# Patient Record
Sex: Female | Born: 1975 | Race: White | Hispanic: No | Marital: Married | Smoking: Never smoker
Health system: Southern US, Community
[De-identification: ages and names within clinical notes are randomized; demographics above are authoritative.]

## PROBLEM LIST (undated history)

## (undated) DIAGNOSIS — F909 Attention-deficit hyperactivity disorder, unspecified type: Secondary | ICD-10-CM

## (undated) DIAGNOSIS — K600 Acute anal fissure: Secondary | ICD-10-CM

## (undated) DIAGNOSIS — Z8052 Family history of malignant neoplasm of bladder: Secondary | ICD-10-CM

## (undated) DIAGNOSIS — G40909 Epilepsy, unspecified, not intractable, without status epilepticus: Secondary | ICD-10-CM

## (undated) DIAGNOSIS — F429 Obsessive-compulsive disorder, unspecified: Secondary | ICD-10-CM

## (undated) DIAGNOSIS — D649 Anemia, unspecified: Secondary | ICD-10-CM

## (undated) DIAGNOSIS — K5904 Chronic idiopathic constipation: Secondary | ICD-10-CM

## (undated) DIAGNOSIS — F419 Anxiety disorder, unspecified: Secondary | ICD-10-CM

## (undated) DIAGNOSIS — F32A Depression, unspecified: Secondary | ICD-10-CM

## (undated) DIAGNOSIS — Z8041 Family history of malignant neoplasm of ovary: Secondary | ICD-10-CM

## (undated) DIAGNOSIS — K602 Anal fissure, unspecified: Principal | ICD-10-CM

## (undated) DIAGNOSIS — K219 Gastro-esophageal reflux disease without esophagitis: Secondary | ICD-10-CM

## (undated) DIAGNOSIS — R569 Unspecified convulsions: Secondary | ICD-10-CM

## (undated) DIAGNOSIS — Z803 Family history of malignant neoplasm of breast: Secondary | ICD-10-CM

## (undated) DIAGNOSIS — K589 Irritable bowel syndrome without diarrhea: Secondary | ICD-10-CM

## (undated) DIAGNOSIS — R630 Anorexia: Secondary | ICD-10-CM

## (undated) HISTORY — DX: Anxiety disorder, unspecified: F41.9

## (undated) HISTORY — DX: Unspecified convulsions: R56.9

## (undated) HISTORY — DX: Obsessive-compulsive disorder, unspecified: F42.9

## (undated) HISTORY — DX: Anal fissure, unspecified: K60.2

## (undated) HISTORY — DX: Family history of malignant neoplasm of ovary: Z80.41

## (undated) HISTORY — DX: Epilepsy, unspecified, not intractable, without status epilepticus: G40.909

## (undated) HISTORY — DX: Gastro-esophageal reflux disease without esophagitis: K21.9

## (undated) HISTORY — PX: TONSILLECTOMY: SUR1361

## (undated) HISTORY — DX: Chronic idiopathic constipation: K59.04

## (undated) HISTORY — DX: Anemia, unspecified: D64.9

## (undated) HISTORY — DX: Anorexia: R63.0

## (undated) HISTORY — DX: Family history of malignant neoplasm of breast: Z80.3

## (undated) HISTORY — DX: Attention-deficit hyperactivity disorder, unspecified type: F90.9

## (undated) HISTORY — DX: Acute anal fissure: K60.0

## (undated) HISTORY — DX: Family history of malignant neoplasm of bladder: Z80.52

## (undated) HISTORY — DX: Irritable bowel syndrome, unspecified: K58.9

## (undated) HISTORY — PX: LIPOSUCTION TRUNK: SUR833

---

## 1999-09-14 ENCOUNTER — Emergency Department (HOSPITAL_COMMUNITY): Admission: EM | Admit: 1999-09-14 | Discharge: 1999-09-14 | Payer: Self-pay | Admitting: Emergency Medicine

## 2001-12-13 ENCOUNTER — Other Ambulatory Visit: Admission: RE | Admit: 2001-12-13 | Discharge: 2001-12-13 | Payer: Self-pay | Admitting: Obstetrics and Gynecology

## 2002-07-20 ENCOUNTER — Inpatient Hospital Stay (HOSPITAL_COMMUNITY): Admission: AD | Admit: 2002-07-20 | Discharge: 2002-07-23 | Payer: Self-pay | Admitting: Obstetrics and Gynecology

## 2002-07-26 ENCOUNTER — Encounter: Admission: RE | Admit: 2002-07-26 | Discharge: 2002-08-25 | Payer: Self-pay | Admitting: Obstetrics and Gynecology

## 2004-09-02 ENCOUNTER — Inpatient Hospital Stay (HOSPITAL_COMMUNITY): Admission: AD | Admit: 2004-09-02 | Discharge: 2004-09-02 | Payer: Self-pay | Admitting: Obstetrics and Gynecology

## 2004-11-26 ENCOUNTER — Inpatient Hospital Stay (HOSPITAL_COMMUNITY): Admission: AD | Admit: 2004-11-26 | Discharge: 2004-11-26 | Payer: Self-pay | Admitting: *Deleted

## 2004-12-01 ENCOUNTER — Ambulatory Visit (HOSPITAL_COMMUNITY): Admission: RE | Admit: 2004-12-01 | Discharge: 2004-12-01 | Payer: Self-pay | Admitting: Neurology

## 2005-03-09 ENCOUNTER — Inpatient Hospital Stay (HOSPITAL_COMMUNITY): Admission: AD | Admit: 2005-03-09 | Discharge: 2005-03-11 | Payer: Self-pay | Admitting: Obstetrics and Gynecology

## 2005-03-12 ENCOUNTER — Encounter: Admission: RE | Admit: 2005-03-12 | Discharge: 2005-03-19 | Payer: Self-pay | Admitting: Obstetrics and Gynecology

## 2006-03-30 ENCOUNTER — Ambulatory Visit: Payer: Self-pay | Admitting: Gastroenterology

## 2006-03-31 ENCOUNTER — Ambulatory Visit: Payer: Self-pay | Admitting: Gastroenterology

## 2006-03-31 ENCOUNTER — Ambulatory Visit (HOSPITAL_COMMUNITY): Admission: RE | Admit: 2006-03-31 | Discharge: 2006-03-31 | Payer: Self-pay | Admitting: Gastroenterology

## 2006-04-06 ENCOUNTER — Ambulatory Visit: Payer: Self-pay | Admitting: Gastroenterology

## 2009-08-15 ENCOUNTER — Inpatient Hospital Stay (HOSPITAL_COMMUNITY): Admission: RE | Admit: 2009-08-15 | Discharge: 2009-08-17 | Payer: Self-pay | Admitting: Obstetrics and Gynecology

## 2010-02-15 ENCOUNTER — Encounter: Payer: Self-pay | Admitting: Neurology

## 2010-04-12 LAB — CBC
HCT: 30.5 % — ABNORMAL LOW (ref 36.0–46.0)
HCT: 30.5 % — ABNORMAL LOW (ref 36.0–46.0)
Hemoglobin: 10.4 g/dL — ABNORMAL LOW (ref 12.0–15.0)
Hemoglobin: 10.6 g/dL — ABNORMAL LOW (ref 12.0–15.0)
MCH: 31.3 pg (ref 26.0–34.0)
MCH: 31.5 pg (ref 26.0–34.0)
MCHC: 34.3 g/dL (ref 30.0–36.0)
MCHC: 34.9 g/dL (ref 30.0–36.0)
MCV: 90.1 fL (ref 78.0–100.0)
MCV: 91.3 fL (ref 78.0–100.0)
Platelets: 186 10*3/uL (ref 150–400)
Platelets: 195 10*3/uL (ref 150–400)
RBC: 3.34 MIL/uL — ABNORMAL LOW (ref 3.87–5.11)
RBC: 3.39 MIL/uL — ABNORMAL LOW (ref 3.87–5.11)
RDW: 12.1 % (ref 11.5–15.5)
RDW: 12.4 % (ref 11.5–15.5)
WBC: 16.4 10*3/uL — ABNORMAL HIGH (ref 4.0–10.5)
WBC: 9.8 10*3/uL (ref 4.0–10.5)

## 2010-04-12 LAB — RPR: RPR Ser Ql: NONREACTIVE

## 2010-05-18 ENCOUNTER — Encounter: Payer: Self-pay | Admitting: Internal Medicine

## 2010-05-18 ENCOUNTER — Inpatient Hospital Stay (HOSPITAL_COMMUNITY)
Admission: EM | Admit: 2010-05-18 | Discharge: 2010-05-19 | DRG: 247 | Disposition: A | Discharge status: Home / Self Care | Payer: BC Managed Care – PPO | Attending: Internal Medicine | Admitting: Internal Medicine

## 2010-05-18 ENCOUNTER — Emergency Department (HOSPITAL_COMMUNITY): Payer: BC Managed Care – PPO

## 2010-05-18 DIAGNOSIS — B343 Parvovirus infection, unspecified: Secondary | ICD-10-CM | POA: Diagnosis present

## 2010-05-18 DIAGNOSIS — R21 Rash and other nonspecific skin eruption: Secondary | ICD-10-CM | POA: Diagnosis present

## 2010-05-18 DIAGNOSIS — M7989 Other specified soft tissue disorders: Secondary | ICD-10-CM | POA: Diagnosis present

## 2010-05-18 DIAGNOSIS — D649 Anemia, unspecified: Secondary | ICD-10-CM | POA: Diagnosis present

## 2010-05-18 DIAGNOSIS — F909 Attention-deficit hyperactivity disorder, unspecified type: Secondary | ICD-10-CM | POA: Diagnosis present

## 2010-05-18 DIAGNOSIS — B349 Viral infection, unspecified: Secondary | ICD-10-CM | POA: Diagnosis present

## 2010-05-18 DIAGNOSIS — M2550 Pain in unspecified joint: Principal | ICD-10-CM | POA: Diagnosis present

## 2010-05-18 DIAGNOSIS — R197 Diarrhea, unspecified: Secondary | ICD-10-CM | POA: Diagnosis present

## 2010-05-18 DIAGNOSIS — R630 Anorexia: Secondary | ICD-10-CM

## 2010-05-18 DIAGNOSIS — K602 Anal fissure, unspecified: Secondary | ICD-10-CM

## 2010-05-18 DIAGNOSIS — F429 Obsessive-compulsive disorder, unspecified: Secondary | ICD-10-CM | POA: Insufficient documentation

## 2010-05-18 DIAGNOSIS — F419 Anxiety disorder, unspecified: Secondary | ICD-10-CM

## 2010-05-18 DIAGNOSIS — F411 Generalized anxiety disorder: Secondary | ICD-10-CM | POA: Diagnosis present

## 2010-05-18 LAB — DIFFERENTIAL
Basophils Absolute: 0 10*3/uL (ref 0.0–0.1)
Basophils Relative: 0 % (ref 0–1)
Eosinophils Absolute: 0.4 10*3/uL (ref 0.0–0.7)
Eosinophils Relative: 5 % (ref 0–5)
Lymphocytes Relative: 27 % (ref 12–46)
Lymphs Abs: 1.9 10*3/uL (ref 0.7–4.0)
Monocytes Absolute: 0.4 10*3/uL (ref 0.1–1.0)
Monocytes Relative: 6 % (ref 3–12)
Neutro Abs: 4.3 10*3/uL (ref 1.7–7.7)
Neutrophils Relative %: 62 % (ref 43–77)
WBC Morphology: INCREASED

## 2010-05-18 LAB — HEPATIC FUNCTION PANEL
ALT: 17 U/L (ref 0–35)
AST: 22 U/L (ref 0–37)
Albumin: 3.2 g/dL — ABNORMAL LOW (ref 3.5–5.2)
Alkaline Phosphatase: 76 U/L (ref 39–117)
Bilirubin, Direct: <0.1 mg/dL (ref 0.0–0.3)
Total Bilirubin: 0.2 mg/dL — ABNORMAL LOW (ref 0.3–1.2)
Total Protein: 6 g/dL (ref 6.0–8.3)

## 2010-05-18 LAB — BASIC METABOLIC PANEL
BUN: 7 mg/dL (ref 6–23)
CO2: 28 mEq/L (ref 19–32)
Calcium: 8 mg/dL — ABNORMAL LOW (ref 8.4–10.5)
Chloride: 106 mEq/L (ref 96–112)
Creatinine, Ser: 0.66 mg/dL (ref 0.4–1.2)
GFR calc Af Amer: >60 mL/min (ref >60)
GFR calc non Af Amer: >60 mL/min (ref >60)
Glucose, Bld: 95 mg/dL (ref 70–99)
Potassium: 3.6 mEq/L (ref 3.5–5.1)
Sodium: 139 mEq/L (ref 135–145)

## 2010-05-18 LAB — RETICULOCYTES
RBC.: 3.73 MIL/uL — ABNORMAL LOW (ref 3.87–5.11)
Retic Count, Absolute: 3.7 10*3/uL — ABNORMAL LOW (ref 19.0–186.0)
Retic Ct Pct: 0.1 % — ABNORMAL LOW (ref 0.4–3.1)

## 2010-05-18 LAB — URINALYSIS, ROUTINE W REFLEX MICROSCOPIC
Bilirubin Urine: NEGATIVE
Glucose, UA: NEGATIVE mg/dL
Hgb urine dipstick: NEGATIVE
Ketones, ur: NEGATIVE mg/dL
Nitrite: NEGATIVE
Protein, ur: NEGATIVE mg/dL
Specific Gravity, Urine: 1.019 (ref 1.005–1.030)
Urobilinogen, UA: 0.2 mg/dL (ref 0.0–1.0)
pH: 6 (ref 5.0–8.0)

## 2010-05-18 LAB — CBC
HCT: 33.1 % — ABNORMAL LOW (ref 36.0–46.0)
Hemoglobin: 11.4 g/dL — ABNORMAL LOW (ref 12.0–15.0)
MCH: 30.2 pg (ref 26.0–34.0)
MCHC: 34.4 g/dL (ref 30.0–36.0)
MCV: 87.8 fL (ref 78.0–100.0)
Platelets: 175 10*3/uL (ref 150–400)
RBC: 3.77 MIL/uL — ABNORMAL LOW (ref 3.87–5.11)
RDW: 12.3 % (ref 11.5–15.5)
WBC: 7 10*3/uL (ref 4.0–10.5)

## 2010-05-18 LAB — PREGNANCY, URINE: Preg Test, Ur: NEGATIVE

## 2010-05-18 LAB — RAPID URINE DRUG SCREEN, HOSP PERFORMED
Amphetamines: POSITIVE — AB
Barbiturates: NOT DETECTED
Benzodiazepines: NOT DETECTED
Cocaine: NOT DETECTED
Opiates: POSITIVE — AB
Tetrahydrocannabinol: NOT DETECTED

## 2010-05-18 LAB — FERRITIN: Ferritin: 58 ng/mL (ref 10–291)

## 2010-05-18 LAB — SEDIMENTATION RATE: Sed Rate: 5 mm/hr (ref 0–22)

## 2010-05-18 LAB — HCG, SERUM, QUALITATIVE: Preg, Serum: NEGATIVE

## 2010-05-18 LAB — APTT: aPTT: 31 seconds (ref 24–37)

## 2010-05-18 LAB — PROTIME-INR
INR: 1.04 (ref 0.00–1.49)
Prothrombin Time: 13.8 seconds (ref 11.6–15.2)

## 2010-05-18 LAB — TSH: TSH: 0.711 u[IU]/mL (ref 0.350–4.500)

## 2010-05-18 LAB — ETHANOL: Alcohol, Ethyl (B): <5 mg/dL (ref 0–10)

## 2010-05-18 IMAGING — CR DG CHEST 2V
2 series · 2 of 2 positions shown · non-contrast
Comparison: None.

CLINICAL DATA: Flu-like symptoms

CHEST - 2 VIEW

[w chest pa]
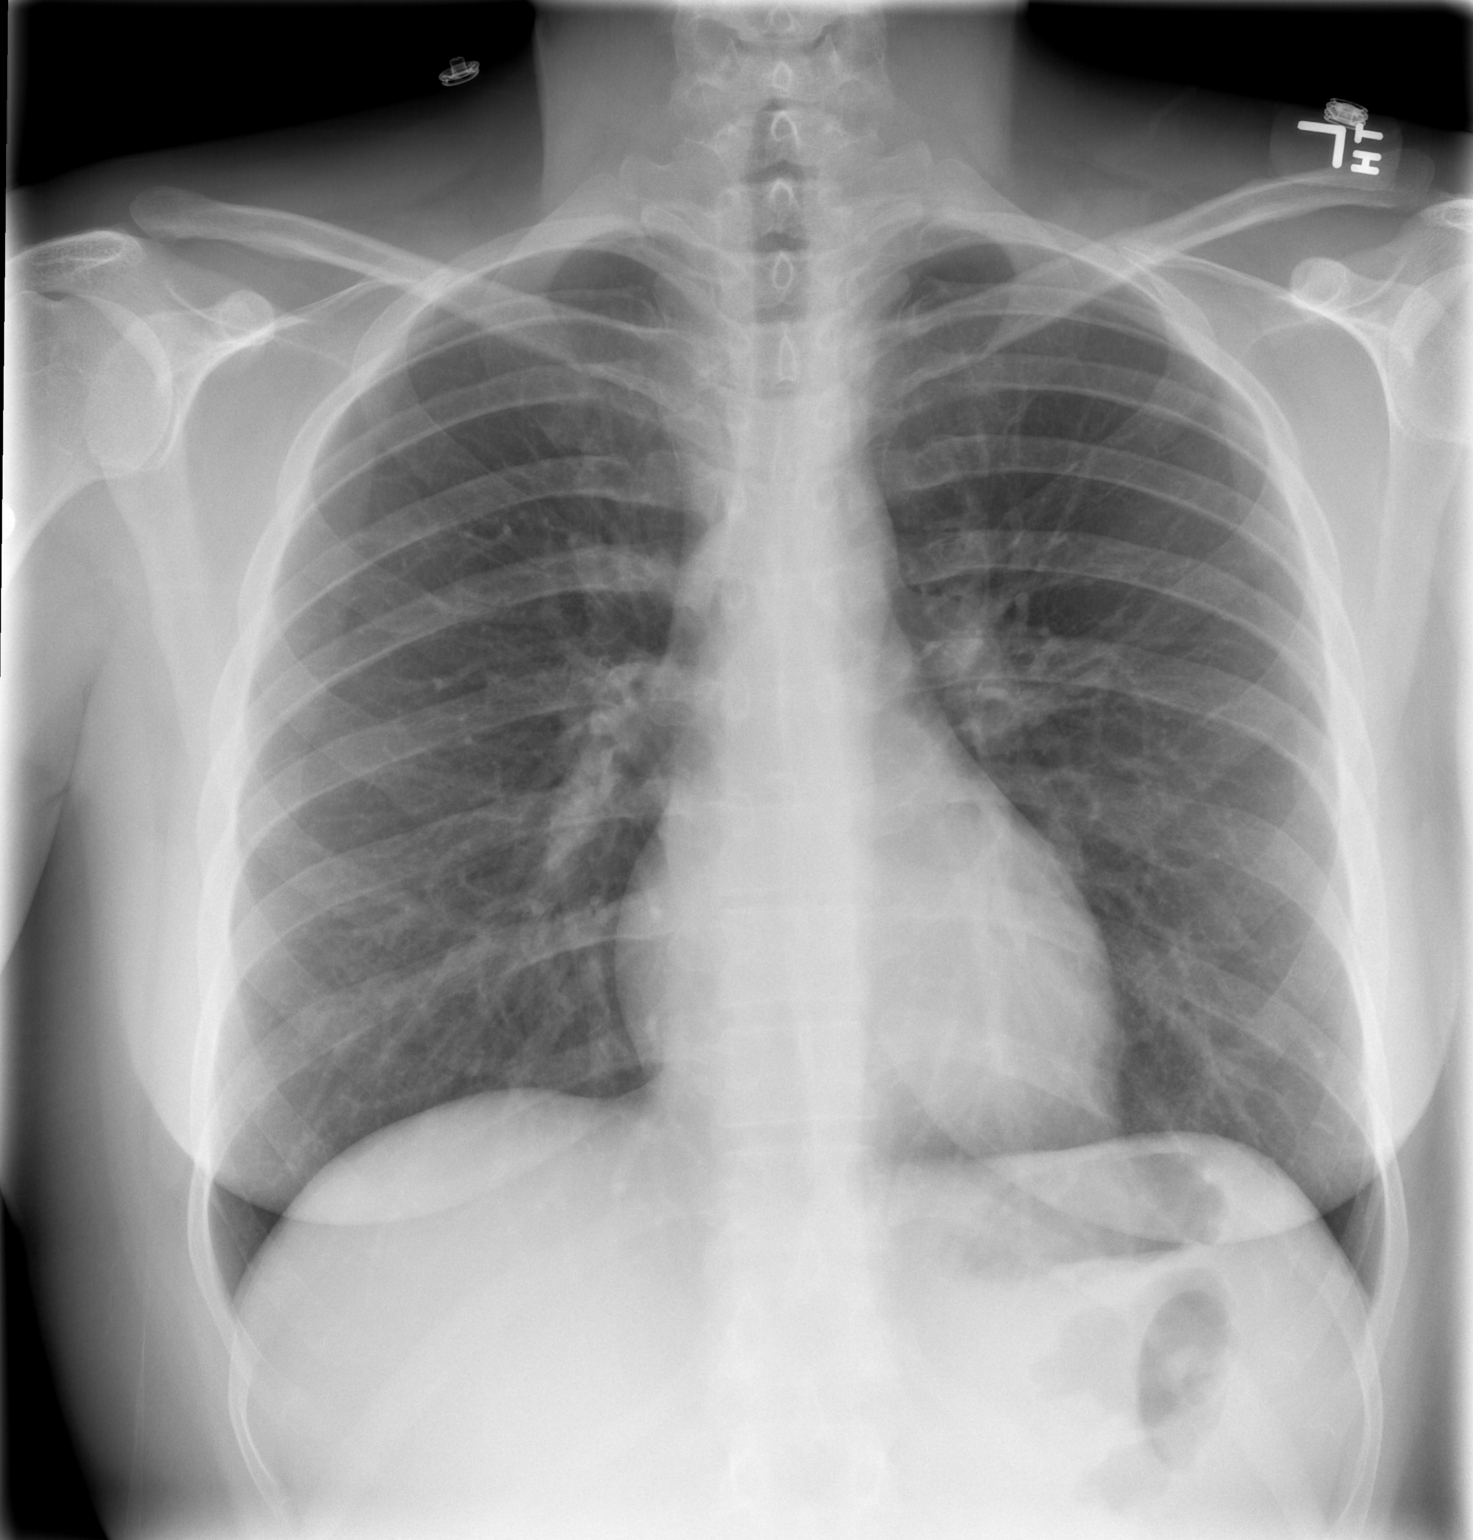

[w chest lat]
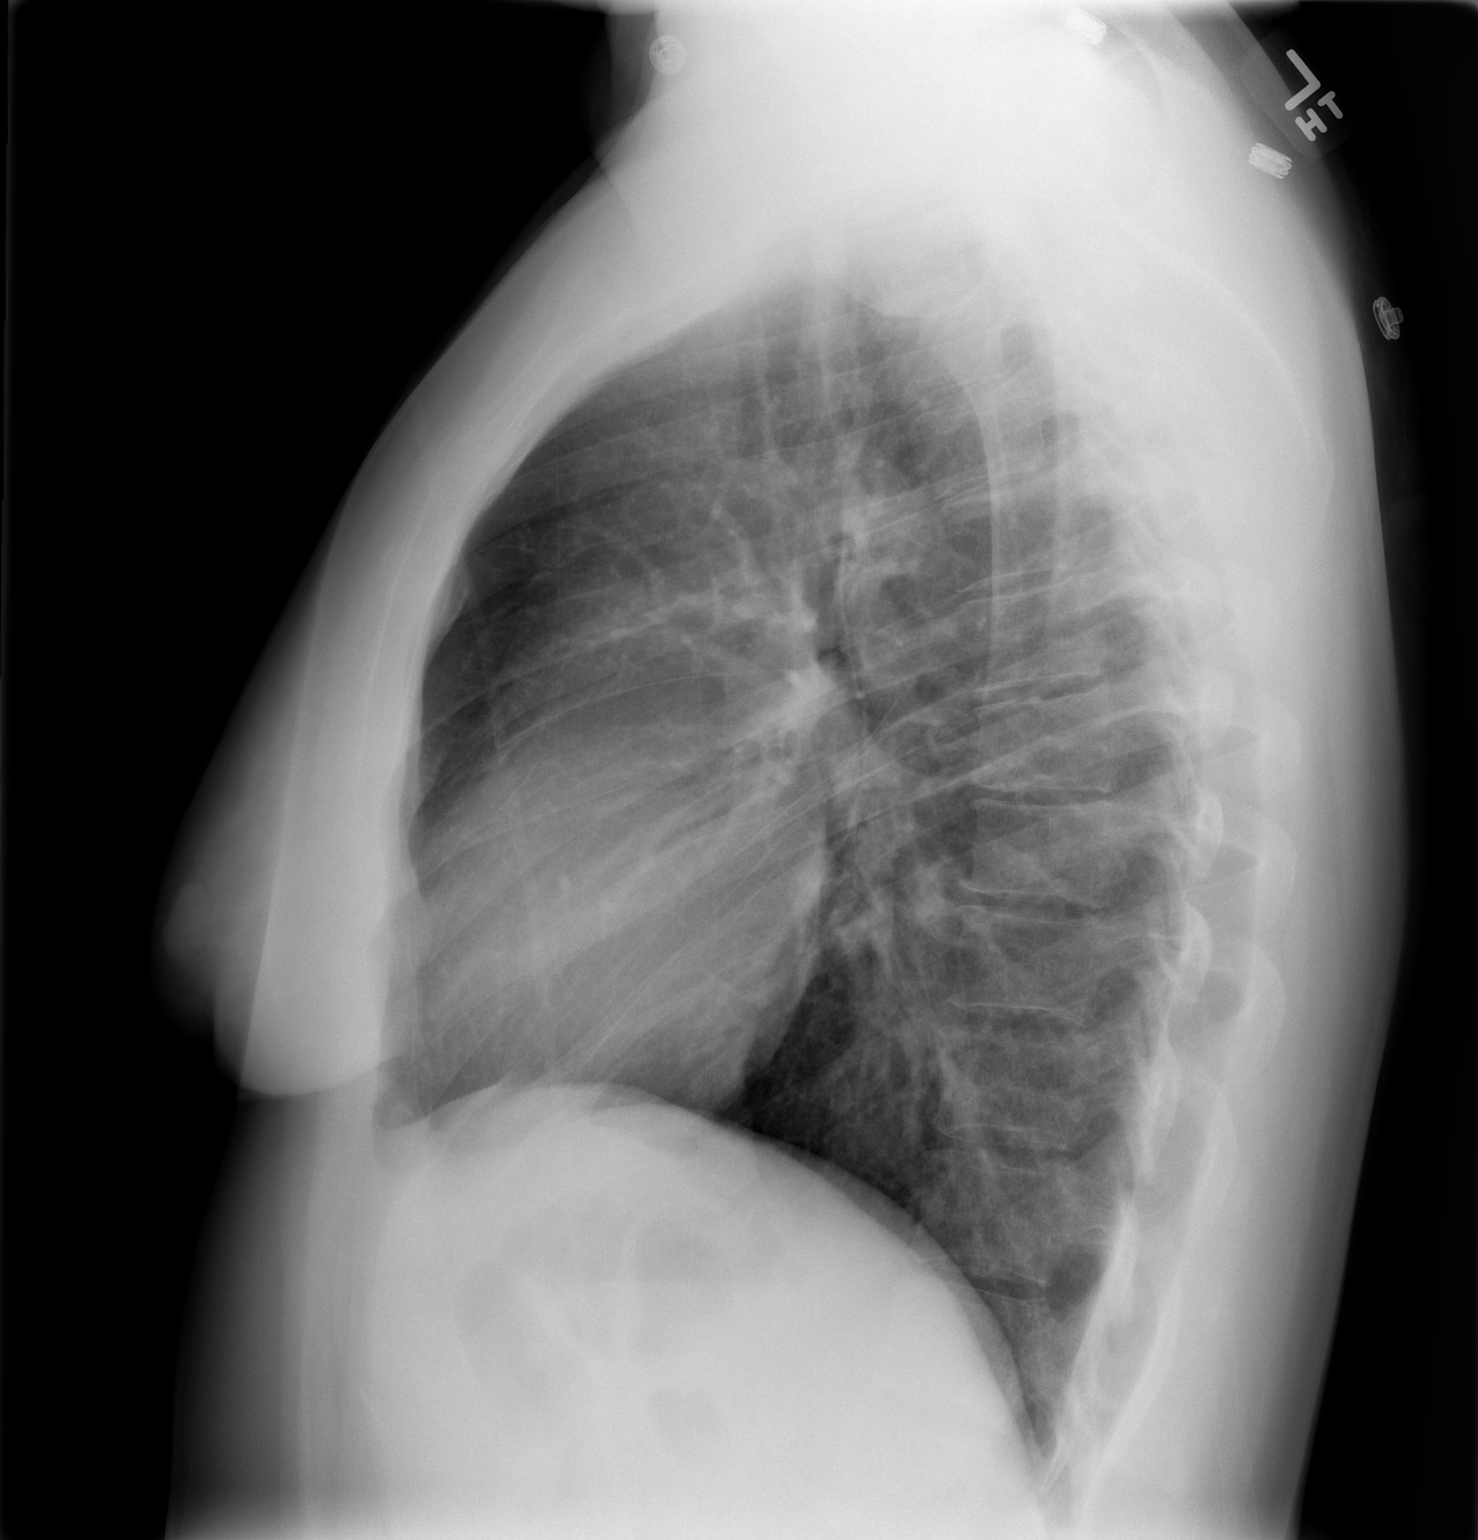

[2 of 2 positions shown; findings below may reference images not displayed]

FINDINGS: Normal heart size.  Clear lungs.
IMPRESSION: Negative.

## 2010-05-18 NOTE — H&P (Signed)
Hospital Admission Note Date of Admission: 05/18/2010  Patient name:   Julia [name]  Medical record number: [id number] Date of birth:  September 04, 1975  Age: 35 y.o. Gender: female PCP:    Darci Needle, M.D.  Medical Service:  Internal Medicine Teaching Service   Attending physician:  Dr. Ulyess Mort  First Contact:   Dr. Saralyn Pilar Pager: [phone]  Second Contact:   Dr. Denton Meek  Pager: [phone] After Hours:    First Contact  Pager: [phone]      Second Contact  Pager: [phone]   Chief Complaint:Painful, swollen hands and feet  History of Present Illness: Patient is a 34yo female with a PMHX of ADHD and anxiety, who presents to [facility] for evaluation of painful arthralgias and swelling of bilateral hands and feet that started on the night prior to admission, and which were preceded by 2 week history of flu-like illness. Illness started as a sore throat, which was followed by diffuse body aches, then onset of watery malodorous diarrhea (occuring 5-6 x daily). Single febrile episode to 100 F occuring 4 days ago. Patient also developed erythematous, "splotchy" pruritic rash over legs and trunk 3 days prior to admission, with resolution of rash on day prior to admission. 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 Patient was not overtly concerned about symptoms, until she developed the painful arthralgias and swelling of bilateral hands and feet on the night before admission, with associated cervical and thoracic spinal pain. Patient further confirms occasional headaches and nausea, but denies chills, vomiting, vision changes, focal weakness, recent travel outside of the country, recent known exposure to ticks. No recent trauma, falls.   Current Outpatient Medications: ALPRAZolam (XANAX) 0.25 MG tablet Take 0.25 mg by mouth at bedtime as needed.     amphetamine-dextroamphetamine  (ADDERALL XR) 30 MG 24 hr capsule Take 30 mg by mouth every morning.     amphetamine-dextroamphetamine (ADDERALL) 30 MG tablet Take 30 mg by mouth daily.       Allergies: Percocet - intolerance, nausea, vomiting  Past Medical History: Diagnosis Date  . Anxiety   . OCD (obsessive compulsive disorder)   . ADHD (attention deficit hyperactivity disorder)   . Anorexia     history of eating disorder    Past Surgical History: Procedure  . Liposuction trunk    Family History: Problem Relation  . Diabetes Mother  . Parkinsonism Mother  . Hypertension Father  . Rheum arthritis Maternal Grandfather    Social History: Social History  . Marital Status: Married    Number of Children: 3  . Years of Education: Completed college   Occupational History  . Full-time homemaker   Social History Main Topics  . Smoking status: Never Smoker   . Alcohol Use: 0.6 oz/week    1 Glasses of wine per week  . Drug Use: No  . Sexually Active: Yes    Birth Control/ Protection: None   Social History Narrative   Married. Has 3 children. A full time homemaker. Graduated from college with a degree in social work.Has a private health insurance through her spouse.    Review of Systems: Pertinent items are noted in HPI.  Vital Signs: T: 98 P: 76 BP: 128/81 RR: 18 O2 sat: 98% on RA   Physical Exam: General: Vital signs reviewed and noted. Well-developed, well-nourished, in no acute distress; alert, appropriate and cooperative throughout examination.  Head: Normocephalic,  atraumatic.  Eyes: PERRL, EOMI, No signs of anemia or jaundince.  Ears: TM nonerythematous, not bulging, good light reflex bilaterally.  Nose: Mucous membranes moist, not inflammed, nonerythematous.  Throat: Oropharynx nonerythematous, no exudate appreciated.   Neck: No deformities, masses, or tenderness noted. Supple, No carotid Bruits, no JVD. Rt posterior cervical   Lungs:  Normal respiratory effort. Clear to  auscultation BL without crackles or wheezes.  Heart: RRR. S1 and S2 normal without gallop, murmur, or rubs.  Abdomen:  BS normoactive. Soft, Nondistended, non-tender.  No masses or organomegaly.  Extremities: No pretibial edema.  Neurologic: A&O X3, CN II - XII are grossly intact. Negative Brudzinski sign.  Skin: Small reticulated, erythematous lesions with irregular borders on right lower lateral abdomen and under left breast approximately 1.5-2 cm in diameter.   Lab results: BMET: Sodium (NA)                              139               135-145          mEq/L Potassium (K)                            3.6               3.5-5.1          mEq/L Chloride                                 106               96-112           mEq/L CO2                                      28                19-32            mEq/L Glucose                                  95                70-99            mg/dL BUN                                      7                 6-23             mg/dL Creatinine                               0.66              0.4-1.2          mg/dL GFR, Est Non African American            >60               >  60              mL/min Calcium                                  8.0        l      8.4-10.5         mg/dL  Hepatic Function Panel: Bilirubin, Total                         0.2        l      0.3-1.2          mg/dL Bilirubin, Direct                        <0.1              0.0-0.3          mg/dL Indirect Bilirubin                       SEE NOTE.         0.3-0.9          mg/dL Alkaline Phosphatase                     76                39-117           U/L SGOT (AST)                               22                0-37             U/L SGPT (ALT)                               17                0-35             U/L Total  Protein                           6.0               6.0-8.3          g/dL Albumin-Blood                            3.2        l      3.5-5.2          G/dL  CBC: WBC                                       7.0               4.0-10.5         K/uL RBC  3.77       l      3.87-5.11        MIL/uL Hemoglobin (HGB)                         11.4       l      12.0-15.0        g/dL Hematocrit (HCT)                         33.1       l      36.0-46.0        % MCV                                      87.8              78.0-100.0       fL MCH -                                    30.2              26.0-34.0        pg MCHC                                     34.4              30.0-36.0        g/dL RDW                                      12.3              11.5-15.5        % Platelet Count (PLT)                     175               150-400          K/uL Neutrophils, %                           62                43-77            % Lymphocytes, %                           27                12-46            % Monocytes, %                             6                 3-12             % Eosinophils, %  5                 0-5              % Basophils, %                             0                 0-1              % Neutrophils, Absolute                    4.3               1.7-7.7          K/uL Lymphocytes, Absolute                    1.9               0.7-4.0          K/uL Monocytes, Absolute                      0.4               0.1-1.0          K/uL Eosinophils, Absolute                    0.4               0.0-0.7          K/uL Basophils, Absolute                      0.0               0.0-0.1          K/uL WBC Morphology                           INCREASED BANDS (>20% BANDS), TOXIC GRANULATION, ATYPICAL LYMPHOCYTES  ESR (Sedimentation Rate)                 5                 0-22             Mm/hr  HCG-Qualitative, Serum                   NEGATIVE          NEG  Alcohol                                  <5                0-10             mg/dL  Coagulation Studies: Protime ( Prothrombin Time)              13.8               11.6-15.2        seconds INR                                      1.04  0.00-1.49 PTT(a-Partial Thromboplastn Time)        31                24-37            Seconds  UA: Color, Urine                             YELLOW            YELLOW Appearance                               CLOUDY     a      CLEAR Specific Gravity                         1.019             1.005-1.030 pH                                       6.0               5.0-8.0 Urine Glucose                            NEGATIVE          NEG              mg/dL Bilirubin                                NEGATIVE          NEG Ketones                                  NEGATIVE          NEG              mg/dL Blood                                    NEGATIVE          NEG Protein                                  NEGATIVE          NEG              mg/dL Urobilinogen                             0.2               0.0-1.0          mg/dL Nitrite                                  NEGATIVE          NEG Leukocytes  NEGATIVE          NEG   Imaging results:  1) CXR - negative.  Assessment & Plan: 1) Painful arthralgias - patient's constellation of symptoms may represent parvovirus infection, particularly with recent exposure per daughter's classmates. Parvovirus infection would be consistent with presenting symptoms of nausea, myalgias, low-grade fever, arthralgias (which are present in 60% of adults with this infection, and typically affect the hands and feet, and present after onset of rash), and recent pruritic rash. Other differentials include Rubella, although pt recalls being uptodate on immunizations through her college career, an ddenies migratory arthralgias. No parotitis, or migratory polyarthritis to suggest mumps infection. No significant swelling or tenderness to palpation over joints, no high fevers to suggest septic arthritis, additionally, pt's symptoms are symmetric. Particularly with her family  history of autoimmune disease (RA), we must also consider entities such as RA and SLE.  SLE would be consistent with symmetric arthralgias of hands, mild anemia, headaches, GI upset perhaps precipitated after recent increased sun exposure. Given that symptoms have been ongoing < 6 weeks and we have not excluded viral arthropathy, RA diagnosis cannot be applied. Vasculitis less likely as ESR within normal limits.   Plan: - Check ANA, HIV Elisa, B. Burgdorferi Elisa, consider check Parvovirus IgM antibody - Throat culture - NSAIDS/ tylenol for pain relief - Monitor CBC for progression of anemia or evidence of thrombocytopenia  2) Diarrhea - unclear etiology, although may represent viral gastroenteritis in setting of #1 vs gastrointestinal manifestation of SLE. Patient otherwise without recent antibiotic usage. - Continue to hydrate patient - Monitor for leukocytosis, fevers - Consider check C.diff   3) Normocytic Anemia - baseline Hgb 10.5-12. May represent iron deficiency anemia with history of symptomatic rectal fissures causing rectal bleeding (has had prior sigmoidoscopy, although results unavailable at this time) versus anemia of chronic disease in setting of possible inflammatory disease.  - Check ferritin - Continue monitor H/H  4) ADHD - without active issues. - Hold home meds at this time, as we further evaluate source of arthralgias, and r/o vasculitis 2/2 methamphetamines.  - Then, will slowly resume home meds.   5) Anxiety - no suicidal or homicidal ideations.  - PRN Xanax  6) DVT PPX: Heparin     Johnette Abraham, D.O. (PGY1):  ____________________________________    Date/ Time:      ____________________________________     Deatra Robinson, M.D. (PGY2):   ____________________________________    Date/ Time:      ____________________________________     I have seen and examined the patient. I reviewed the resident/fellow note and agree with the findings and plan  of care as documented. My additions and revisions are included.   Signature:  ____________________________________________     Internal Medicine Teaching Service Attending    Date:    ____________________________________________

## 2010-05-19 DIAGNOSIS — B349 Viral infection, unspecified: Secondary | ICD-10-CM | POA: Diagnosis present

## 2010-05-19 LAB — ANA: Anti Nuclear Antibody(ANA): NEGATIVE

## 2010-05-19 LAB — CBC
HCT: 31.5 % — ABNORMAL LOW (ref 36.0–46.0)
Hemoglobin: 10.9 g/dL — ABNORMAL LOW (ref 12.0–15.0)
MCH: 30.2 pg (ref 26.0–34.0)
MCHC: 34.6 g/dL (ref 30.0–36.0)
MCV: 87.3 fL (ref 78.0–100.0)
Platelets: 207 10*3/uL (ref 150–400)
RBC: 3.61 MIL/uL — ABNORMAL LOW (ref 3.87–5.11)
RDW: 12 % (ref 11.5–15.5)
WBC: 14.4 10*3/uL — ABNORMAL HIGH (ref 4.0–10.5)

## 2010-05-19 LAB — STREP A DNA PROBE: Group A Strep Probe: NEGATIVE

## 2010-05-19 LAB — BASIC METABOLIC PANEL
BUN: 8 mg/dL (ref 6–23)
CO2: 25 mEq/L (ref 19–32)
Calcium: 8.4 mg/dL (ref 8.4–10.5)
Chloride: 108 mEq/L (ref 96–112)
Creatinine, Ser: 0.6 mg/dL (ref 0.4–1.2)
GFR calc Af Amer: >60 mL/min (ref >60)
GFR calc non Af Amer: >60 mL/min (ref >60)
Glucose, Bld: 135 mg/dL — ABNORMAL HIGH (ref 70–99)
Potassium: 3.6 mEq/L (ref 3.5–5.1)
Sodium: 140 mEq/L (ref 135–145)

## 2010-05-19 LAB — HIV ANTIBODY (ROUTINE TESTING W REFLEX): HIV: NONREACTIVE

## 2010-05-19 NOTE — Discharge Summary (Signed)
HOSPITAL DISCHARGE SUMMARY  DATE OF ADMISSION:        05/18/2010  DATE OF DISCHARGE:        05/19/2010   BRIEF HOSPITAL SUMMARY: Please see dictated discharge summary   STUDIES PENDING AT TIME OF DISCHARGE: ANA, HIV Antibody --> to be followed-up by PCP.   MEDICATIONS ON DISCHARGE: acetaminophen (TYLENOL) 325 MG tablet Take 650 mg by mouth every 6 (six) hours as needed.     ibuprofen (ADVIL,MOTRIN) 400 MG tablet Take 400 mg by mouth every 6 (six) hours as needed. To take with food    ondansetron (ZOFRAN) 4 MG tablet Take 4 mg by mouth every 6 (six) hours as needed.     amphetamine-dextroamphetamine (ADDERALL XR) 30 MG 24 hr capsule Take 30 mg by mouth every morning.     amphetamine-dextroamphetamine (ADDERALL) 30 MG tablet Take 30 mg by mouth daily.

## 2010-05-22 NOTE — Discharge Summary (Signed)
NAMELY, WASS                 ACCOUNT NO.:  [id number]  MEDICAL RECORD NO.:  [id number]           PATIENT TYPE:  I  LOCATION:  5011                         FACILITY:  [facility]  PHYSICIAN:  C. Ulyess Mort, M.D.DATE OF BIRTH:  10-Mar-1975  DATE OF ADMISSION:  05/18/2010 DATE OF DISCHARGE:  05/19/2010                              DISCHARGE SUMMARY   DISCHARGE DIAGNOSES: 1. Viral illness, possibly parvovirus. 2. Diarrhea, stable at discharge, likely viral. 3. Leukocytosis, likely secondary to IV steroids received in ED, the     patient remains afebrile. 4. Normocytic anemia, chronic since 2004. Baseline hemoglobin 10.5-12. Ferritin within normal limits. 5. Attention deficit hyperactivity disorder. 6. Anxiety.  DISCHARGE MEDICATIONS: 1. Acetaminophen 325 mg tabs, 650 mg by mouth every 6 hours as needed. 2. Ibuprofen 400 mg tabs, every 6 hours as needed to be taken with food. 3. Zofran 4 mg tabs, 1 tablet by mouth every 6 hours as needed for nausea or vomiting. 4. Adderall 30 mg tabs by mouth every evening as needed, prescribed by her psychiatrist. 5. Adderall XR 30 mg tabs, 2 tablets by mouth daily, prescribed by her psychiatrist.  DISPOSITION AND FOLLOWUP:   The patient is to follow up with her primary care physician, Dr. Darci Needle on Jun 02, 2010 at 1:30 p.m. at which time, CBC should be checked to evaluate the patient's anemia and evaluate for resolution of her leukocytosis. Further workup of her  normocytic anemia can also be completed, as indicated. Reassessment of presenting symptoms including arthralgias and diarrhea should be performed. Lastly, results of ANA and HIV Antibody tests, which were pending at the time of discharge, should be  refused.   PROCEDURES PERFORMED:   1) Chest x-ray - May 18, 2010 - negative.  CONSULTATIONS:  None.  BRIEF ADMITTING HISTORY AND PHYSICAL:  The patient is a 35 year old female with past medical history of ADHD and anxiety, who  presents to [facility] for evaluation of painful arthralgias and swelling of bilateral hands and feet that started on the night prior to admission and which were proceeded by a 2-week history of flu-like illness. Illness started as a sore throat, which was followed by diffuse body aches and onset of watery malodorous diarrhea occurring 5-6 times daily. Single febrile episode to 100 degrees Fahrenheit occurred 4 days prior to admission.  The patient also developed an erythematous, splotchy, pruritic rash over her legs and trunk 3 days prior to admission with resolution of the rash on the day prior to admission.  The patient attributed her symptoms to Still disease as there was a recent outbreak of this at her daughter's school for which the parents were notified. The patient was not overtly concerned about her symptoms until she developed a painful arthralgias and swelling of her bilateral hands and feet on the night prior to admission with associated cervical and thoracic pain.  The patient further confirmed occasional headaches and nausea, but denied chills, vomiting, visual changes, focal weakness, recent travel outside of the country, recent known exposure to ticks. No recent trauma or falls.  PHYSICAL EXAM ON ADMISSION:   VITAL SIGNS:  Temperature 98, pulse 76, blood pressure 128/81, respirations 18, O2 saturation 98% on room air. GENERAL:  Alert and oriented x3, well developed, well nourished, in no acute distress. HEENT:  Head:  Normocephalic, atraumatic.  Eyes:  PERRL, EOMI.  EARS: TMs nonerythematous, not bulging, light reflex bilaterally.  Mucous membranes moist, not inflamed, nonerythematous.  Throat:  Oropharynx non- erythematous.  No exudates appreciated. NECK:  Right posterior cervical lymphadenopathy noted of a single lymph node less than 1 cm. LUNGS:  Normal respiratory effort.  Clear to auscultation bilaterally. HEART:  Regular rate and rhythm.  No murmurs, rubs, or  gallops. ABDOMEN:  Soft, nontender, nondistended.  Normoactive bowel sounds. EXTREMITIES:  No pretibial edema.  Mild nonpitting edema over left lateral ankle.  NEUROLOGIC:  Alert and oriented x3.  Cranial nerves II through XII grossly intact.  Negative Brudzinski sign. SKIN:  Small reticulated, erythematous lesions with irregular borders on right lower lateral abdomen and under left breast approximately 1.5-2 cm in diameter.  LABS ON ADMISSION:   1) BMET:  Sodium 139, potassium 3.6, chloride 106, bicarb 28, BUN 7, creatinine 0.66, glucose 95.   2) Hepatic function panel: Total bilirubin 0.2, direct bilirubin less than 0.1, alkaline phosphatase 76, AST 22, ALT 17, total protein 6, albumin 3.2.   3) CBC:  WBC 7, hemoglobin 11.4, hematocrit 33.1, MCV 87.8, platelets 12.3, percent neutrophils 62, absolute neutrophils 4.3.  4)  ESR 5.   5) Beta HCG negative. 6) Alcohol less than 5.   7) Coagulation studies normal.   8) Urinalysis normal.  HOSPITAL COURSE BY PROBLEMS: 1. Viral illness, possibly parvovirus infection.  The patient's     constellation of symptoms involving painful arthralgias of the     hands and feet, pruritic rash, myalgias, nausea, low-grade fever     are consistent with viral infection, possibly parvovirus infection.  Exposure may     have been secondary to recent outbreak at her daughter's school.     The patient noted improvement of her pain during the hospital     course.  There was no evidence of high fevers, excessive     leukocytosis, swelling or significant tenderness to palpation of     her joints to suggest a septic arthritis, as well her ESR was     within normal limits.  The patient does have a family history of     autoimmune disease.  Therefore, autoimmune component is possible     with SLE being consistent with symmetric arthralgias of hands, mild     anemia, headaches, and possible GI upset; however, may not have     presented as acutely of the patient's  presenting symptoms.  ANA is     pending at the time of discharge.  The patient did not show     evidence of aplastic anemia during the hospital course, therefore     she was only managed supportively and provided NSAIDs for her     arthralgias.  She will have continued monitoring of her CBC as an     outpatient to assess for such symptoms and laboratory findings. 2. Diarrhea, unclear etiology, although may represent a viral     gastroenteritis in the setting of viral illness versus     gastrointestinal manifestation of SLE.  The patient did not have     significant leukocytosis or fevers on admission.  Labs did not     suggest significant volume depletion.  Therefore, she was just  managed supportively. 3. Normocytic anemia, chronic baseline hemoglobin of 10.5-12.  May represent     an iron-deficiency anemia with history of symptomatic rectal     fissures causing rectal bleeding in the past versus anemia of     chronic disease in the setting of possible inflammatory disease,     versus secondary to menstrual cycle.  Ferritin was found to be     within normal limits.  Reticulocyte count was low, percentage was     0.1 with absolute reticulocyte count of 3.7.  The patient will have     further workup and followup as an outpatient. 4. ADHD without active issue.  We will continue her home medications     on discharge. 5. Anxiety.  No suicidal or homicidal ideations during the hospital     course, was continued on p.r.n. Xanax.  We will follow up with her     outpatient psychiatrist. 6. Leukocytosis.  The patient developed leukocytosis to 14.4 on the     day of discharge.  However, was reflective of IV steroids that she     received during the ED visit.  The patient remained afebrile and no     other sources of infection identified.  She will have a close     followup as an outpatient.  VITAL SIGNS ON DISCHARGE:  T-max 98.4, blood pressure 122/78, pulse 66, respirations 16, O2  saturation 98% on room air.  LABS ON DISCHARGE:   1) CBC:  WBC 14.4, hemoglobin 10.9, hematocrit 31.5, platelets 207.   2) BMET:  Sodium 140, potassium 3.6, chloride 108, bicarb 25, BUN 8, creatinine 0.6, glucose 135.   3) TSH 0.7.    ______________________________ Johnette Abraham, DO   ______________________________ C. Ulyess Mort, M.D.    SK/MEDQ  D:  05/19/2010  T:  05/19/2010  Job:  [id number]  cc:   Brooke Bonito, M.D.  Electronically Signed by Johnette Abraham DO on 05/19/2010 11:52:34 AM Electronically Signed by Eliezer Lofts M.D. on 05/22/2010 02:53:58 PM

## 2010-06-13 NOTE — H&P (Signed)
Julia [name], Julia [name]                 ACCOUNT NO.:  [id number]   MEDICAL RECORD NO.:  [id number]          PATIENT TYPE:  INP   LOCATION:  9163                          FACILITY:  [facility]   PHYSICIAN:  Lenoard Aden, M.D.DATE OF BIRTH:  03-Mar-1975   DATE OF ADMISSION:  03/09/2005  DATE OF DISCHARGE:                                HISTORY & PHYSICAL   CHIEF COMPLAINT:  Presumed macrosomia.   HISTORY OF PRESENT ILLNESS:  She is a 35 year old white female, G2-P1, EDD  March 11, 2005 at 39+ weeks, for induction due to presumed LGA by  ultrasound.   ALLERGIES:  NO KNOWN DRUG ALLERGIES.   MEDICATIONS:  Prenatal vitamins.   PAST OB/GYN HISTORY:  History of spontaneous vaginal delivery of an 8 pound  5 ounce female in 2004.   PAST MEDICAL HISTORY:  1.  OCD, currently on no medications.  2.  History of liposuction.  3.  History of psychiatric hospitalization for anorexia.   FAMILY HISTORY:  Insulin-dependent diabetes and hypertension.   SOCIAL HISTORY:  Non-smoker, non-drinker. Denies domestic physical violence.   PRENATAL LABORATORY DATA:  Blood type A positive. Rh antibody negative.  Rubella immune. Hepatitis and HIV negative. GBS negative. GC and Chlamydia  negative. Pregnancy surveillance otherwise within normal limits. Ultrasound  estimated weight 8-1/2 to 9 pounds.   PHYSICAL EXAMINATION:  GENERAL:  A well developed, well nourished, white  female in no acute distress.  HEENT:  Normal.  LUNGS:  Clear.  HEART:  Regular rhythm.  ABDOMEN:  Soft, gravid, non-tender.  PELVIC:  Cervix is 2 cm, thick vertex, minus 1.  EXTREMITIES:  Show no cords.  NEUROLOGIC:  Examination is non-focal.   IMPRESSION:  1.  A 39 week intrauterine pregnancy.  2.  Presumed macrosomia.   PLAN:  Proceed with induction. Risks and benefits discussed. Epidural as  needed.      Lenoard Aden, M.D.  Electronically Signed     RJT/MEDQ  D:  03/09/2005  T:  03/09/2005  Job:  [id number]

## 2010-06-13 NOTE — Assessment & Plan Note (Signed)
[facility]                         GASTROENTEROLOGY OFFICE NOTE   NAME:Pascale, GOLDIE TREGONING                        MRN:          [id number]  DATE:03/30/2006                            DOB:          [birth year]    PROBLEM:  Hemorrhoids.   Ms. Duignan is a pleasant, 35 year old female referred for evaluation of  above. For the last 3 years she has suffered from severe rectal pain  both with and without defection. She has rectal bleeding as well. She  has tried various medicines without improvement. She moves her bowels  regularly. The pain is throughout the day but clearly worse with  defecation.   PAST MEDICAL HISTORY:  Pertinent for anxiety and panic disorder.   FAMILY HISTORY:  Noncontributory.   MEDICATIONS:  Celexa.   ALLERGIES:  She has no allergies.   SOCIAL HISTORY:  She neither smokes nor drinks. 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   REVIEW OF SYSTEMS:  Reviewed and is negative.   PHYSICAL EXAMINATION:  VITAL SIGNS:  Pulse 100, blood pressure 114/70,  weight 246.  HEENT: EOMI. PERRLA. Sclerae are anicteric.  Conjunctivae are pink.  NECK:  Supple without thyromegaly, adenopathy or carotid bruits.  CHEST:  Clear to auscultation and percussion without adventitious  sounds.  CARDIAC:  Regular rhythm; normal S1 S2.  There are no murmurs, gallops  or rubs.  ABDOMEN:  Bowel sounds are normoactive.  Abdomen is soft, non-tender and  non-distended.  There are no abdominal masses, tenderness, splenic  enlargement or hepatomegaly.  EXTREMITIES:  Full range of motion.  No cyanosis, clubbing or edema.  RECTAL:  She has external skin tags and probable fissures in the  posterior midline.   IMPRESSION:  Symptomatic anal fissures.   RECOMMENDATION:  The patient wishes to proceed directly to  sphincterotomy. Prior to this I will do a sigmoidoscopy and anoscopy  while under sedation for a better examination.    Barbette Hair. Arlyce Dice, MD,FACG  Electronically Signed   RDK/MedQ  DD: 03/30/2006  DT: 03/30/2006  Job #: [id number]   cc:   Corky Sox

## 2010-06-13 NOTE — Letter (Signed)
March 30, 2006    [name]   RE:  BYRON, TIPPING  MRN:  [id number]  /  DOB:  04-Oct-1975   Dear Ms. [name]:   It is my pleasure to have treated you recently as a new patient in my  office.  I appreciate your confidence and the opportunity to participate  in your care.   Since I do have a busy inpatient endoscopy schedule and office schedule,  my office hours vary weekly.  I am, however, available for emergency  calls every day through my office.  If I cannot promptly meet an urgent  office appointment, another one of our gastroenterologists will be able  to assist you.   My well-trained staff are prepared to help you at all times.  For  emergencies after office hours, a physician from our gastroenterology  section is always available through my 24-hour answering service.   While you are under my care, I encourage discussion of your questions  and concerns, and I will be happy to return your calls as soon as I am  available.   Once again, I welcome you as a new patient and I look forward to a happy  and healthy relationship.    Sincerely,      Barbette Hair. Arlyce Dice, MD,FACG  Electronically Signed   RDK/MedQ  DD: 03/30/2006  DT: 03/30/2006  Job #: [id number]

## 2010-06-13 NOTE — Op Note (Signed)
   NAME:  Julia [name], Julia [name]                           ACCOUNT NO.:  [id number]   MEDICAL RECORD NO.:  [id number]                   PATIENT TYPE:  INP   LOCATION:  9164                                 FACILITY:  [facility]   PHYSICIAN:  Lenoard Aden, M.D.             DATE OF BIRTH:  03-01-1975   DATE OF PROCEDURE:  07/21/2002  DATE OF DISCHARGE:                                 OPERATIVE REPORT   OPERATIVE DELIVERY NOTE:   INDICATION FOR OPERATIVE DELIVERY:  Maternal exhaustion.  The patient is  apprised of the risks and benefits of vacuum-assisted delivery to include a  small incidence of cephalohematoma, scalp laceration, and intracranial  hemorrhage.   The Kiwi cup is placed at the proper location, fetal vertex ROA, less than  45 degrees, +3 station.  Three pulls of the Kiwi cup of a full-term living  female over a central median episiotomy.  Nuchal cord x1 reduced on the  perineum.  Mild shoulder dystocia encountered and relieved with suprapubic  pressure.  Apgars 8 and 9.  Placenta delivered spontaneously intact, three-  vessel cord noted.  No vaginal or cervical lacerations noted.  Repair with a  3-0 Vicryl Rapide without complications.  Estimated blood loss 600 mL.  Mother and baby tolerated the procedure well and went to recovery in good  condition.                                               Lenoard Aden, M.D.    RJT/MEDQ  D:  07/21/2002  T:  07/22/2002  Job:  [id number]

## 2010-06-13 NOTE — Procedures (Signed)
MEDICAL RECORD NUMBER:  [id number].   EEG NUMBER:   HISTORY:  This is a 35 year old, who is six months pregnant, who had an  episode of dizziness.  The patient is having an EEG done to evaluate for  possible seizures.   PROCEDURE:  This is a routine EEG.   TECHNICAL DESCRIPTION:  Throughout this routine EEG, there is a posterior  dominant rhythm of 9 Hz activity at 20 to 35 dBuV.  The background activity  is symmetric and mostly comprised of alpha and occasional theta range  activity at 20 to 40 dBuV.  A few times during the tracing, there are some  generalized, large amplitude, delta and theta waves noticed for 1 to 2  seconds.  This could possibly represent a drowsy state of the patient or it  could possibly suggest a lower seizure threshold.  In the background, it is  highly suggestive of a possible drowsy affect.  With photic stimulation,  there is a mild, symmetric, photic driving response.  Hyperventilation  produces a mild, symmetric, slowing response.  The patient does become  drowsy, however, and does not enter Stage 2 sleep.  Throughout this tracing,  there is no definitive electrographic seizures or interictal discharge  activity.   IMPRESSION:  This routine electroencephalogram is essentially normal in the  awake and drowsy states. One should note the few, generalized, higher  amplitude, delta and theta waves noted for 1 to 2 seconds.  They most likely  represent drowsiness.  If clinical suspicion for seizures is high, one would  consider doing a sleep-deprived electroencephalogram to further delineate.           ______________________________  Bevelyn Buckles. Nash Shearer, M.D.     VWU:[id number]  D:  12/01/2004 15:12:49  T:  12/01/2004 21:22:54  Job #:  [id number]

## 2010-06-13 NOTE — Consult Note (Signed)
NAMESHAKENDRA, GRIFFETH                 ACCOUNT NO.:  [id number]   MEDICAL RECORD NO.:  [id number]          PATIENT TYPE:  MAT   LOCATION:  MATC                          FACILITY:  [facility]   PHYSICIAN:  Lenoard Aden, M.D.DATE OF BIRTH:  [birth year]   DATE OF CONSULTATION:  09/02/2004  DATE OF DISCHARGE:                                   CONSULTATION   CHIEF COMPLAINT:  Nausea and vomiting.   HISTORY OF PRESENT ILLNESS:  A 35 year old G2, P1 [redacted] weeks gestation with  nausea, vomiting, pregnancy.   MEDICATIONS:  Zofran and prenatal vitamins.  No other medications.   MEDICAL HISTORY:  Remarkable for anorexia and obsessive compulsive disorder.   FAMILY HISTORY:  Insulin-dependent diabetes and hypertension.   PRENATAL LABORATORIES:  Blood type A+.  HIV, hepatitis negative.  Rubella  immune.   PHYSICAL EXAMINATION:  GENERAL:  She is a well-developed, well-nourished  white female in no acute distress.  HEENT:  Normal.  LUNGS:  Clear.  HEART:  Regular rate and rhythm.  ABDOMEN:  Soft, nontender.  Fetal heart tones noted.  PELVIC:  Cervix is closed, thick, long, presenting part in the pelvis.   IMPRESSION:  1.  12-week OB.  2.  Hyperemesis of pregnancy.   PLAN:  To [facility] for IV fluids, anti-emetic therapy, and probable  discharge pending rehydration status.       RJT/MEDQ  D:  09/02/2004  T:  09/02/2004  Job:  [id number]   cc:   Ma Hillock

## 2010-06-13 NOTE — H&P (Signed)
   NAME:  Julia [name], Julia [name]                           ACCOUNT NO.:  [id number]   MEDICAL RECORD NO.:  [id number]                   PATIENT TYPE:  MAT   LOCATION:  MATC                                 [facility]ILITY:  [facility]   PHYSICIAN:  Lenoard Aden, M.D.             DATE OF BIRTH:  08-13-1975   DATE OF ADMISSION:  07/20/2002  DATE OF DISCHARGE:                                HISTORY & PHYSICAL   CHIEF COMPLAINT:  Macrosomia.   HISTORY OF PRESENT ILLNESS:  A 35 year old white female, gravida 1, para 0,  EDD July 23, 2002, who presents for presumed macrosomia, estimated fetal  weight 9 pounds which is 92nd percentile, for induction in addition to mild  polyhydramnios.   PAST MEDICAL HISTORY:  Remarkable for obsessive compulsive disorder,  currently on Prozac 40 mg a day and prenatal vitamins.  History of anorexia.   FAMILY HISTORY:  Insulin-dependent diabetes mellitus and hypertension.   PRENATAL LABORATORY DATA:  Blood type A positive, rubella immune, hepatitis  and HIV negative.   Pregnancy complicated by intermittent exacerbations of her episodes of  compulsive disorder and size/dates discrepancy with presumed macrosomia,  mild polyhydramnios.   PHYSICAL EXAMINATION:  GENERAL: She is a well-developed, well-nourished,  white female in no acute distress.  HEENT:  Normal.  LUNGS: Clear.  HEART:  Regular rate and rhythm.  ABDOMEN:  Soft, gravid, and nontender.  Estimated fetal weight 9 to 9-1/2  pounds.  PELVIC:  Cervix is 2 cm dilated, thick, vertex -1 to -2.   IMPRESSION:  1. 39-week intrauterine pregnancy.  2. Presumed macrosomia.   PLAN:  Proceed with induction.  Risks of induction versus possible increased  risk of cesarean section discussed.  The patient acknowledges and wishes to  proceed.                                               Lenoard Aden, M.D.    RJT/MEDQ  D:  07/19/2002  T:  07/19/2002  Job:  [id number]

## 2010-06-13 NOTE — H&P (Signed)
Julia [name], [name]                 ACCOUNT NO.:  [id number]   MEDICAL RECORD NO.:  [id number]          PATIENT TYPE:  MAT   LOCATION:  MATC                          [facility]ILITY:  [facility]   PHYSICIAN:  [place] B. Earlene Plater, M.D.  DATE OF BIRTH:  03-Aug-1975   DATE OF ADMISSION:  11/26/2004  DATE OF DISCHARGE:                                HISTORY & PHYSICAL   CHIEF COMPLAINT:  Episode of dizziness and disorientation.   HISTORY OF PRESENT ILLNESS:  This 35 year old white female, gravida 2, para  1, 25+ weeks, presents to maternity admissions after a single episode of  altered mental status.  The patient was sitting at [employer] eating  dinner when she suddenly became dizzy and felt light-headed.  She was in the  middle of telling her husband a story when she suddenly stopped talking and  had a blank facial expression by her husband's report and somewhat droopy  eyes.  Her speech was slurry and disorganized.  The patient was somewhat  disoriented temporarily, although never lost consciousness.  No witnessed  seizure activity and her husband was present throughout the episode.  There  was no bowel or bladder incontinence.  There were no focal signs of weakness  or numbness.  The patient has no history of seizures.  The patient had  associated sensation of feeling dizzy and blurry vision, but did not have  visual sensation of vertigo.  The episode lasted about 10 minutes altogether  and the patient remembers the episode essentially in its entirety.  She had  some residual fatigue which lasted about an hour and has since resolved.  At  this point the patient feels fine and has no complaints.  She has had no  further symptoms since the initial episode which was about two hours ago.   PAST MEDICAL HISTORY:  OCD.   PAST OBSTETRIC HISTORY:  Vaginal delivery x1 without preeclampsia.   PAST SURGICAL HISTORY:  Liposuction.   MEDICATIONS:  Zofran, Phenergan, Zantac, and prenatal vitamin.   ALLERGIES:  None.   SOCIAL HISTORY:  No alcohol, tobacco, or drugs.  However, the patient does  report the use of recreational drugs in college several years ago.   REVIEW OF SYSTEMS:  Otherwise negative.   PHYSICAL EXAMINATION:  GENERAL:  The patient is alert and oriented, no acute  distress.  VITAL SIGNS:  Blood pressure ranges from 120s to 130s over 70s to 80s.  Fetal heart tones in the 140s with no decelerations.  It is reassuring given  the patient's gestational age.  NEUROLOGIC:  The patient has normal motor function, upper and lower  extremities, and has intact sensation to light touch throughout.  Deep  tendon reflexes are trace in upper and lower extremities.  Toes are  downgoing.  Cranial nerves are intact.  Funduscopy shows normal-appearing  optic discs, no evidence of AV nicking or retinal hemorrhage.   LABORATORY DATA:  Complete blood count and complete metabolic panel are  within normal limits, including uric acid, which is also normal.   ASSESSMENT:  Transient episode of altered level of consciousness.  The  patient is completely alert and oriented at this point and has no focal  neurological signs and normal vital signs and laboratory values.  Differential diagnoses discussed including hypoglycemic episode,  hypotension, or other neurological events such as possible petit mal  seizure.  Discussed with the patient that her episode seems less consistent  with hypoglycemia in that she was eating at the time and hypotension, given  the prolonged nature of the symptoms.  Given the patient is completely back  to normal at this point and has no focal neurological signs, I feel she is  stable for discharge but will need close follow-up with neurology.  Our  office will arrange this in the morning and call her with an appointment to  be scheduled within the next 1-2 days.  The patient is instructed in the  meantime that should her symptoms recur, she should present to [facility]  or  [facility].      Gerri Spore B. Earlene Plater, M.D.  Electronically Signed     WBD/MEDQ  D:  11/26/2004  T:  11/26/2004  Job:  [id number]

## 2010-09-29 ENCOUNTER — Encounter (HOSPITAL_COMMUNITY): Payer: Self-pay | Admitting: *Deleted

## 2010-09-29 ENCOUNTER — Inpatient Hospital Stay (HOSPITAL_COMMUNITY)
Admission: AD | Admit: 2010-09-29 | Discharge: 2010-09-29 | Disposition: A | Discharge status: Home / Self Care | Payer: BC Managed Care – PPO | Source: Ambulatory Visit | Attending: Obstetrics & Gynecology | Admitting: Obstetrics & Gynecology

## 2010-09-29 DIAGNOSIS — F988 Other specified behavioral and emotional disorders with onset usually occurring in childhood and adolescence: Secondary | ICD-10-CM | POA: Insufficient documentation

## 2010-09-29 DIAGNOSIS — N816 Rectocele: Secondary | ICD-10-CM | POA: Insufficient documentation

## 2010-09-29 DIAGNOSIS — N393 Stress incontinence (female) (male): Secondary | ICD-10-CM | POA: Insufficient documentation

## 2010-09-29 DIAGNOSIS — F411 Generalized anxiety disorder: Secondary | ICD-10-CM | POA: Insufficient documentation

## 2010-09-29 DIAGNOSIS — F429 Obsessive-compulsive disorder, unspecified: Secondary | ICD-10-CM | POA: Insufficient documentation

## 2010-09-29 HISTORY — DX: Obsessive-compulsive disorder, unspecified: F42.9

## 2010-09-29 NOTE — Progress Notes (Signed)
Pt states she has a history of rectal fissures from her first delivery. Pt has been constipated and used rectal supp's today. Had a huge BM then felt something coming out. Had her husband look and it appears to be coming out of the meatus. Having vaginal and rectal pain.

## 2010-09-29 NOTE — Progress Notes (Signed)
Pt reports that she was constipated for one week. Had suppositories today. Pt states that she had a BM around 1400, then @ 1700 notice some "tissue"  protruding from her vagina  and has notice some brown discharge today.

## 2010-09-29 NOTE — ED Provider Notes (Signed)
History  Julia [name] 35 y.o. G3P3  LMP normal early August 2012     Chief Complaint  Patient presents with  . Vaginal Pain    -  Protrusion from vagina since pushed on a hard stool this PM  HPI:  Had pain from constipation.  Finally succeeded passing a hard stool and got worried after when saw something protruding from vagina.  Mild SUI.  Anal fissures.  No UTI Sx.  ROS neg.  Pertinent Gynecological History: Menses: flow is moderate Bleeding: None Contraception: none, ok if conceives DES exposure: denies Blood transfusions: none Sexually transmitted diseases: no past history Previous GYN Procedures: none  Last mammogram: none Last pap: normal   Past Medical History  Diagnosis Date  . Anxiety   . OCD (obsessive compulsive disorder)   . ADHD (attention deficit hyperactivity disorder)   . Anorexia     history of eating disorder  . Obsessive compulsive disorder   . Obsessive compulsive disorder     Past Surgical History  Procedure Date  . Liposuction trunk     Family History  Problem Relation Age of Onset  . Diabetes Mother   . Parkinsonism Mother   . Hypertension Father   . Stroke Father   . Rheum arthritis Maternal Grandfather     History  Substance Use Topics  . Smoking status: Never Smoker   . Smokeless tobacco: Not on file  . Alcohol Use: 0.6 oz/week    1 Glasses of wine per week    Allergies:  Allergies  Allergen Reactions  . Percocet (Oxycodone-Acetaminophen) Nausea Only    Prescriptions prior to admission  Medication Sig Dispense Refill  . acetaminophen (TYLENOL) 325 MG tablet Take 650 mg by mouth every 6 (six) hours as needed.        Marland Kitchen amphetamine-dextroamphetamine (ADDERALL XR) 30 MG 24 hr capsule Take 30 mg by mouth every morning.        Marland Kitchen amphetamine-dextroamphetamine (ADDERALL) 30 MG tablet Take 30 mg by mouth daily.        Marland Kitchen ibuprofen (ADVIL,MOTRIN) 400 MG tablet Take 400 mg by mouth every 6 (six) hours as needed. To take with food         . ondansetron (ZOFRAN) 4 MG tablet Take 4 mg by mouth every 6 (six) hours as needed.           Physical Exam   Blood pressure 129/81, pulse 97, temperature 98.7 F (37.1 C), temperature source Oral, resp. rate 20, height 6' (1.829 m), weight 93.532 kg (206 lb 3.2 oz), last menstrual period 08/29/2010, SpO2 99.00%, unknown if currently breastfeeding.  Vaginal exam:  Valsalva with Rectocele grade 1/3.                          Minimal Cystocele and no uterine prolapse.  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         Recommendation to treat constipation, avoid chronic cough and limit lifting.         Reassured.  Will f/u in office with Dr Billy Coast.  Observation vs Rectocele repair discussed.          Pt also mentioned mild SUI and anal fissures that she would like to have repaired at same time as Rectocele.  Genia Del  MD 09/29/2010  8:50 pm

## 2013-11-27 ENCOUNTER — Encounter (HOSPITAL_COMMUNITY): Payer: Self-pay | Admitting: *Deleted

## 2014-05-17 DIAGNOSIS — R569 Unspecified convulsions: Secondary | ICD-10-CM

## 2014-05-17 HISTORY — DX: Unspecified convulsions: R56.9

## 2014-05-22 ENCOUNTER — Telehealth: Payer: Self-pay | Admitting: Neurology

## 2014-05-22 NOTE — Telephone Encounter (Signed)
This patient was traveling in the [place] over the last several days, suffered a generalized tonic-clonic seizure. The seizure lasted about 3 minutes. A CT scan of the brain was reportedly unremarkable. The patient however, has a residual left hemiparesis. The patient will require an urgent workup, Dr. Clelia CroftShaw will order MRI of the brain with and without gadolinium. We'll try to get her worked in through this office in the next day or 2.  7 years ago,the patient had a staring episode that could've represented a seizure. No problem since that time.

## 2014-05-23 ENCOUNTER — Encounter: Payer: Self-pay | Admitting: Diagnostic Neuroimaging

## 2014-05-23 ENCOUNTER — Other Ambulatory Visit: Payer: Self-pay | Admitting: *Deleted

## 2014-05-23 ENCOUNTER — Ambulatory Visit (INDEPENDENT_AMBULATORY_CARE_PROVIDER_SITE_OTHER): Payer: BLUE CROSS/BLUE SHIELD | Admitting: Diagnostic Neuroimaging

## 2014-05-23 VITALS — BP 122/80 | HR 91 | Ht 73.0 in | Wt 204.1 lb

## 2014-05-23 DIAGNOSIS — G40909 Epilepsy, unspecified, not intractable, without status epilepticus: Secondary | ICD-10-CM

## 2014-05-23 DIAGNOSIS — R569 Unspecified convulsions: Secondary | ICD-10-CM

## 2014-05-23 MED ORDER — LACOSAMIDE 50 MG PO TABS: 50.0000 mg | ORAL_TABLET | Freq: Two times a day (BID) | ORAL | Status: DC

## 2014-05-23 MED ORDER — LACOSAMIDE 100 MG PO TABS: 100.0000 mg | ORAL_TABLET | Freq: Two times a day (BID) | ORAL | Status: DC

## 2014-05-23 NOTE — Patient Instructions (Signed)
Start vimpat 50mg  twice a day for 2 weeks, then increase to 100mg  twice a day.  No driving until seizure free x 6 months according to [place] law.    - According to [place] law, you can not drive unless you are seizure free for at least 6 months and under physician's care.   - Please maintain seizure precautions. Do not participate in activities where a loss of awareness could harm you or someone else. No swimming alone, no tub bathing, no hot tubs, no driving, no operating motorized vehicles (cars, ATVs, motocycles, etc), lawnmowers or power tools. No standing at heights, such as rooftops, ladders or stairs. Avoid hot objects such as stoves, heaters, open fires. Wear a helmet when riding a bicycle, scooter, skateboard, etc. and avoid areas of traffic. Set your water heater to 120 degrees or less.

## 2014-05-23 NOTE — Progress Notes (Addendum)
[facility]  PATIENT: Julia [name] DOB: Aug 15, 1975  REFERRING CLINICIAN: Dione Housekeeper HISTORY FROM: patient and husband  REASON FOR VISIT: new consult    HISTORICAL  CHIEF COMPLAINT:  Chief Complaint  Patient presents with  . New Evaluation    possible seizure     HISTORY OF PRESENT ILLNESS:   39 year old right-handed female with history of ADHD, OCD, anxiety, head trauma as a child in 1983, migraine, anxiety, here for evaluation of seizure.  Patient was on vacation in [place] 0/86/76, at the resort check encounter, when all of a sudden she had a sense of dj vu. Her husband witnessed her developed a blank look, stiffness of all extremities, convulsions, pale and then dark appearance, falling to the ground striking her head onto the marble floor. She had tongue biting, generalized convulsions for 5 minutes. She was confused, and gradually recovered over 30 minutes. She was about a by the resort physician who recommended emergency room evaluation. Patient rested for the rest of the day and then went to the emergency room the next day. Patient had CT of the head and neck which were unremarkable. She had significant dizziness, weakness, malaise over the next few days, finally returning to the [place] a few days later. She continues to have left arm numbness/weakness and left leg weakness.  Patient had an episode of possible complex partial or petit mall seizure in 2006. At that time she was in her second trimester pregnancy. She was at dinner with her husband and family, when all of a sudden she had a blank stare, slumped over, memory lapse and confusion.  Patient has had intermittent episodes of olfactory hallucinations for past 6 years, where she senses a smell of something rotten or bad smelling.  No episodes of temporal or spatial distortion. No other convulsive episodes.  Patient had episode of head trauma at age 52 years old when she fell out of a second  story sliding glass door, falling onto concrete and gravel. She was admitted to the hospital in intensive care unit, critically ill, and apparently was not expected to survive. Fortunately she recovered fully after a few weeks.    REVIEW OF SYSTEMS: Full 14 system review of systems performed and notable only for a spinning sensation trouble swallowing blurred vision joint pain joint swelling aching muscles anxiety racing thoughts difficult swallowing dizziness seizure passing out tremor or loss confusion headache numbness weakness.  ALLERGIES: Allergies  Allergen Reactions  . Percocet [Oxycodone-Acetaminophen] Nausea Only    HOME MEDICATIONS: Outpatient Prescriptions Prior to Visit  Medication Sig Dispense Refill  . acetaminophen (TYLENOL) 325 MG tablet Take 650 mg by mouth every 6 (six) hours as needed.     . ALPRAZolam (XANAX) 1 MG tablet Take 1 mg by mouth daily as needed. For anxiety     . amphetamine-dextroamphetamine (ADDERALL XR) 25 MG 24 hr capsule Take 50 mg by mouth every morning.      Marland Kitchen amphetamine-dextroamphetamine (ADDERALL) 30 MG tablet Take 30 mg by mouth daily.     Marland Kitchen FLUoxetine (PROZAC) 40 MG capsule Take 80 mg by mouth daily.      Marland Kitchen ibuprofen (ADVIL,MOTRIN) 400 MG tablet Take 400 mg by mouth every 6 (six) hours as needed. To take with food    . Glycerin, Laxative, (GLYCERIN ADULT) 3 G SUPP Place 1-2 suppositories rectally daily as needed. For constipation     . magnesium hydroxide (MILK OF MAGNESIA) 800 MG/5ML suspension Take 5 mLs by mouth daily  as needed. For constipation      No facility-administered medications prior to visit.    PAST MEDICAL HISTORY: Past Medical History  Diagnosis Date  . Anxiety   . OCD (obsessive compulsive disorder)   . ADHD (attention deficit hyperactivity disorder)   . Anorexia     history of eating disorder  . Obsessive compulsive disorder   . Obsessive compulsive disorder     PAST SURGICAL HISTORY: Past Surgical History    Procedure Laterality Date  . Liposuction trunk      FAMILY HISTORY: Family History  Problem Relation Age of Onset  . Diabetes Mother   . Parkinsonism Mother   . Hypertension Father   . Stroke Father   . Rheum arthritis Maternal Grandfather     SOCIAL HISTORY:  History   Social History  . Marital Status: Married    Spouse Name: N/A  . Number of Children: N/A  . Years of Education: N/A   Occupational History  . Not on file.   Social History Main Topics  . Smoking status: Never Smoker   . Smokeless tobacco: Not on file  . Alcohol Use: 0.6 oz/week    1 Glasses of wine per week  . Drug Use: No  . Sexual Activity: Yes    Birth Control/ Protection: None   Other Topics Concern  . Not on file   Social History Narrative   Married. Has 3 children. A full time homemaker. Graduated from college with a degree in social work.   Has a private health insurance through her spouse.     PHYSICAL EXAM  Filed Vitals:   05/23/14 0909  BP: 122/80  Pulse: 91  Height: 6' 1" (1.854 m)  Weight: 204 lb 1.6 oz (92.579 kg)    Body mass index is 26.93 kg/(m^2).   Visual Acuity Screening   Right eye Left eye Both eyes  Without correction:     With correction: 20/20 20/20     No flowsheet data found.  GENERAL EXAM: Patient is in no distress; well developed, nourished and groomed; neck is supple; RIGHT PERI-ORBITAL BRUISING  CARDIOVASCULAR: Regular rate and rhythm, no murmurs, no carotid bruits  NEUROLOGIC: MENTAL STATUS: awake, alert, oriented to person, place and time, recent and remote memory intact, normal attention and concentration, language fluent, comprehension intact, naming intact, fund of knowledge appropriate CRANIAL NERVE: no papilledema on fundoscopic exam, pupils equal and reactive to light, visual fields full to confrontation, extraocular muscles intact, no nystagmus, facial sensation and strength symmetric, hearing intact, palate elevates symmetrically, uvula  midline, shoulder shrug symmetric, tongue midline. MOTOR: normal bulk and tone, full strength in the BUE, BLE SENSORY: normal and symmetric to light touch, pinprick, temperature, vibration  COORDINATION: finger-nose-finger, fine finger movements normal REFLEXES: deep tendon reflexes present and symmetric GAIT/STATION: narrow based gait; able to walk on toes, heels and tandem; romberg is negative    DIAGNOSTIC DATA (LABS, IMAGING, TESTING) - I reviewed patient records, labs, notes, testing and imaging myself where available.  Lab Results  Component Value Date   WBC 14.4* 05/19/2010   HGB 10.9* 05/19/2010   HCT 31.5* 05/19/2010   MCV 87.3 05/19/2010   PLT 207 05/19/2010      Component Value Date/Time   NA 140 05/19/2010 0500   K 3.6 05/19/2010 0500   CL 108 05/19/2010 0500   CO2 25 05/19/2010 0500   GLUCOSE 135* 05/19/2010 0500   BUN 8 05/19/2010 0500   CREATININE 0.60 05/19/2010 0500  CALCIUM 8.4 05/19/2010 0500   PROT 6.0 05/18/2010 1220   ALBUMIN 3.2* 05/18/2010 1220   AST 22 05/18/2010 1220   ALT 17 05/18/2010 1220   ALKPHOS 76 05/18/2010 1220   BILITOT 0.2* 05/18/2010 1220   GFRNONAA >60 05/19/2010 0500   GFRAA  05/19/2010 0500    >60        The eGFR has been calculated using the MDRD equation. This calculation has not been validated in all clinical situations. eGFR's persistently <60 mL/min signify possible Chronic Kidney Disease.   No results found for: CHOL, HDL, LDLCALC, LDLDIRECT, TRIG, CHOLHDL No results found for: HGBA1C No results found for: VITAMINB12 Lab Results  Component Value Date   TSH 0.711 05/18/2010    05/18/14 CT HEAD AND CERVICAL SPINE (D.R. Images and reports reviewed) - no acute findings    ASSESSMENT AND PLAN  39 y.o. year old female here with new onset generalized convulsive seizure on 05/17/14. Possible complex partial or petit mal seizure in 2006. Intermittent episodes of olfactory hallucinations and remote history of head  trauma in 1983. 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 Had long conversation with patient and husband about diagnosis, prognosis, treatment options. I recommend starting antiseizure medication. Patient has MRI of the brain scheduled for later today. I will check EEG and start Vimpat.  PLAN: - MRI brain - EEG  - vimpat 34m BID x 2 weeks, then 1068mBID - agree with tapering wellbutrin off - seizure precautions and education reviewed - no driving until seizure free x 6 months as per [place] law  Orders Placed This Encounter  Procedures  . EEG adult   Meds ordered this encounter  Medications  . lacosamide (VIMPAT) 50 MG TABS tablet    Sig: Take 1 tablet (50 mg total) by mouth 2 (two) times daily.    Dispense:  60 tablet    Refill:  3  . Lacosamide (VIMPAT) 100 MG TABS    Sig: Take 1 tablet (100 mg total) by mouth 2 (two) times daily.    Dispense:  60 tablet    Refill:  5   Return in about 6 weeks (around 07/04/2014).  I reviewed images, labs, notes, records myself. I summarized findings and reviewed with patient, for this high risk condition (new onset seizure disorder) requiring high complexity decision making.   VIPenni BombardMD 04/[birth year]/[id number]:76M Certified in Neurology, Neurophysiology and Neuroimaging  [facility] [address]SuMcGregorrHydesville[place] [id number] [phone]

## 2014-05-24 ENCOUNTER — Telehealth: Payer: Self-pay | Admitting: Diagnostic Neuroimaging

## 2014-05-24 ENCOUNTER — Telehealth: Payer: Self-pay | Admitting: Neurology

## 2014-05-24 NOTE — Telephone Encounter (Signed)
Patient called following up on her MRI results. She had the MRI yesterday. Please call and advice C/b [phone]

## 2014-05-24 NOTE — Telephone Encounter (Signed)
Results of MRI the cervical spine shows mild cervical disc bulges with neuroforaminal encroachment at the C3 and C4 levels bilaterally. MRI of the brain shows no significant abnormalities. This was done with and without contrast. Study was done at [facility].

## 2014-05-25 ENCOUNTER — Telehealth: Payer: Self-pay | Admitting: Diagnostic Neuroimaging

## 2014-05-25 NOTE — Telephone Encounter (Signed)
Called and spoke with the pt after speaking with Dr. Marjory LiesPenumalli and seeing the note in the chart from Dr. Anne HahnWillis about the MRI. Relayed that the MRI of the brain was normal and the MRI of the cervical spine showed slight bulging but nothing that Dr. Marjory LiesPenumalli was concerned with or would do anything about. She stated a thanks and an understanding. Also mentioned to her that Dr. Marjory LiesPenumalli said she could try PT as well if she felt like she needed it.

## 2014-05-25 NOTE — Telephone Encounter (Signed)
i called patient and reviewed results again. Nothing acute. Her left side numbness is improving. Will follow up after EEG. -VRP

## 2014-05-29 ENCOUNTER — Telehealth: Payer: Self-pay | Admitting: Diagnostic Neuroimaging

## 2014-05-29 NOTE — Telephone Encounter (Signed)
I called back and spoke with patient.  Says since she began taking Vimpat she has noticed several bruises and cannot recall bumping into anything or any reason she should have bruises.  As well, says she got a bit more sun than needed while on vacation, and her skin is peeling.  Indicates since she started Vimpat, where she is peeling has been very itchy, which had not occurred prior to taking med.  Other than slight fatigue, denies any other symptoms.  Patient would like a message sent to provider, questioning if he feels this could be due to the medication.  Says she has an EEG scheduled tomorrow, so she is okay with waiting until tomorrow for an answer.  Please advise.  Thank you.

## 2014-05-29 NOTE — Telephone Encounter (Signed)
Patient called stating that she  Is having bruising all over her body and thinks it might be a reaction from lacosamide (VIMPAT) 50 MG TABS tablet. Please call and advice # [phone]

## 2014-05-29 NOTE — Telephone Encounter (Signed)
I called patient. She has noticed some bruising, itching, peeling on her skin. ? vimpat related vs sunburn vs trauma/seizure related. She doesn't think they are hives. No swelling or facial rash or swallow diff.  She will monitor. If it worsens significantly, then she will go to PCP or ER for eval.   -VRP

## 2014-05-30 ENCOUNTER — Telehealth: Payer: Self-pay | Admitting: Neurology

## 2014-05-30 ENCOUNTER — Ambulatory Visit (INDEPENDENT_AMBULATORY_CARE_PROVIDER_SITE_OTHER): Payer: BLUE CROSS/BLUE SHIELD | Admitting: Neurology

## 2014-05-30 DIAGNOSIS — G40909 Epilepsy, unspecified, not intractable, without status epilepticus: Secondary | ICD-10-CM | POA: Diagnosis not present

## 2014-05-30 NOTE — Procedures (Signed)
     History: Julia [name] is a 39 year old patient with a history of a generalized seizure on 05/17/2014. The patient has had a residual left-sided numbness and weakness following the seizure. The patient may have had a complex partial seizure associated with blank staring and slumping over in 2006. The patient being evaluated for seizures.  This is a routine EEG. No skull defects are noted. Medications include alprazolam, Adderall, Wellbutrin, Prozac, and Vimpat.  EEG classification: Dysrhythmia grade 1 generalized; Delta grade 1 left greater than right frontotemporal  Description of the recording: The background rhythms of this recording consists of a moderately well modulated medium amplitude theta frequency rhythm of 7 Hz that is reactive to eye opening and closure. As the record progresses, there appears to be a generalized background 7 Hz slowing seen throughout the recording. Intermittently, there are episodes of generalized bursts of 2-3 Hz slowing that has a frontotemporal predominance lasting 1-2 seconds. These episodes appear to be a bit more prominent on the left than the right at times, and at other times are more symmetric. Photic stimulation is performed, this results in a bilateral and symmetric photic response. Hyperventilation is also performed, and this results in a minimal buildup of the background rhythm activities with a mild increase in background slowing seen. At no time during the recording does there appear to be actual spike or spike-wave discharges. EKG monitor shows no evidence of cardiac rhythm abnormalities with a heart rate of 78.  Impression: This is an abnormal EEG recording secondary to diffuse generalized background slowing, but there are episodes of paroxysmal delta slowing that at times appears to be more prominent in the left frontotemporal area. This study suggests a lowered seizure threshold, with some predominance of abnormalities in the left hemisphere. Clinical  correlation is required.

## 2014-05-30 NOTE — Telephone Encounter (Signed)
I called the patient. The EEG shows parts is mobile delta activity that could represent an interictal pattern, surprisingly, the abnormalities appear to be more prominent on the left brain than the right, which does not correlate with the clinical event.

## 2014-06-18 ENCOUNTER — Telehealth: Payer: Self-pay | Admitting: Neurology

## 2014-06-18 NOTE — Telephone Encounter (Signed)
Patient is calling in regard to Rx Vimpat 50mg  2 x day. She is having terrible headaches and nauseous.  She is also having an impulsive jerks several times per day(10 to 15?) and would like to talk with her doctor. Although she has seen Dr. Anne HahnWillis she is requesting Dr. Marjory LiesPenumalli whom she has also seen call her.  Thanks!

## 2014-06-19 NOTE — Telephone Encounter (Signed)
Spoke to the pt on the phone after talking with her and she was able to come in at 1130. i will double book her appt and i asked her to get her at 1140 am. She is aware. She thanked me for being so prompt

## 2014-06-19 NOTE — Telephone Encounter (Signed)
Patient called today following up with message left on 5/23. She has not received a response. Please call and advise.  Patient can be reached @ [phone].

## 2014-06-19 NOTE — Telephone Encounter (Signed)
Spoke to the pt on the phone, she told me that she stopped taking the vimpat on Monday and she is feeling much better, the headache and the nausea have resolved and she feels much better. I asked Dr. Marjory LiesPenumalli if he wanted me to work the pt in this week and he told me to try to get her in at 1230 pm on Thursday. The pt states that she is unable to do that due to her sons preschool graduation. i told her I would talk to Dr. Demetrius CharityP and get back with her about an appt date and time, she thanked me

## 2014-06-20 ENCOUNTER — Telehealth: Payer: Self-pay | Admitting: *Deleted

## 2014-06-20 ENCOUNTER — Ambulatory Visit (INDEPENDENT_AMBULATORY_CARE_PROVIDER_SITE_OTHER): Payer: BLUE CROSS/BLUE SHIELD | Admitting: Diagnostic Neuroimaging

## 2014-06-20 ENCOUNTER — Ambulatory Visit: Payer: Self-pay | Admitting: Diagnostic Neuroimaging

## 2014-06-20 ENCOUNTER — Encounter: Payer: Self-pay | Admitting: Diagnostic Neuroimaging

## 2014-06-20 VITALS — BP 123/83 | HR 98 | Ht 73.0 in | Wt 210.2 lb

## 2014-06-20 DIAGNOSIS — G40109 Localization-related (focal) (partial) symptomatic epilepsy and epileptic syndromes with simple partial seizures, not intractable, without status epilepticus: Secondary | ICD-10-CM | POA: Insufficient documentation

## 2014-06-20 DIAGNOSIS — G40209 Localization-related (focal) (partial) symptomatic epilepsy and epileptic syndromes with complex partial seizures, not intractable, without status epilepticus: Secondary | ICD-10-CM | POA: Diagnosis not present

## 2014-06-20 MED ORDER — CARBAMAZEPINE ER 200 MG PO CP12: 200.0000 mg | ORAL_CAPSULE | Freq: Two times a day (BID) | ORAL | Status: DC

## 2014-06-20 NOTE — Progress Notes (Signed)
[facility]  PATIENT: Julia [name] DOB: 08-Apr-1975  REFERRING CLINICIAN: Dione Housekeeper HISTORY FROM: patient and husband  REASON FOR VISIT: follow up    HISTORICAL  CHIEF COMPLAINT:  Chief Complaint  Patient presents with  . Follow-up    seizure disorder and needing to talk about new medications     HISTORY OF PRESENT ILLNESS:   UPDATE 06/20/14: Since last visit, started vimpat 58m BID, then within a few days had increasing headaches and fatigue, malaise, pain in eyes, nausea. Tried it for 4 weeks, then stopped on 06/17/14. Now feeling better. No seizures. Some intermittent muscle jerks since I last saw her.   PRIOR HPI (05/23/14): 39year old right-handed female with history of ADHD, OCD, anxiety, head trauma as a child in 1983, migraine, anxiety, here for evaluation of seizure. [place] check encounter, when all of a sudden she had a sense of dj vu. Her husband witnessed her developed a blank look, stiffness of all extremities, convulsions, pale and then dark appearance, falling to the ground striking her head onto the marble floor. She had tongue biting, generalized convulsions for 5 minutes. She was confused, and gradually recovered over 30 minutes. She was about a by the resort physician who recommended emergency room evaluation. Patient rested for the rest of the day and then went to the emergency room the next day. Patient had CT of the head and neck which were unremarkable. She had significant dizziness, weakness, malaise over the next few days, [place]. She continues to have left arm numbness/weakness and left leg weakness. Patient had an episode of possible complex partial or petit mall seizure in 2006. At that time she was in her second trimester pregnancy. She was at dinner with her husband and family, when all of a sudden she had a blank stare, slumped over, memory  lapse and confusion. Patient has had intermittent episodes of olfactory hallucinations for past 6 years, where she senses a smell of something rotten or bad smelling. No episodes of temporal or spatial distortion. No other convulsive episodes. Patient had episode of head trauma at age [age]years old when she fell out of a second story sliding glass door, falling onto concrete and gravel. She was admitted to the hospital in intensive care unit, critically ill, and apparently was not expected to survive. Fortunately she recovered fully after a few weeks.   REVIEW OF SYSTEMS: Full 14 system review of systems performed and notable only for as per HPI.   ALLERGIES: Allergies  Allergen Reactions  . Percocet [Oxycodone-Acetaminophen] Nausea Only    HOME MEDICATIONS: Outpatient Prescriptions Prior to Visit  Medication Sig Dispense Refill  . acetaminophen (TYLENOL) 325 MG tablet Take 650 mg by mouth every 6 (six) hours as needed.     . ALPRAZolam (XANAX) 1 MG tablet Take 1 mg by mouth daily as needed. For anxiety     . amphetamine-dextroamphetamine (ADDERALL XR) 25 MG 24 hr capsule Take 50 mg by mouth every morning.      .Marland KitchenFLUoxetine (PROZAC) 40 MG capsule Take 80 mg by mouth daily.      .Marland Kitchenibuprofen (ADVIL,MOTRIN) 400 MG tablet Take 400 mg by mouth every 6 (six) hours as needed. To take with food    . amphetamine-dextroamphetamine (ADDERALL) 30 MG tablet Take 30 mg by mouth daily.     .Marland KitchenbuPROPion (WELLBUTRIN XL) 150 MG 24 hr tablet  1  . Lacosamide (VIMPAT) 100 MG TABS Take 1 tablet (100 mg total) by mouth 2 (two) times daily. 60 tablet 5  . lacosamide (VIMPAT) 50 MG TABS tablet Take 1 tablet (50 mg total) by mouth 2 (two) times daily. 60 tablet 3   No facility-administered medications prior to visit.    PAST MEDICAL HISTORY: Past Medical History  Diagnosis Date  . Anxiety   . OCD (obsessive compulsive disorder)   . ADHD (attention deficit hyperactivity disorder)   . Anorexia     history of  eating disorder  . Obsessive compulsive disorder   . Obsessive compulsive disorder     PAST SURGICAL HISTORY: Past Surgical History  Procedure Laterality Date  . Liposuction trunk      FAMILY HISTORY: Family History  Problem Relation Age of Onset  . Diabetes Mother   . Parkinsonism Mother   . Hypertension Father   . Stroke Father   . Rheum arthritis Maternal Grandfather     SOCIAL HISTORY:  History   Social History  . Marital Status: Married    Spouse Name: N/A  . Number of Children: N/A  . Years of Education: N/A   Occupational History  . Not on file.   Social History Main Topics  . Smoking status: Never Smoker   . Smokeless tobacco: Not on file  . Alcohol Use: 0.6 oz/week    1 Glasses of wine per week  . Drug Use: No  . Sexual Activity: Yes    Birth Control/ Protection: None   Other Topics Concern  . Not on file   Social History Narrative   Married. Has 3 children. A full time homemaker. Graduated from college with a degree in social work.   Has a private health insurance through her spouse.     PHYSICAL EXAM  Filed Vitals:   06/20/14 1302  BP: 123/83  Pulse: 98  Height: _0  (1.854 m)  Weight: 210 lb 3.2 oz (95.346 kg)    Body mass index is 27.74 kg/(m^2).  No exam data present  No flowsheet data found.   GENERAL EXAM: Patient is in no distress; well developed, nourished and groomed; neck is supple  CARDIOVASCULAR: Regular rate and rhythm, no murmurs, no carotid bruits  NEUROLOGIC: MENTAL STATUS: awake, alert, language fluent, comprehension intact, naming intact, fund of knowledge appropriate CRANIAL NERVE: no papilledema on fundoscopic exam, pupils equal and reactive to light, visual fields full to confrontation, extraocular muscles intact, no nystagmus, facial sensation and strength symmetric, hearing intact, palate elevates symmetrically, uvula midline, shoulder shrug symmetric, tongue midline. MOTOR: normal bulk and tone, full  strength in the BUE, BLE SENSORY: normal and symmetric to light touch COORDINATION: finger-nose-finger, fine finger movements normal REFLEXES: deep tendon reflexes present and symmetric GAIT/STATION: narrow based gait; able to walk tandem; romberg is negative    DIAGNOSTIC DATA (LABS, IMAGING, TESTING) - I reviewed patient records, labs, notes, testing and imaging myself where available.  Lab Results  Component Value Date   WBC 14.4* 05/19/2010   HGB 10.9* 05/19/2010   HCT 31.5* 05/19/2010   MCV 87.3 05/19/2010   PLT 207 05/19/2010      Component Value Date/Time   NA 140 05/19/2010 0500   K 3.6 05/19/2010 0500   CL 108 05/19/2010 0500   CO2 25 05/19/2010 0500   GLUCOSE 135* 05/19/2010 0500   BUN 8 05/19/2010 0500   CREATININE 0.60 05/19/2010 0500   CALCIUM 8.4 05/19/2010 0500   PROT 6.0 05/18/2010  1220   ALBUMIN 3.2* 05/18/2010 1220   AST 22 05/18/2010 1220   ALT 17 05/18/2010 1220   ALKPHOS 76 05/18/2010 1220   BILITOT 0.2* 05/18/2010 1220   GFRNONAA >60 05/19/2010 0500   GFRAA  05/19/2010 0500    >60        The eGFR has been calculated using the MDRD equation. This calculation has not been validated in all clinical situations. eGFR's persistently <60 mL/min signify possible Chronic Kidney Disease.   No results found for: CHOL, HDL, LDLCALC, LDLDIRECT, TRIG, CHOLHDL No results found for: HGBA1C No results found for: VITAMINB12 Lab Results  Component Value Date   TSH 0.711 05/18/2010    05/18/14 CT HEAD AND CERVICAL SPINE (D.R. Images and reports reviewed) - no acute findings  05/23/14 MRI cervical spine - shows mild cervical disc bulges with neuroforaminal encroachment at the C3 and C4 levels bilaterally.   05/23/14 MRI brain - no significant abnormalities  05/30/14 EEG - secondary to diffuse generalized background slowing, but there are episodes of paroxysmal delta slowing that at times appears to be more prominent in the left frontotemporal area. This study  suggests a lowered seizure threshold, with some predominance of abnormalities in the left hemisphere. Clinical correlation is required.    ASSESSMENT AND PLAN  39 y.o. year old female here with new onset generalized convulsive seizure on 05/17/14. Possible complex partial or petit mal seizure in 2006. Intermittent episodes of olfactory hallucinations and remote history of head trauma in 1983. 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 EEG shows focal and generalized slowing, some of which could relate back to her head trauma and skull fracture from age 57 years old.   Had long conversation with patient and husband about diagnosis, prognosis, treatment options. I recommend starting antiseizure medication. She is not tolerating Vimpat. Will try carbamazepine instead.   PLAN: - stop vimpat - start CBZ ER 240m BID x 2 weeks; then increase to 4017mBID - seizure precautions and education reviewed - no driving until seizure free x 6 months as per [place] law  Meds ordered this encounter  Medications  . carbamazepine (EQUETRO) 200 MG CP12 12 hr capsule    Sig: Take 1-2 capsules (200-400 mg total) by mouth 2 (two) times daily.    Dispense:  60 each    Refill:  6   Return in about 6 weeks (around 08/01/2014).  I spent 15 minutes of face to face time with patient. Greater than 50% of time was spent in counseling and coordination of care with patient.     VIPenni BombardMD 05/30/63/03541:6:56M Certified in Neurology, Neurophysiology and Neuroimaging  [facility] [address] [phone]

## 2014-06-20 NOTE — Telephone Encounter (Signed)
Called [facility] on [address] and asked them to send a disc and a paper copy of this pts MRI from 05/23/14. I told them our telephone number, our fax and the address. If they need any further information, please get a detailed message about what exactly they need.

## 2014-06-20 NOTE — Patient Instructions (Signed)
Try carbamazepine ER 200mg  twice a day for 2 weeks then increase to 400mg  twice a day.

## 2014-06-20 NOTE — Telephone Encounter (Signed)
Spoke to the pt on the phone, was able to get her moved to the 1 pm slot due to a cancellation. She thanked me

## 2014-06-21 ENCOUNTER — Telehealth: Payer: Self-pay | Admitting: Diagnostic Neuroimaging

## 2014-06-21 NOTE — Telephone Encounter (Signed)
I contacted the pharmacy.  Spoke with Dorene GrebeNatalie.  For some reason they were trying to process this Rx as brand name instead on Carbamazepine.  OV note says: - start CBZ ER 200mg  BID x 2 weeks; then increase to 400mg  BID.  Ins does not pay for brand name, however, they indicate Carbamazepine is covered.  They will dispense this as sent and call us back if there are any issues.  I called the patient back to advise. She is aware and will folow up with the pharmacy.

## 2014-06-21 NOTE — Telephone Encounter (Signed)
Patient called stating that her insurance, [employer] will not cover the script for carbamazepine (EQUETRO) 200 MG CP12 12 hr capsule. She wants to know what she can take to replace that. Please call and advise. Patient can be reached @ [phone]

## 2014-06-27 ENCOUNTER — Telehealth: Payer: Self-pay | Admitting: Diagnostic Neuroimaging

## 2014-06-27 NOTE — Telephone Encounter (Signed)
I called patient check on status of carbamazepine. No answer. -VRP

## 2014-06-28 NOTE — Telephone Encounter (Signed)
Called and spoke with the pt checking on the carbamazepine and how she was tolerating it. She told me it is doing much better for her, the migraines have almost stopped (she had 2 this week), she is having some nausea (which she states is tolerable) and she stated that when she is in the sun she gets red very quickly. I told her that nausea is a common side effect and that staying out of the sun would be advisable for her on this medication. She told me that she was willing to keep the medication going and continue to see how it worked for her. She thanked me for calling to check on her

## 2014-07-04 ENCOUNTER — Ambulatory Visit: Payer: BLUE CROSS/BLUE SHIELD | Admitting: Diagnostic Neuroimaging

## 2014-07-08 ENCOUNTER — Emergency Department (HOSPITAL_COMMUNITY)
Admission: EM | Admit: 2014-07-08 | Discharge: 2014-07-08 | Disposition: A | Discharge status: Home / Self Care | Payer: BLUE CROSS/BLUE SHIELD | Attending: Emergency Medicine | Admitting: Emergency Medicine

## 2014-07-08 ENCOUNTER — Encounter (HOSPITAL_COMMUNITY): Payer: Self-pay | Admitting: *Deleted

## 2014-07-08 ENCOUNTER — Emergency Department (HOSPITAL_COMMUNITY): Payer: BLUE CROSS/BLUE SHIELD

## 2014-07-08 DIAGNOSIS — F419 Anxiety disorder, unspecified: Secondary | ICD-10-CM | POA: Insufficient documentation

## 2014-07-08 DIAGNOSIS — F909 Attention-deficit hyperactivity disorder, unspecified type: Secondary | ICD-10-CM | POA: Insufficient documentation

## 2014-07-08 DIAGNOSIS — R109 Unspecified abdominal pain: Secondary | ICD-10-CM | POA: Diagnosis not present

## 2014-07-08 DIAGNOSIS — F42 Obsessive-compulsive disorder: Secondary | ICD-10-CM | POA: Diagnosis not present

## 2014-07-08 DIAGNOSIS — Z3202 Encounter for pregnancy test, result negative: Secondary | ICD-10-CM | POA: Insufficient documentation

## 2014-07-08 DIAGNOSIS — K648 Other hemorrhoids: Secondary | ICD-10-CM | POA: Diagnosis not present

## 2014-07-08 DIAGNOSIS — Z79899 Other long term (current) drug therapy: Secondary | ICD-10-CM | POA: Insufficient documentation

## 2014-07-08 DIAGNOSIS — K625 Hemorrhage of anus and rectum: Secondary | ICD-10-CM | POA: Insufficient documentation

## 2014-07-08 LAB — URINALYSIS, ROUTINE W REFLEX MICROSCOPIC
Glucose, UA: NEGATIVE mg/dL
Hgb urine dipstick: NEGATIVE
Ketones, ur: NEGATIVE mg/dL
Leukocytes, UA: NEGATIVE
Nitrite: NEGATIVE
Protein, ur: NEGATIVE mg/dL
Specific Gravity, Urine: 1.03 (ref 1.005–1.030)
Urobilinogen, UA: 0.2 mg/dL (ref 0.0–1.0)
pH: 6 (ref 5.0–8.0)

## 2014-07-08 LAB — COMPREHENSIVE METABOLIC PANEL
ALT: 11 U/L — ABNORMAL LOW (ref 14–54)
AST: 18 U/L (ref 15–41)
Albumin: 3.6 g/dL (ref 3.5–5.0)
Alkaline Phosphatase: 78 U/L (ref 38–126)
Anion gap: 6 (ref 5–15)
BUN: 11 mg/dL (ref 6–20)
CO2: 28 mmol/L (ref 22–32)
Calcium: 8.5 mg/dL — ABNORMAL LOW (ref 8.9–10.3)
Chloride: 103 mmol/L (ref 101–111)
Creatinine, Ser: 0.83 mg/dL (ref 0.44–1.00)
GFR calc Af Amer: >60 mL/min (ref >60)
GFR calc non Af Amer: >60 mL/min (ref >60)
Glucose, Bld: 92 mg/dL (ref 65–99)
Potassium: 4.1 mmol/L (ref 3.5–5.1)
Sodium: 137 mmol/L (ref 135–145)
Total Bilirubin: 0.4 mg/dL (ref 0.3–1.2)
Total Protein: 6.3 g/dL — ABNORMAL LOW (ref 6.5–8.1)

## 2014-07-08 LAB — CBC
HCT: 33.6 % — ABNORMAL LOW (ref 36.0–46.0)
Hemoglobin: 11.1 g/dL — ABNORMAL LOW (ref 12.0–15.0)
MCH: 27.6 pg (ref 26.0–34.0)
MCHC: 33 g/dL (ref 30.0–36.0)
MCV: 83.6 fL (ref 78.0–100.0)
Platelets: 264 10*3/uL (ref 150–400)
RBC: 4.02 MIL/uL (ref 3.87–5.11)
RDW: 14.5 % (ref 11.5–15.5)
WBC: 5.2 10*3/uL (ref 4.0–10.5)

## 2014-07-08 LAB — I-STAT BETA HCG BLOOD, ED (MC, WL, AP ONLY): I-stat hCG, quantitative: <5 m[IU]/mL (ref <5)

## 2014-07-08 LAB — PROTIME-INR
INR: 1.02 (ref 0.00–1.49)
Prothrombin Time: 13.6 seconds (ref 11.6–15.2)

## 2014-07-08 LAB — ABO/RH: ABO/RH(D): A POS

## 2014-07-08 LAB — POC OCCULT BLOOD, ED: Fecal Occult Bld: POSITIVE — AB

## 2014-07-08 LAB — TYPE AND SCREEN
ABO/RH(D): A POS
Antibody Screen: NEGATIVE

## 2014-07-08 IMAGING — CT CT ABD-PELV W/ CM
2 of 4 series · 16 of 46 positions shown, 18 images · IV contrast (Omni 300)
Comparison: None.

CLINICAL DATA: Rectal bleeding after bowel movement this morning.

EXAM:
CT ABDOMEN AND PELVIS WITH CONTRAST
TECHNIQUE: Multidetector CT imaging of the abdomen and pelvis was performed
using the standard protocol following bolus administration of
intravenous contrast.
CONTRAST:  100mL OMNIPAQUE IOHEXOL 300 MG/ML  SOLN

[Series 2: abd/ pelvis 5.0 i30f 1 · axial · 0.81mm/px · z∈[+691,+1156]mm · 13 of 103 slices shown, 15 images]
[im 5/103  soft-tissue]
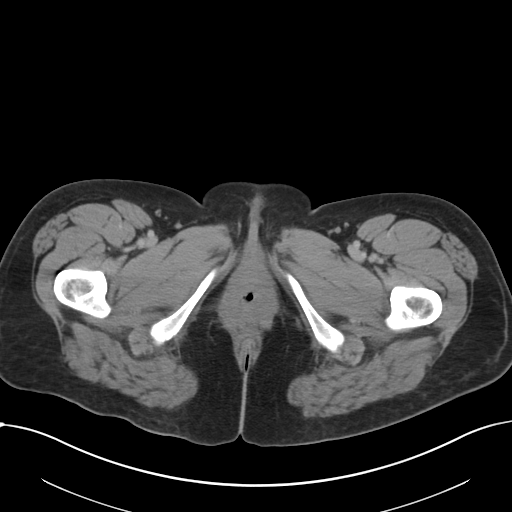
[im 5/103  bone]
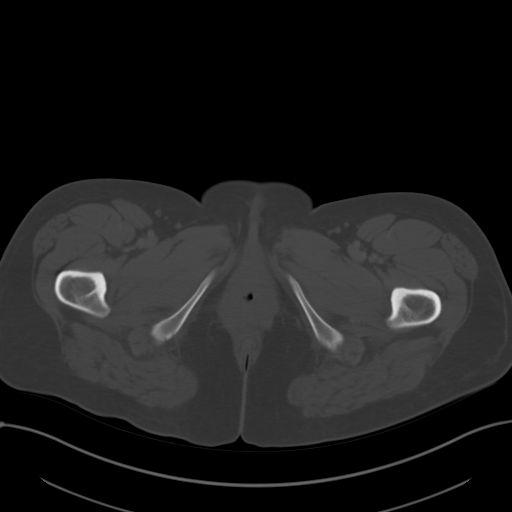
[im 13/103  soft-tissue]
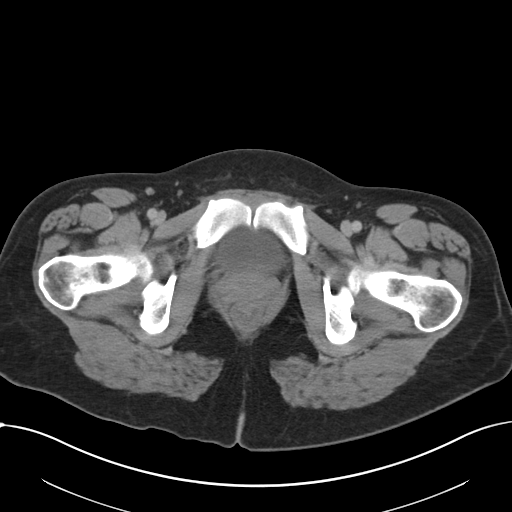
[im 22/103  soft-tissue]
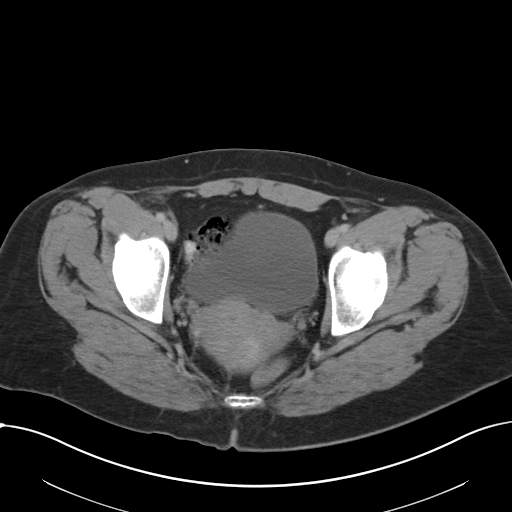
[im 30/103  soft-tissue]
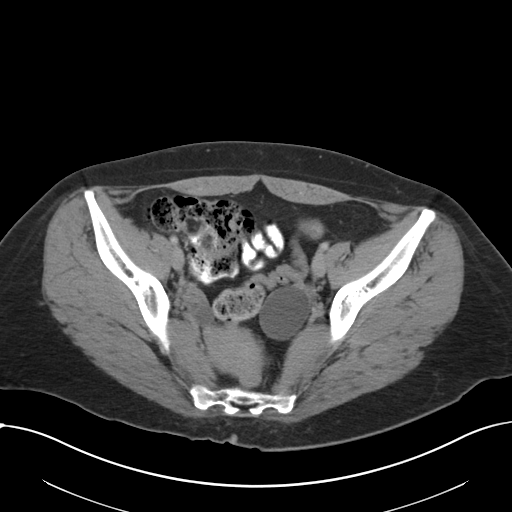
[im 35/103  soft-tissue]
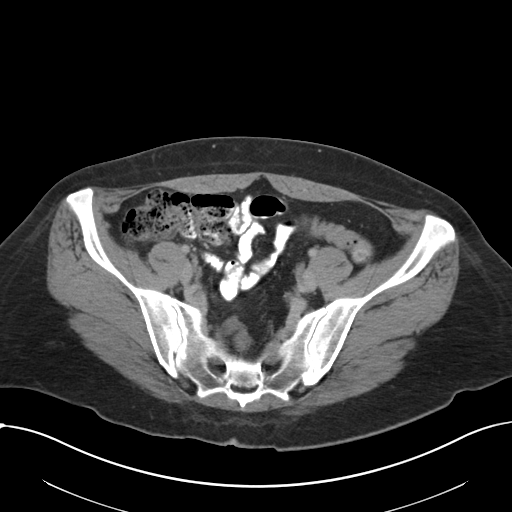
[im 43/103  soft-tissue]
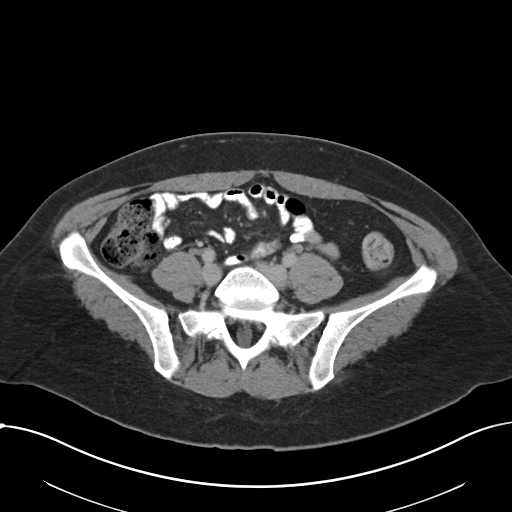
[im 52/103  soft-tissue]
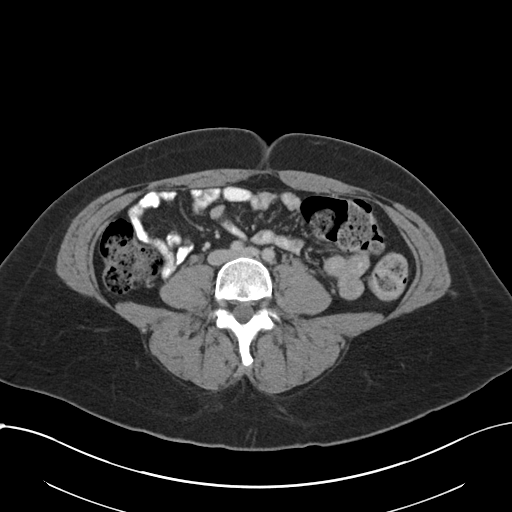
[im 60/103  soft-tissue]
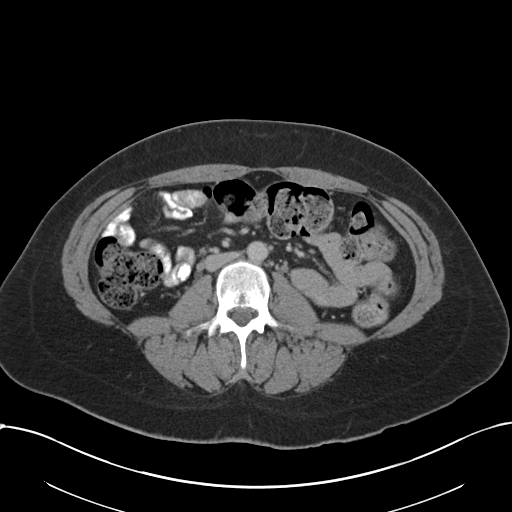
[im 69/103  soft-tissue]
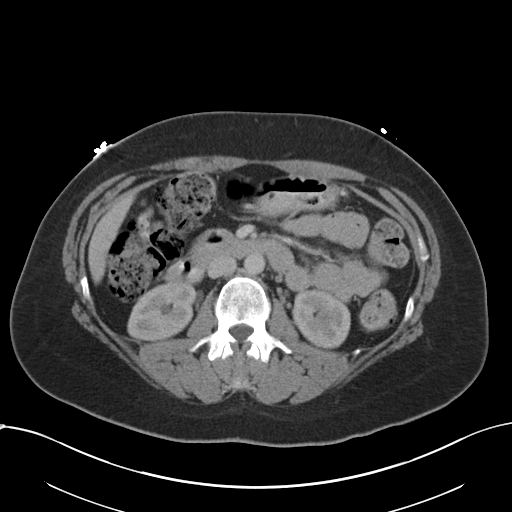
[im 69/103  bone]
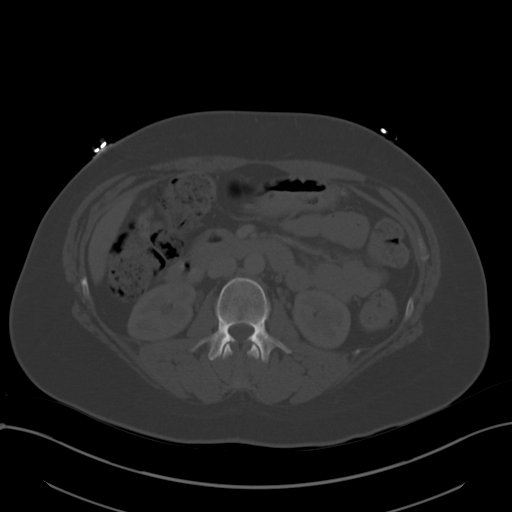
[im 73/103  soft-tissue]
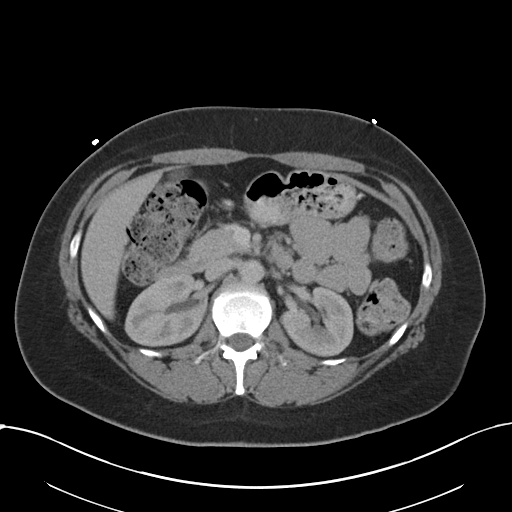
[im 81/103  soft-tissue]
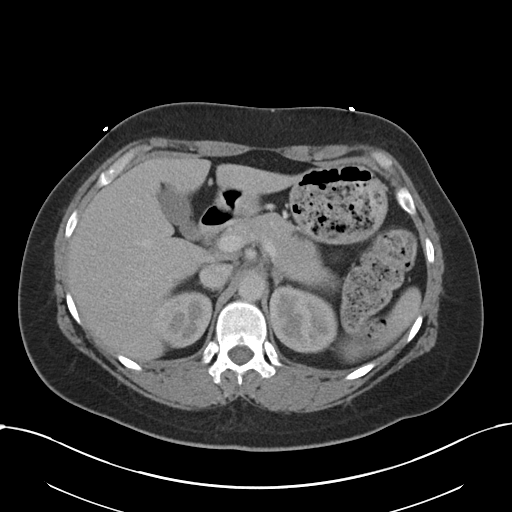
[im 90/103  soft-tissue]
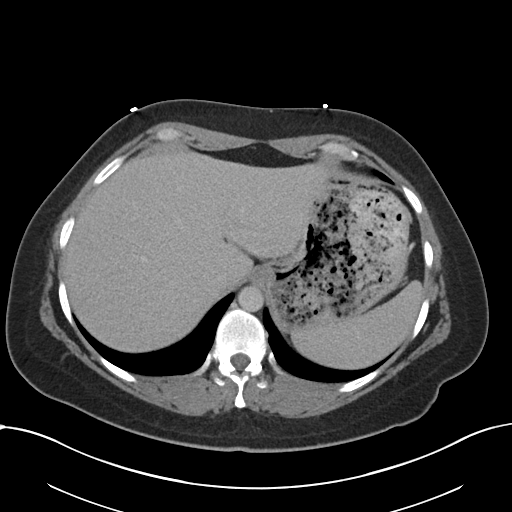
[im 98/103  soft-tissue]
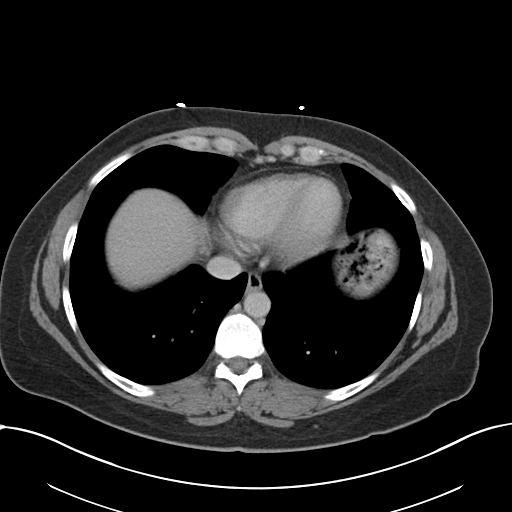

[Series 5: coronals · coronal · 0.77mm/px · 3 of 136 slices shown]
[im 46/136  soft-tissue]
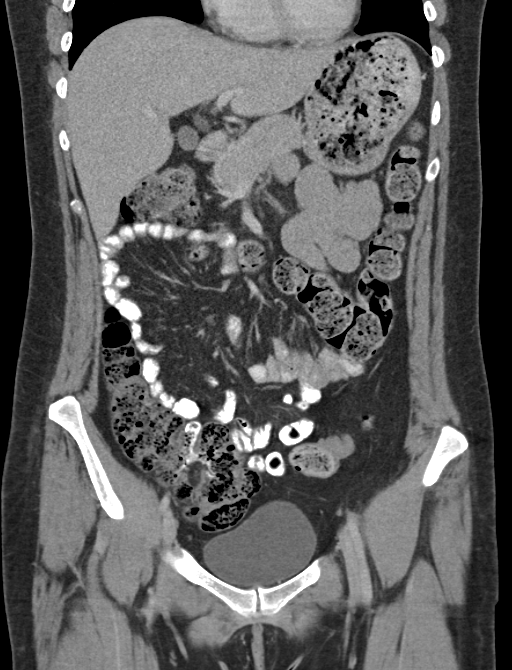
[im 61/136  soft-tissue]
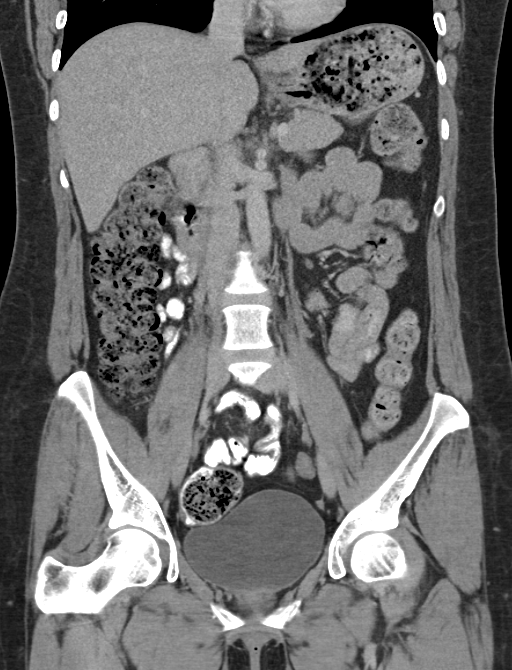
[im 76/136  soft-tissue]
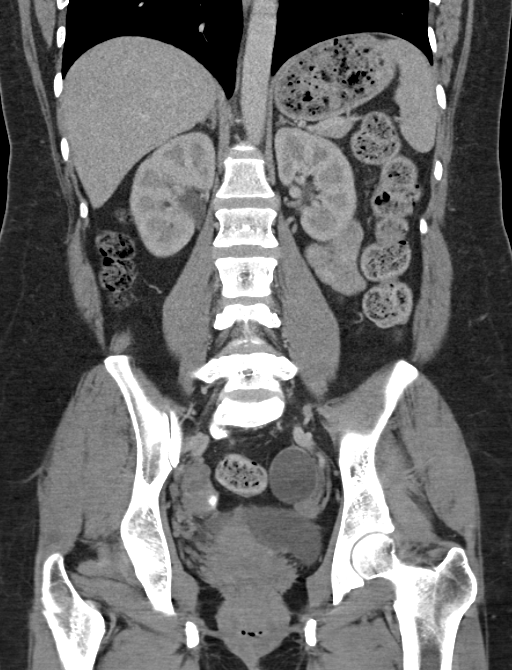

[16 of 46 positions shown; findings below may reference images not displayed]

FINDINGS: Lung bases are clear. No pleural or pericardial fluid. The liver has
normal appearance without focal lesions or biliary ductal
dilatation. No calcified gallstones. The spleen is normal. The
pancreas is normal. The adrenal glands are normal. The kidneys are
normal. The aorta and IVC are normal. No retroperitoneal mass or
lymphadenopathy. No free intraperitoneal fluid or air. There is a
fairly large amount of fecal matter in the colon. No abnormal small
bowel finding. No abnormal finding relating to the rectum or sigmoid
colon at CT. The uterus is retroverted but otherwise unremarkable.
There are small ovarian cysts on the right. On the left, there is a
simple appearing ovarian cysts with a maximal diameter of 4.6 cm. No
free fluid in the pelvis.
IMPRESSION: No cause of the gastrointestinal bleeding is identified.

Retroverted uterus. 4.6 cm left ovarian cyst, presumably functional.
This does not require follow-up in a person of this age.

## 2014-07-08 MED ORDER — IOHEXOL 300 MG/ML  SOLN
100.0000 mL | Freq: Once | INTRAMUSCULAR | Status: AC | PRN
  Administered 2014-07-08: 100 mL via INTRAVENOUS

## 2014-07-08 NOTE — ED Notes (Signed)
Pt presents via POV c/o rectal bleeding after having a BM this AM.  Pt reports bright red blood on toilet paper.  Pt reports questionable bleeding last week but unknown source d/t menstrual period.  Pt also reports vaginal d/c bloody tinged.  Pt a x 4, NAD.  PA at bedside.

## 2014-07-08 NOTE — ED Provider Notes (Signed)
CSN: [id number]     Arrival date & time 07/08/14  1115 History   First MD Initiated Contact with Patient 07/08/14 1120     Chief Complaint  Patient presents with  . Rectal Bleeding     (Consider location/radiation/quality/duration/timing/severity/associated sxs/prior Treatment) Patient is a 39 y.o. female presenting with hematochezia. The history is provided by the patient, the spouse and medical records. No language interpreter was used.  Rectal Bleeding Associated symptoms: abdominal pain (mild cramping, no currently present)   Associated symptoms: no fever, no light-headedness and no vomiting    Julia [name] is a 40 y.o. female  with a hx of anxiety, seizure, ADHD presents to the Emergency Department complaining of gradual, persistent, progressively worsening rectal bleeding onset approx 2 weeks ago and worsening this morning.  Pt reports seeing dark red blood with wiping beginning approximately 2 weeks ago but patient was also demonstrating and she reports it was unclear where the blood was coming from. She reports persistent  Vaginal discharge with bloody streaks.  Patient reports a long-standing history of hemorrhoids and anal fissures due to chronic constipation. She reports her menses has been having heavy menstrual cycles for approximately 4 months. She is under the care of an OB/GYN for this. Patient also reports increased bruising to her arms and legs over the last 8 weeks.  She reports a normal and nonpainful bowel movement this morning. She states that when she wiped she saw a very large dark blood clot.  He reports associated lightheadedness persistent since she had her seizure. She also reports short-term memory loss since this incident.  Patient reports that [place]17[place]2014 [place] she had a grand mal seizure causing her fall and strike her head against a marble floor. She reports a history of fatigue most seizure in 2006 while pregnant.  She cannot remember  any other seizure activity in her history. Patient reports a history of skull fracture as a child after falling out of a second story window.  Patient reports she was initially on Vimpat but due to side effects switched medications to Tegretol approximately 4 weeks ago. She also reports left arm and left leg paresthesias and numbness intermittently 1 month. She is currently followed by [facility] neurology. They have attributed her left arm and leg numbness to pinched nerves in her neck in the fall.   Past Medical History  Diagnosis Date  . Anxiety   . OCD (obsessive compulsive disorder)   . ADHD (attention deficit hyperactivity disorder)   . Anorexia     history of eating disorder  . Obsessive compulsive disorder   . Obsessive compulsive disorder    Past Surgical History  Procedure Laterality Date  . Liposuction trunk     Family History  Problem Relation Age of Onset  . Diabetes Mother   . Parkinsonism Mother   . Hypertension Father   . Stroke Father   . Rheum arthritis Maternal Grandfather    History  Substance Use Topics  . Smoking status: Never Smoker   . Smokeless tobacco: Not on file  . Alcohol Use: 0.6 oz/week    1 Glasses of wine per week   OB History    Gravida Para Term Preterm AB TAB SAB Ectopic Multiple Living   3 3        3      Review of Systems  Constitutional: Positive for fatigue. Negative for fever, diaphoresis, appetite change and unexpected weight change.  HENT: Negative for  mouth sores.   Eyes: Negative for visual disturbance.  Respiratory: Negative for cough, chest tightness, shortness of breath and wheezing.   Cardiovascular: Negative for chest pain.  Gastrointestinal: Positive for abdominal pain (mild cramping, no currently present), blood in stool and hematochezia. Negative for nausea, vomiting, diarrhea and constipation.  Endocrine: Negative for polydipsia, polyphagia and polyuria.  Genitourinary: Negative for dysuria, urgency, frequency and  hematuria.  Musculoskeletal: Negative for back pain and neck stiffness.  Skin: Negative for rash.  Allergic/Immunologic: Negative for immunocompromised state.  Neurological: Negative for syncope, light-headedness and headaches.  Hematological: Does not bruise/bleed easily.  Psychiatric/Behavioral: Negative for sleep disturbance. The patient is not nervous/anxious.       Allergies  Percocet  Home Medications   Prior to Admission medications   Medication Sig Start Date End Date Taking? Authorizing Provider  acetaminophen (TYLENOL) 325 MG tablet Take 650 mg by mouth every 6 (six) hours as needed.    Yes Historical Provider, MD  amphetamine-dextroamphetamine (ADDERALL XR) 25 MG 24 hr capsule Take 50 mg by mouth every morning.     Yes Historical Provider, MD  amphetamine-dextroamphetamine (ADDERALL) 30 MG tablet Take 30 mg by mouth daily as needed (ADHD).    Yes Milagros Evener, MD  aspirin-acetaminophen-caffeine (EXCEDRIN MIGRAINE) [phone] MG per tablet Take 2 tablets by mouth every 6 (six) hours as needed for headache.   Yes Historical Provider, MD  carbamazepine (EQUETRO) 200 MG CP12 12 hr capsule Take 1-2 capsules (200-400 mg total) by mouth 2 (two) times daily. 06/20/14  Yes Suanne Marker, MD  ibuprofen (ADVIL,MOTRIN) 400 MG tablet Take 400 mg by mouth every 6 (six) hours as needed. To take with food   Yes Historical Provider, MD  Levomilnacipran HCl ER (FETZIMA) 40 MG CP24 Take 40 mg by mouth every morning.   Yes Historical Provider, MD  Levomilnacipran HCl ER (FETZIMA) 80 MG CP24 Take 80 mg by mouth every morning.   Yes Historical Provider, MD  minocycline (MINOCIN,DYNACIN) 100 MG capsule Take 100 mg by mouth daily.  05/12/14  Yes Historical Provider, MD  ALPRAZolam Prudy Feeler) 1 MG tablet Take 1 mg by mouth daily as needed for anxiety. For anxiety    Historical Provider, MD   BP 125/78 mmHg  Pulse 96  Temp(Src) 98.5 F (36.9 C) (Oral)  Resp 14  SpO2 100%  LMP 06/28/2014   Breastfeeding? No Physical Exam  Constitutional: She appears well-developed and well-nourished. No distress.  Awake, alert, nontoxic appearance  HENT:  Head: Normocephalic and atraumatic.  Mouth/Throat: Oropharynx is clear and moist. No oropharyngeal exudate.  Eyes: Conjunctivae are normal. No scleral icterus.  Neck: Normal range of motion. Neck supple.  Cardiovascular: Normal rate, regular rhythm, normal heart sounds and intact distal pulses.   No murmur heard. Pulmonary/Chest: Effort normal and breath sounds normal. No respiratory distress. She has no wheezes.  Equal chest expansion  Abdominal: Soft. Bowel sounds are normal. She exhibits no distension and no mass. There is no tenderness. There is no rebound and no guarding.  Soft and nontender  Genitourinary: Rectal exam shows internal hemorrhoid and tenderness. Rectal exam shows no external hemorrhoid, no fissure, no mass and anal tone normal. Guaiac positive stool.  Mild tenderness localized to the palpable internal hemorrhoid  Musculoskeletal: Normal range of motion. She exhibits no edema.  Neurological: She is alert.  Speech is clear and goal oriented Moves extremities without ataxia  Skin: Skin is warm and dry. She is not diaphoretic.  Scattered small bruises over the  arms and legs in various stages of healing  Psychiatric: She has a normal mood and affect.  Nursing note and vitals reviewed.   ED Course  Procedures (including critical care time) Labs Review Labs Reviewed  CBC - Abnormal; Notable for the following:    Hemoglobin 11.1 (*)    HCT 33.6 (*)    All other components within normal limits  COMPREHENSIVE METABOLIC PANEL - Abnormal; Notable for the following:    Calcium 8.5 (*)    Total Protein 6.3 (*)    ALT 11 (*)    All other components within normal limits  URINALYSIS, ROUTINE W REFLEX MICROSCOPIC (NOT AT [facility]) - Abnormal; Notable for the following:    APPearance CLOUDY (*)    Bilirubin Urine SMALL (*)     All other components within normal limits  POC OCCULT BLOOD, ED - Abnormal; Notable for the following:    Fecal Occult Bld POSITIVE (*)    All other components within normal limits  PROTIME-INR  FIBRINOGEN  APTT  I-STAT BETA HCG BLOOD, ED ([facility], [facility], [facility] ONLY)  TYPE AND SCREEN  ABO/RH    Imaging Review Ct Abdomen Pelvis W Contrast  07/08/2014   CLINICAL DATA:  Rectal bleeding after bowel movement this morning.  EXAM: CT ABDOMEN AND PELVIS WITH CONTRAST  TECHNIQUE: Multidetector CT imaging of the abdomen and pelvis was performed using the standard protocol following bolus administration of intravenous contrast.  CONTRAST:  OMNIPAQUE IOHEXOL 300 MG/ML  SOLN  COMPARISON:  None.  FINDINGS: Lung bases are clear. No pleural or pericardial fluid. The liver has normal appearance without focal lesions or biliary ductal dilatation. No calcified gallstones. The spleen is normal. The pancreas is normal. The adrenal glands are normal. The kidneys are normal. The aorta and IVC are normal. No retroperitoneal mass or lymphadenopathy. No free intraperitoneal fluid or air. There is a fairly large amount of fecal matter in the colon. No abnormal small bowel finding. No abnormal finding relating to the rectum or sigmoid colon at CT. The uterus is retroverted but otherwise unremarkable. There are small ovarian cysts on the right. On the left, there is a simple appearing ovarian cysts with a maximal diameter of 4.6 cm. No free fluid in the pelvis.  IMPRESSION: No cause of the gastrointestinal bleeding is identified.  Retroverted uterus. 4.6 cm left ovarian cyst, presumably functional. This does not require follow-up in a person of this age.   Electronically Signed   By: Paulina Fusi M.D.   On: 07/08/2014 14:53     EKG Interpretation None      MDM   Final diagnoses:  Rectal bleeding  Abdominal pain  Abdominal pain  Abdominal pain   Julia [name] presents with a complicated recent medical hx c/o rectal  bleeding for the last 2 weeks, worsening today.  Pt with no gross blood on DRE and small palpable internal hemorrhoid.  Will get labs, coagulation panel and reassess. Patient expresses significant concern for colon cancer as this runs in her family. I discussed the risk and benefit of an abdominal CT and the likelihood that it will not show the source of her bleeding. She wishes to proceed with this.  3:00PM Patient remains with soft and nontender abdomen. No further bouts of rectal bleeding here in the emergency department. She is with only mild anemia on lab work and this appears to be baseline for her. Other labs are reassuring. Patient had no vaginal bleeding on exam. No hematuria. No  bleeding from her gums, no epistaxis. Patient is without petechiae on exam. PT/INR is normal.  Patient's vitals remained stable throughout her time in the emergency department. She has had no tachycardia or hypotension.  I discussed all lab findings with the patient. She is to follow-up with her primary care physician and gastroenterology next week.  I have personally reviewed patient's vitals, nursing note and any pertinent labs or imaging.  I performed an undressed physical exam.    It has been determined that no acute conditions requiring further emergency intervention are present at this time. The patient/guardian have been advised of the diagnosis and plan. I reviewed all labs and imaging including any potential incidental findings. We have discussed signs and symptoms that warrant return to the ED and they are listed in the discharge instructions.    Vital signs are stable at discharge.   BP 125/78 mmHg  Pulse 96  Temp(Src) 98.5 F (36.9 C) (Oral)  Resp 14  SpO2 100%  LMP 06/28/2014  Breastfeeding? No            Dierdre Forth, PA-C 07/08/14 1710  Gwyneth Sprout, MD 07/12/14 [phone]

## 2014-07-08 NOTE — ED Notes (Signed)
Patient transported to CT 

## 2014-07-08 NOTE — Discharge Instructions (Signed)
1. Medications: usual home medications 2. Treatment: rest, drink plenty of fluids,  3. Follow Up: Please followup with your primary doctor in 2-3 days for discussion of your diagnoses and further evaluation after today's visit; Please also call and make an appointment with gastroenterology.    Rectal Bleeding Rectal bleeding is when blood passes out of the anus. It is usually a sign that something is wrong. It may not be serious, but it should always be evaluated. Rectal bleeding may present as bright red blood or extremely dark stools. The color may range from dark red or maroon to black (like tar). It is important that the cause of rectal bleeding be identified so treatment can be started and the problem corrected. CAUSES   Hemorrhoids. These are enlarged (dilated) blood vessels or veins in the anal or rectal area.  Fistulas. Theseare abnormal, burrowing channels that usually run from inside the rectum to the skin around the anus. They can bleed.  Anal fissures. This is a tear in the tissue of the anus. Bleeding occurs with bowel movements.  Diverticulosis. This is a condition in which pockets or sacs project from the bowel wall. Occasionally, the sacs can bleed.  Diverticulitis. Thisis an infection involving diverticulosis of the colon.  Proctitis and colitis. These are conditions in which the rectum, colon, or both, can become inflamed and pitted (ulcerated).  Polyps and cancer. Polyps are non-cancerous (benign) growths in the colon that may bleed. Certain types of polyps turn into cancer.  Protrusion of the rectum. Part of the rectum can project from the anus and bleed.  Certain medicines.  Intestinal infections.  Blood vessel abnormalities. HOME CARE INSTRUCTIONS  Eat a high-fiber diet to keep your stool soft.  Limit activity.  Drink enough fluids to keep your urine clear or pale yellow.  Warm baths may be useful to soothe rectal pain.  Follow up with your caregiver  as directed. SEEK IMMEDIATE MEDICAL CARE IF:  You develop increased bleeding.  You have black or dark red stools.  You vomit blood or material that looks like coffee grounds.  You have abdominal pain or tenderness.  You have a fever.  You feel weak, nauseous, or you faint.  You have severe rectal pain or you are unable to have a bowel movement. MAKE SURE YOU:  Understand these instructions.  Will watch your condition.  Will get help right away if you are not doing well or get worse. Document Released: 07/04/2001 Document Revised: 04/06/2011 Document Reviewed: 06/29/2010 [facility] Patient Information 2015 [place], [place]. This information is not intended to replace advice given to you by your health care provider. Make sure you discuss any questions you have with your health care provider.

## 2014-08-03 ENCOUNTER — Ambulatory Visit (INDEPENDENT_AMBULATORY_CARE_PROVIDER_SITE_OTHER): Payer: BLUE CROSS/BLUE SHIELD | Admitting: Diagnostic Neuroimaging

## 2014-08-03 ENCOUNTER — Encounter: Payer: Self-pay | Admitting: Diagnostic Neuroimaging

## 2014-08-03 VITALS — BP 122/81 | HR 97 | Ht 73.5 in | Wt 214.0 lb

## 2014-08-03 DIAGNOSIS — G40209 Localization-related (focal) (partial) symptomatic epilepsy and epileptic syndromes with complex partial seizures, not intractable, without status epilepticus: Secondary | ICD-10-CM

## 2014-08-03 MED ORDER — CARBAMAZEPINE ER 200 MG PO TB12: 200.0000 mg | ORAL_TABLET | Freq: Two times a day (BID) | ORAL | Status: DC

## 2014-08-03 NOTE — Progress Notes (Signed)
[facility]  PATIENT: Julia [name] DOB: 04-16-1975  REFERRING CLINICIAN: Cyndie Mull HISTORY FROM: patient REASON FOR VISIT: follow up    HISTORICAL  CHIEF COMPLAINT:  Chief Complaint  Patient presents with  . Seizures    rm 7  . Follow-up    HISTORY OF PRESENT ILLNESS:   UPDATE 08/03/14: Since last visit, no sz. Memory loss and attention still affected. Tolerating CBZ  BID. Separately, had rectal bleeding and went to ER last month, hemodynamically stable, and d/c home with dx of hemorrhoids vs polyp. Has outpatient GI follow up pending.  UPDATE 06/20/14: Since last visit, started vimpat  BID, then within a few days had increasing headaches and fatigue, malaise, pain in eyes, nausea. Tried it for 4 weeks, then stopped on 06/17/14. Now feeling better. No seizures. Some intermittent muscle jerks since I last saw her.   PRIOR HPI (05/23/14): 39 year old right-handed female with history of ADHD, OCD, anxiety, head trauma as a child in 1983, migraine, anxiety, here for evaluation of seizure. Patient was on vacation in [place], 05/17/14, at the resort check encounter, when all of a sudden she had a sense of dj vu. Her husband witnessed her developed a blank look, stiffness of all extremities, convulsions, pale and then dark appearance, falling to the ground striking her head onto the marble floor. She had tongue biting, generalized convulsions for 5 minutes. She was confused, and gradually recovered over 30 minutes. She was about a by the resort physician who recommended emergency room evaluation. Patient rested for the rest of the day and then went to the emergency room the next day. Patient had CT of the head and neck which were unremarkable. She had significant dizziness, weakness, malaise over the next few days, finally returning to the [place] a few days later. She continues to have left arm numbness/weakness and left leg weakness. Patient had an episode of  possible complex partial or petit mall seizure in 2006. At that time she was in her second trimester pregnancy. She was at dinner with her husband and family, when all of a sudden she had a blank stare, slumped over, memory lapse and confusion. Patient has had intermittent episodes of olfactory hallucinations for past 6 years, where she senses a smell of something rotten or bad smelling. No episodes of temporal or spatial distortion. No other convulsive episodes. Patient had episode of head trauma at age 59 years old when she fell out of a second story sliding glass door, falling onto concrete and gravel. She was admitted to the hospital in intensive care unit, critically ill, and apparently was not expected to survive. Fortunately she recovered fully after a few weeks.   REVIEW OF SYSTEMS: Full 14 system review of systems performed and notable only for as per HPI.   ALLERGIES: Allergies  Allergen Reactions  . Percocet [Oxycodone-Acetaminophen] Nausea Only    HOME MEDICATIONS: Outpatient Prescriptions Prior to Visit  Medication Sig Dispense Refill  . acetaminophen (TYLENOL) 325 MG tablet Take 650 mg by mouth every 6 (six) hours as needed.     . ALPRAZolam (XANAX) 1 MG tablet Take 1 mg by mouth daily as needed for anxiety. For anxiety    . amphetamine-dextroamphetamine (ADDERALL XR) 25 MG 24 hr capsule Take 50 mg by mouth every morning.      Marland Kitchen amphetamine-dextroamphetamine (ADDERALL) 30 MG tablet Take 30 mg by mouth daily as needed (ADHD).     Marland Kitchen aspirin-acetaminophen-caffeine (EXCEDRIN MIGRAINE) 250-250-65 MG per tablet Take  2 tablets by mouth every 6 (six) hours as needed for headache.    . ibuprofen (ADVIL,MOTRIN) 400 MG tablet Take 400 mg by mouth every 6 (six) hours as needed. To take with food    . Levomilnacipran HCl ER (FETZIMA) 40 MG CP24 Take 40 mg by mouth every morning.    . Levomilnacipran HCl ER (FETZIMA) 80 MG CP24 Take 80 mg by mouth every morning.    . minocycline  (MINOCIN,DYNACIN) 100 MG capsule Take 100 mg by mouth daily.     . carbamazepine (EQUETRO) 200 MG CP12 12 hr capsule Take 1-2 capsules (200-400 mg total) by mouth 2 (two) times daily. 60 each 6   No facility-administered medications prior to visit.    PAST MEDICAL HISTORY: Past Medical History  Diagnosis Date  . Anxiety   . OCD (obsessive compulsive disorder)   . ADHD (attention deficit hyperactivity disorder)   . Anorexia     history of eating disorder  . Obsessive compulsive disorder   . Seizures 05/17/14    PAST SURGICAL HISTORY: Past Surgical History  Procedure Laterality Date  . Liposuction trunk      FAMILY HISTORY: Family History  Problem Relation Age of Onset  . Parkinsonism Mother   . Hypertension Father   . Stroke Father   . Lymphoma Father   . Rheum arthritis Maternal Grandfather     SOCIAL HISTORY:  History   Social History  . Marital Status: Married    Spouse Name: N/A  . Number of Children: N/A  . Years of Education: N/A   Occupational History  . Not on file.   Social History Main Topics  . Smoking status: Never Smoker   . Smokeless tobacco: Not on file  . Alcohol Use: 0.6 oz/week    1 Glasses of wine per week  . Drug Use: No  . Sexual Activity: Yes    Birth Control/ Protection: None   Other Topics Concern  . Not on file   Social History Narrative   Married. Has 3 children. A full time homemaker. Graduated from college with a degree in social work.   Has a private health insurance through her spouse.     PHYSICAL EXAM  Filed Vitals:   08/03/14 0929  BP: 122/81  Pulse: 97  Height: 6' 1.5" (1.867 m)  Weight: 214 lb (97.07 kg)   Wt Readings from Last 3 Encounters:  08/03/14 214 lb (97.07 kg)  06/20/14 210 lb 3.2 oz (95.346 kg)  05/23/14 204 lb 1.6 oz (92.579 kg)   Body mass index is 27.85 kg/(m^2).  No exam data present  No flowsheet data found.   GENERAL EXAM: Patient is in no distress; well developed, nourished and  groomed; neck is supple  CARDIOVASCULAR: Regular rate and rhythm, no murmurs, no carotid bruits  NEUROLOGIC: MENTAL STATUS: awake, alert, language fluent, comprehension intact, naming intact, fund of knowledge appropriate CRANIAL NERVE: pupils equal and reactive to light, visual fields full to confrontation, extraocular muscles intact, no nystagmus, facial sensation and strength symmetric, hearing intact, palate elevates symmetrically, uvula midline, shoulder shrug symmetric, tongue midline. MOTOR: normal bulk and tone, full strength in the BUE, BLE SENSORY: normal and symmetric to light touch COORDINATION: finger-nose-finger, fine finger movements normal REFLEXES: deep tendon reflexes present and symmetric GAIT/STATION: narrow based gait; romberg is negative    DIAGNOSTIC DATA (LABS, IMAGING, TESTING) - I reviewed patient records, labs, notes, testing and imaging myself where available.  Lab Results  Component Value Date  WBC 5.2 07/08/2014   HGB 11.1* 07/08/2014   HCT 33.6* 07/08/2014   MCV 83.6 07/08/2014   PLT 264 07/08/2014      Component Value Date/Time   NA 137 07/08/2014 1211   K 4.1 07/08/2014 1211   CL 103 07/08/2014 1211   CO2 28 07/08/2014 1211   GLUCOSE 92 07/08/2014 1211   BUN 11 07/08/2014 1211   CREATININE 0.83 07/08/2014 1211   CALCIUM 8.5* 07/08/2014 1211   PROT 6.3* 07/08/2014 1211   ALBUMIN 3.6 07/08/2014 1211   AST 18 07/08/2014 1211   ALT 11* 07/08/2014 1211   ALKPHOS 78 07/08/2014 1211   BILITOT 0.4 07/08/2014 1211   GFRNONAA >60 07/08/2014 1211   GFRAA >60 07/08/2014 1211   No results found for: CHOL, HDL, LDLCALC, LDLDIRECT, TRIG, CHOLHDL No results found for: ZOXW9U No results found for: VITAMINB12 Lab Results  Component Value Date   TSH 0.711 05/18/2010    05/18/14 CT HEAD AND CERVICAL SPINE (D.R. Images and reports reviewed) - no acute findings  05/23/14 MRI cervical spine - shows mild cervical disc bulges with neuroforaminal  encroachment at the C3 and C4 levels bilaterally.   05/23/14 MRI brain - no significant abnormalities  05/30/14 EEG - secondary to diffuse generalized background slowing, but there are episodes of paroxysmal delta slowing that at times appears to be more prominent in the left frontotemporal area. This study suggests a lowered seizure threshold, with some predominance of abnormalities in the left hemisphere. Clinical correlation is required.    ASSESSMENT AND PLAN  39 y.o. year old female here with new onset generalized convulsive seizure on 05/17/14. Possible complex partial or petit mal seizure in 2006. Intermittent episodes of olfactory hallucinations and remote history of head trauma in 1983. 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 EEG shows focal and generalized slowing, some of which could relate back to her head trauma and skull fracture from age 59 years old.   Overall doing well from sz control standpoint. Some memory issues lingering, may be effect of sz meds vs ADHD vs mood d/o. Hopefully should improve over time.   PLAN: - continue CBZ ER 200mg  BID --> titrate up to 400mg  BID as tolerated - seizure precautions and education reviewed - no driving until seizure free x 6 months as per [place] law  Meds ordered this encounter  Medications  . DISCONTD: carbamazepine (TEGRETOL XR) 200 MG 12 hr tablet    Sig: TAKE 1-2 TABLETS BY MOUTH TWICE A DAY    Refill:  6  . carbamazepine (TEGRETOL XR) 200 MG 12 hr tablet    Sig: Take 1-2 tablets (200-400 mg total) by mouth 2 (two) times daily.    Dispense:  360 tablet    Refill:  4   Return in about 4 months (around 12/04/2014).   Suanne Marker, MD 08/03/2014, 10:12 AM Certified in Neurology, Neurophysiology and Neuroimaging  [facility] [address] [place], [place] [address] [phone]

## 2014-09-11 ENCOUNTER — Other Ambulatory Visit: Payer: Self-pay | Admitting: Diagnostic Neuroimaging

## 2014-09-11 DIAGNOSIS — R29898 Other symptoms and signs involving the musculoskeletal system: Secondary | ICD-10-CM

## 2014-09-11 DIAGNOSIS — R569 Unspecified convulsions: Secondary | ICD-10-CM

## 2014-12-04 ENCOUNTER — Ambulatory Visit: Payer: Self-pay | Admitting: Diagnostic Neuroimaging

## 2015-05-27 DIAGNOSIS — L700 Acne vulgaris: Secondary | ICD-10-CM | POA: Diagnosis not present

## 2015-05-27 DIAGNOSIS — Z79899 Other long term (current) drug therapy: Secondary | ICD-10-CM | POA: Diagnosis not present

## 2015-08-05 DIAGNOSIS — Z79899 Other long term (current) drug therapy: Secondary | ICD-10-CM | POA: Diagnosis not present

## 2015-08-05 DIAGNOSIS — L700 Acne vulgaris: Secondary | ICD-10-CM | POA: Diagnosis not present

## 2015-08-05 DIAGNOSIS — L0109 Other impetigo: Secondary | ICD-10-CM | POA: Diagnosis not present

## 2015-08-07 DIAGNOSIS — N390 Urinary tract infection, site not specified: Secondary | ICD-10-CM | POA: Diagnosis not present

## 2015-08-07 DIAGNOSIS — R300 Dysuria: Secondary | ICD-10-CM | POA: Diagnosis not present

## 2015-08-07 DIAGNOSIS — R8299 Other abnormal findings in urine: Secondary | ICD-10-CM | POA: Diagnosis not present

## 2015-09-05 DIAGNOSIS — L700 Acne vulgaris: Secondary | ICD-10-CM | POA: Diagnosis not present

## 2015-09-05 DIAGNOSIS — Z79899 Other long term (current) drug therapy: Secondary | ICD-10-CM | POA: Diagnosis not present

## 2015-10-04 DIAGNOSIS — J028 Acute pharyngitis due to other specified organisms: Secondary | ICD-10-CM | POA: Diagnosis not present

## 2015-10-17 DIAGNOSIS — Z79899 Other long term (current) drug therapy: Secondary | ICD-10-CM | POA: Diagnosis not present

## 2015-11-15 DIAGNOSIS — R790 Abnormal level of blood mineral: Secondary | ICD-10-CM | POA: Diagnosis not present

## 2015-11-15 DIAGNOSIS — E559 Vitamin D deficiency, unspecified: Secondary | ICD-10-CM | POA: Diagnosis not present

## 2015-11-15 DIAGNOSIS — F329 Major depressive disorder, single episode, unspecified: Secondary | ICD-10-CM | POA: Diagnosis not present

## 2015-11-15 DIAGNOSIS — E7212 Methylenetetrahydrofolate reductase deficiency: Secondary | ICD-10-CM | POA: Diagnosis not present

## 2015-11-15 DIAGNOSIS — N926 Irregular menstruation, unspecified: Secondary | ICD-10-CM | POA: Diagnosis not present

## 2015-12-03 DIAGNOSIS — F3342 Major depressive disorder, recurrent, in full remission: Secondary | ICD-10-CM | POA: Diagnosis not present

## 2015-12-03 DIAGNOSIS — F410 Panic disorder [episodic paroxysmal anxiety] without agoraphobia: Secondary | ICD-10-CM | POA: Diagnosis not present

## 2015-12-11 DIAGNOSIS — F329 Major depressive disorder, single episode, unspecified: Secondary | ICD-10-CM | POA: Diagnosis not present

## 2015-12-11 DIAGNOSIS — E7212 Methylenetetrahydrofolate reductase deficiency: Secondary | ICD-10-CM | POA: Diagnosis not present

## 2015-12-11 DIAGNOSIS — F909 Attention-deficit hyperactivity disorder, unspecified type: Secondary | ICD-10-CM | POA: Diagnosis not present

## 2015-12-11 DIAGNOSIS — N926 Irregular menstruation, unspecified: Secondary | ICD-10-CM | POA: Diagnosis not present

## 2015-12-20 ENCOUNTER — Emergency Department (HOSPITAL_COMMUNITY): Payer: BLUE CROSS/BLUE SHIELD

## 2015-12-20 ENCOUNTER — Emergency Department (HOSPITAL_COMMUNITY)
Admission: EM | Admit: 2015-12-20 | Discharge: 2015-12-20 | Disposition: A | Discharge status: Home / Self Care | Payer: BLUE CROSS/BLUE SHIELD | Attending: Emergency Medicine | Admitting: Emergency Medicine

## 2015-12-20 ENCOUNTER — Encounter (HOSPITAL_COMMUNITY): Payer: Self-pay

## 2015-12-20 DIAGNOSIS — F909 Attention-deficit hyperactivity disorder, unspecified type: Secondary | ICD-10-CM | POA: Diagnosis not present

## 2015-12-20 DIAGNOSIS — J189 Pneumonia, unspecified organism: Secondary | ICD-10-CM

## 2015-12-20 DIAGNOSIS — R51 Headache: Secondary | ICD-10-CM | POA: Diagnosis not present

## 2015-12-20 DIAGNOSIS — J181 Lobar pneumonia, unspecified organism: Secondary | ICD-10-CM | POA: Insufficient documentation

## 2015-12-20 DIAGNOSIS — R509 Fever, unspecified: Secondary | ICD-10-CM | POA: Diagnosis not present

## 2015-12-20 DIAGNOSIS — M549 Dorsalgia, unspecified: Secondary | ICD-10-CM | POA: Diagnosis not present

## 2015-12-20 DIAGNOSIS — B349 Viral infection, unspecified: Secondary | ICD-10-CM | POA: Diagnosis not present

## 2015-12-20 LAB — COMPREHENSIVE METABOLIC PANEL
ALT: 72 U/L — ABNORMAL HIGH (ref 14–54)
AST: 100 U/L — ABNORMAL HIGH (ref 15–41)
Albumin: 3.2 g/dL — ABNORMAL LOW (ref 3.5–5.0)
Alkaline Phosphatase: 149 U/L — ABNORMAL HIGH (ref 38–126)
Anion gap: 8 (ref 5–15)
BUN: <5 mg/dL — ABNORMAL LOW (ref 6–20)
CO2: 18 mmol/L — ABNORMAL LOW (ref 22–32)
Calcium: 8.4 mg/dL — ABNORMAL LOW (ref 8.9–10.3)
Chloride: 110 mmol/L (ref 101–111)
Creatinine, Ser: 0.76 mg/dL (ref 0.44–1.00)
GFR calc Af Amer: >60 mL/min (ref >60)
GFR calc non Af Amer: >60 mL/min (ref >60)
Glucose, Bld: 119 mg/dL — ABNORMAL HIGH (ref 65–99)
Potassium: 3.1 mmol/L — ABNORMAL LOW (ref 3.5–5.1)
Sodium: 136 mmol/L (ref 135–145)
Total Bilirubin: 0.1 mg/dL — ABNORMAL LOW (ref 0.3–1.2)
Total Protein: 6.7 g/dL (ref 6.5–8.1)

## 2015-12-20 LAB — I-STAT BETA HCG BLOOD, ED (MC, WL, AP ONLY): I-stat hCG, quantitative: <5 m[IU]/mL (ref <5)

## 2015-12-20 LAB — CSF CELL COUNT WITH DIFFERENTIAL
RBC Count, CSF: 1 /mm3 — ABNORMAL HIGH
RBC Count, CSF: 35 /mm3 — ABNORMAL HIGH
Tube #: 1
Tube #: 4
WBC, CSF: 1 /mm3 (ref 0–5)
WBC, CSF: 2 /mm3 (ref 0–5)

## 2015-12-20 LAB — CBC WITH DIFFERENTIAL/PLATELET
Basophils Absolute: 0 10*3/uL (ref 0.0–0.1)
Basophils Relative: 0 %
Eosinophils Absolute: 0 10*3/uL (ref 0.0–0.7)
Eosinophils Relative: 0 %
HCT: 33.4 % — ABNORMAL LOW (ref 36.0–46.0)
Hemoglobin: 10.8 g/dL — ABNORMAL LOW (ref 12.0–15.0)
Lymphocytes Relative: 8 %
Lymphs Abs: 0.8 10*3/uL (ref 0.7–4.0)
MCH: 25.6 pg — ABNORMAL LOW (ref 26.0–34.0)
MCHC: 32.3 g/dL (ref 30.0–36.0)
MCV: 79.1 fL (ref 78.0–100.0)
Monocytes Absolute: 0.8 10*3/uL (ref 0.1–1.0)
Monocytes Relative: 8 %
Neutro Abs: 8.7 10*3/uL — ABNORMAL HIGH (ref 1.7–7.7)
Neutrophils Relative %: 84 %
Platelets: 210 10*3/uL (ref 150–400)
RBC: 4.22 MIL/uL (ref 3.87–5.11)
RDW: 15.5 % (ref 11.5–15.5)
WBC: 10.3 10*3/uL (ref 4.0–10.5)

## 2015-12-20 LAB — PROTEIN, CSF: Total  Protein, CSF: 27 mg/dL (ref 15–45)

## 2015-12-20 LAB — I-STAT CG4 LACTIC ACID, ED: Lactic Acid, Venous: 0.86 mmol/L (ref 0.5–1.9)

## 2015-12-20 LAB — GLUCOSE, CSF: Glucose, CSF: 72 mg/dL — ABNORMAL HIGH (ref 40–70)

## 2015-12-20 IMAGING — CR DG CHEST 2V
2 series · 2 of 2 positions shown · non-contrast
Comparison: PA and lateral chest [DATE].

CLINICAL DATA: Headache, neck pain and fever since [DATE].

EXAM:
CHEST  2 VIEW

[chest pa]
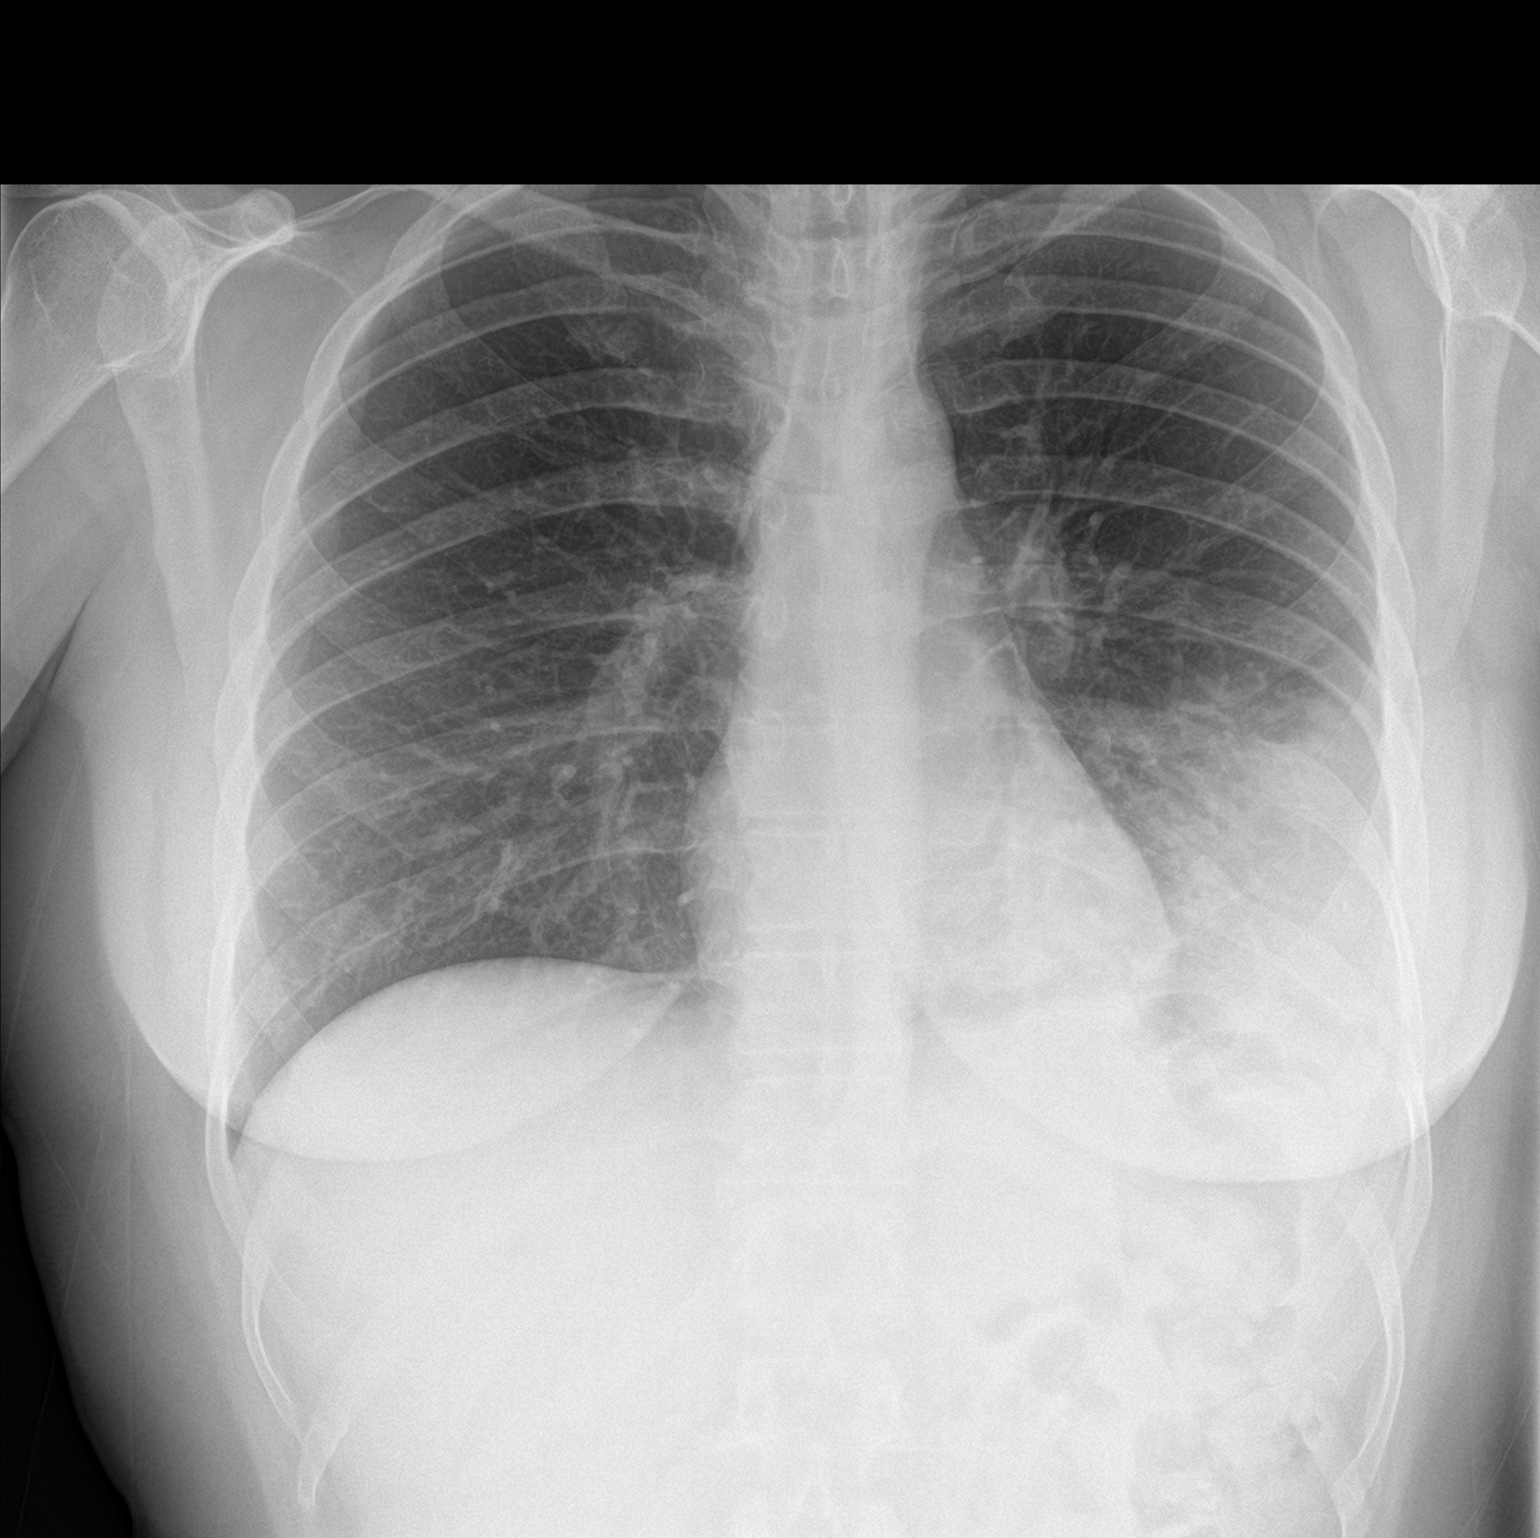

[chest lat]
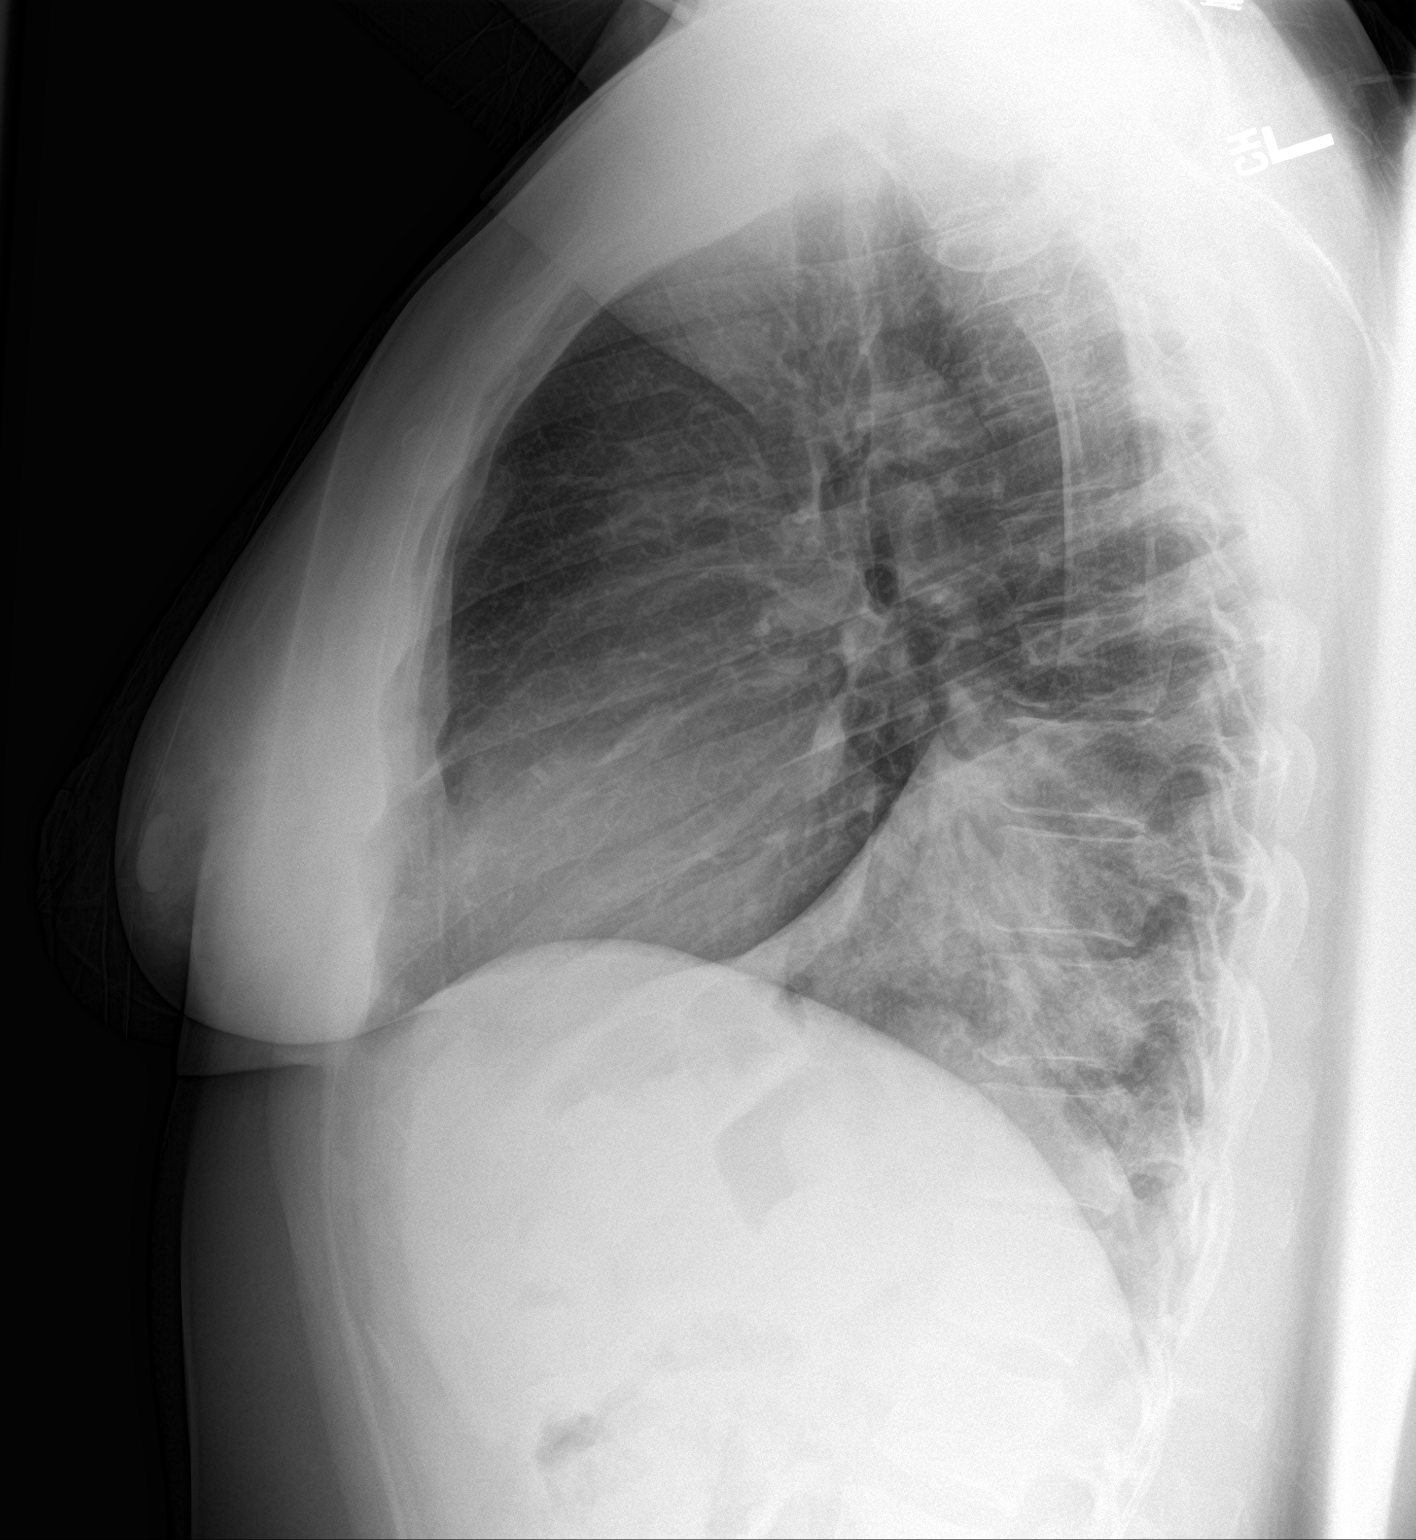

[2 of 2 positions shown; findings below may reference images not displayed]

FINDINGS: There is dense left lower lobe airspace disease consistent with
pneumonia. The right lung is clear. No pneumothorax or pleural
fluid. Heart size is normal.
IMPRESSION: Left lower lobe pneumonia.  Recommend followup to clearing.

## 2015-12-20 IMAGING — CT CT HEAD W/O CM
3 of 4 series · 18 of 47 positions shown, 21 images · non-contrast
Comparison: None.

CLINICAL DATA: Headache with dizziness for 3 days

EXAM:
CT HEAD WITHOUT CONTRAST
TECHNIQUE: Contiguous axial images were obtained from the base of the skull
through the vertex without intravenous contrast.

[Series 201: head w/o, idose (1) · axial · non-contrast · 0.43mm/px · z∈[+92,+217]mm · 12 of 31 slices shown, 15 images]
[im 3/31  brain]
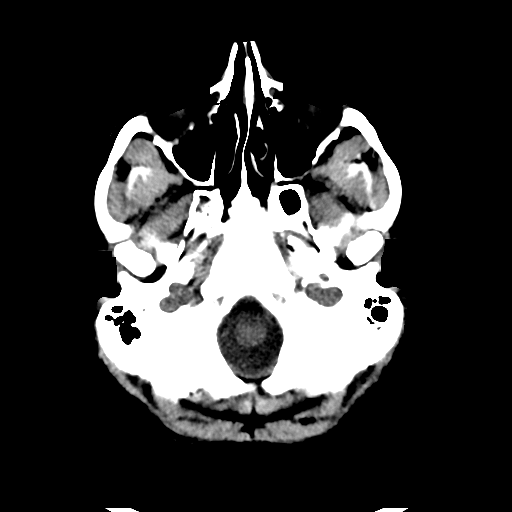
[im 3/31  bone]
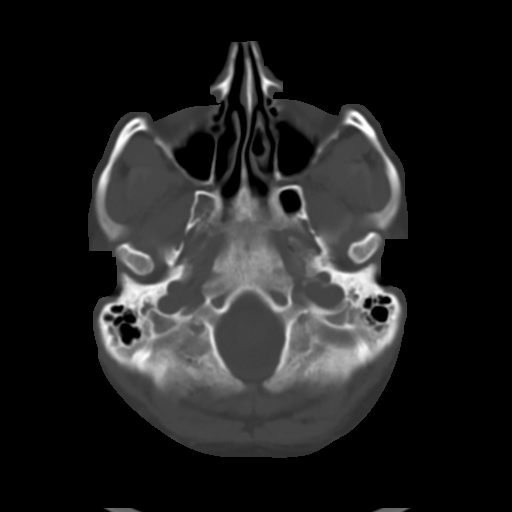
[im 5/31  brain]
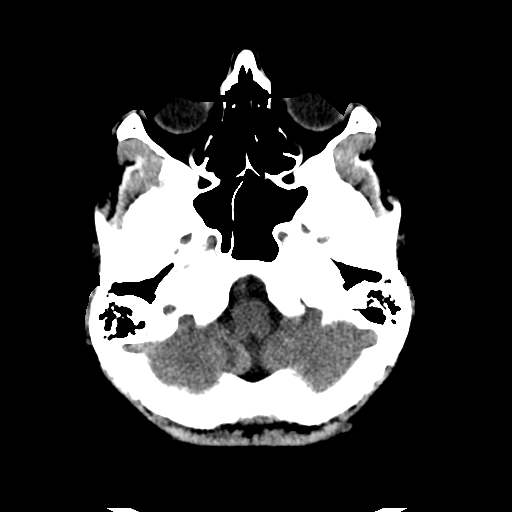
[im 7/31  brain]
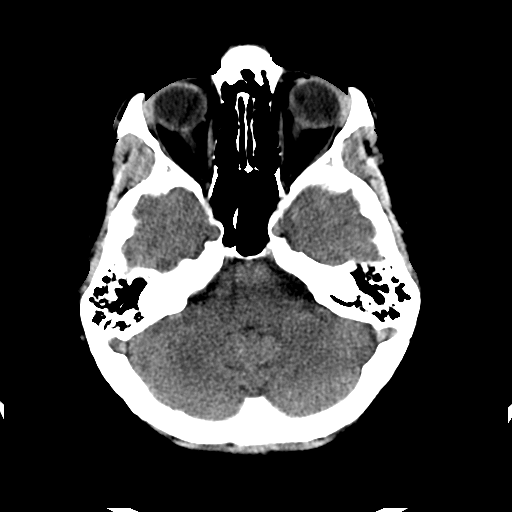
[im 9/31  brain]
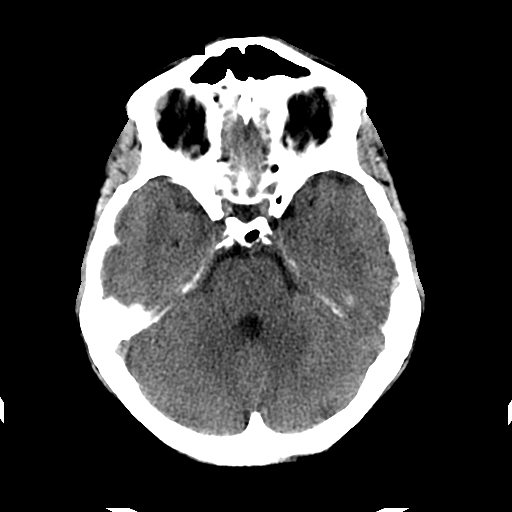
[im 11/31  brain]
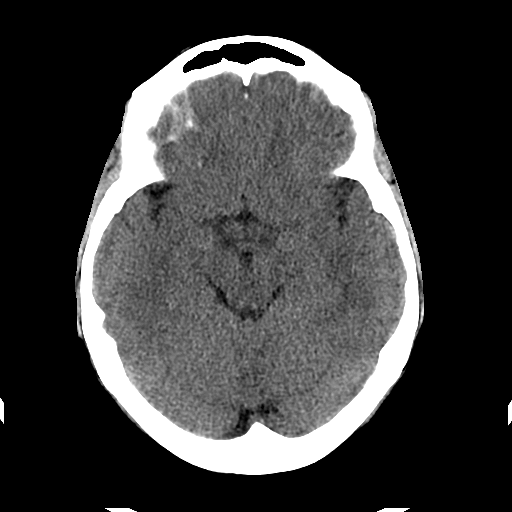
[im 11/31  bone]
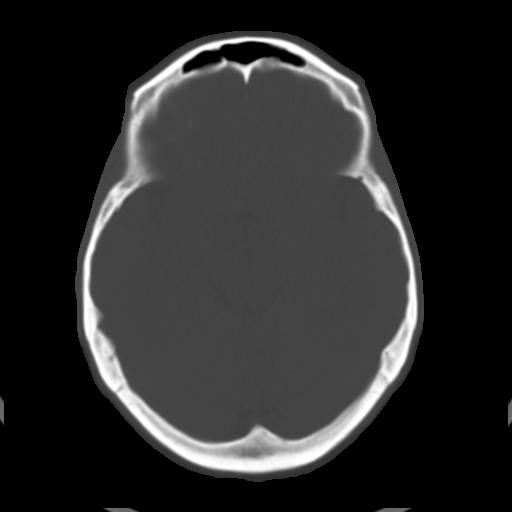
[im 13/31  brain]
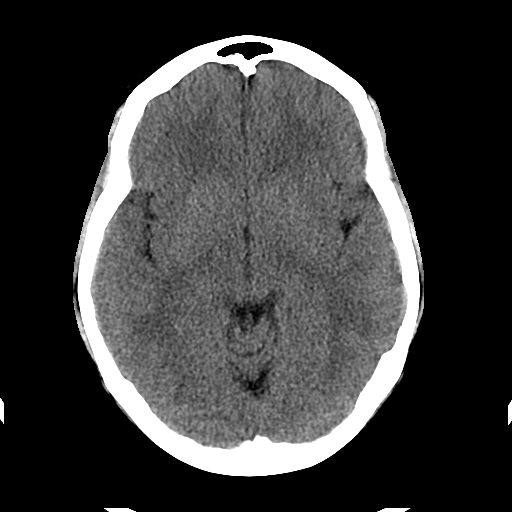
[im 18/31  brain]
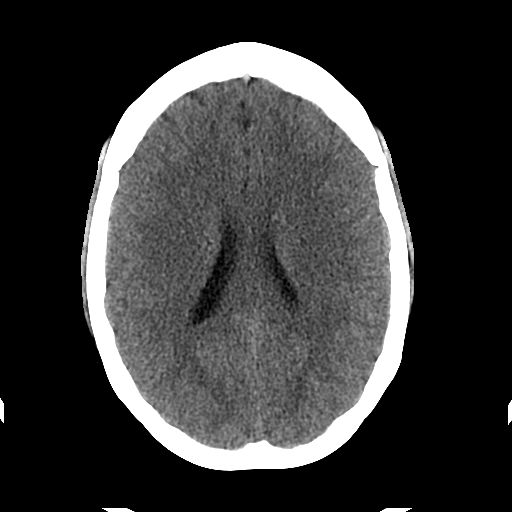
[im 20/31  brain]
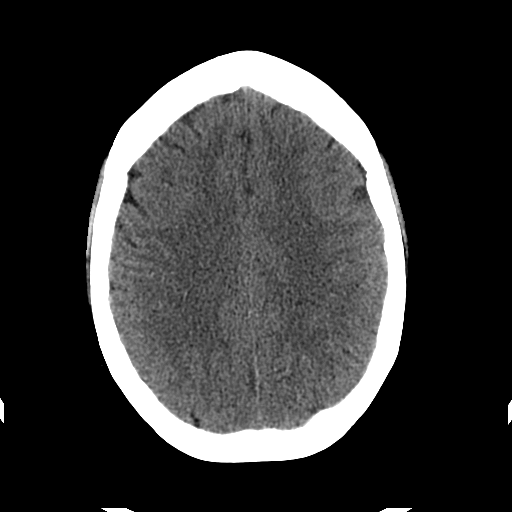
[im 22/31  brain]
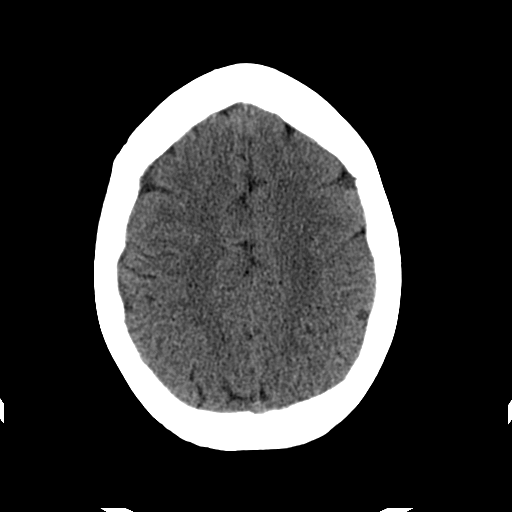
[im 22/31  bone]
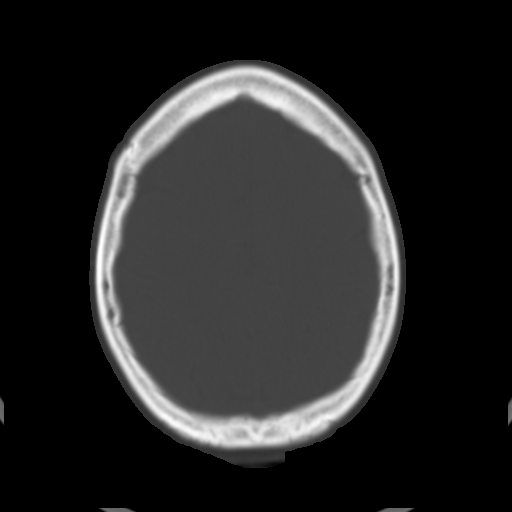
[im 24/31  brain]
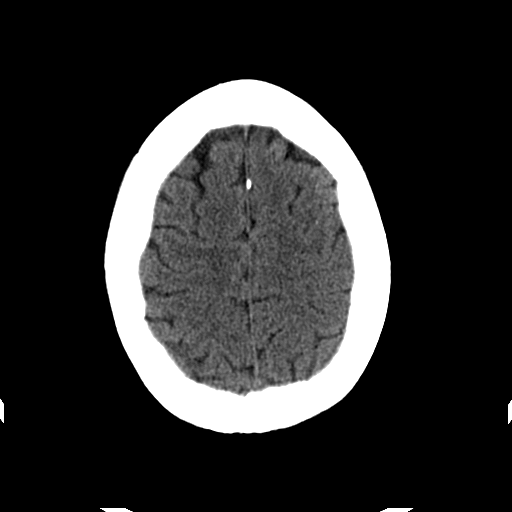
[im 26/31  brain]
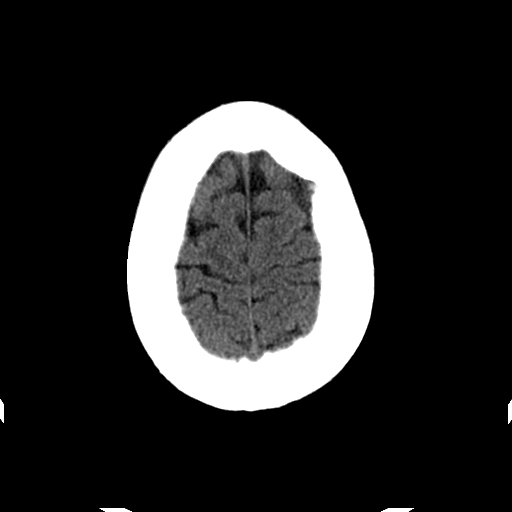
[im 28/31  brain]
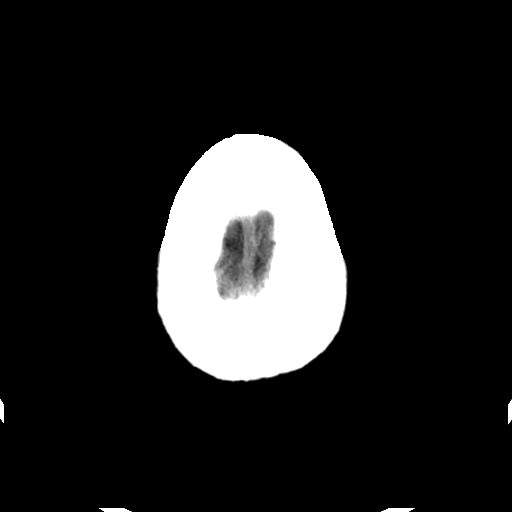

[Series 203: coronal st, idose (1) · coronal · 0.40mm/px · 3 of 68 slices shown]
[im 23/68  brain]
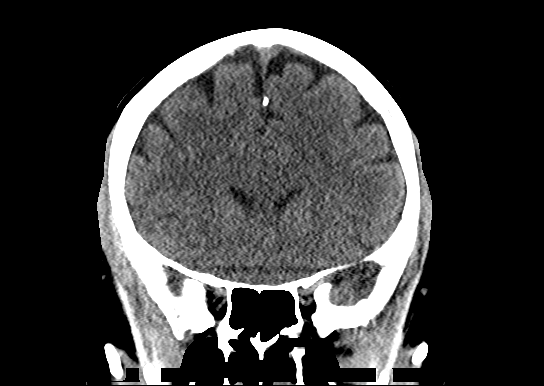
[im 30/68  brain]
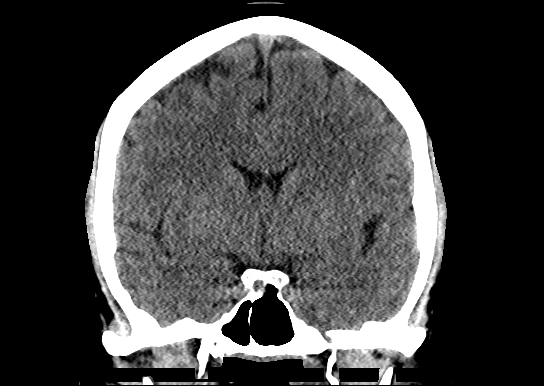
[im 38/68  brain]
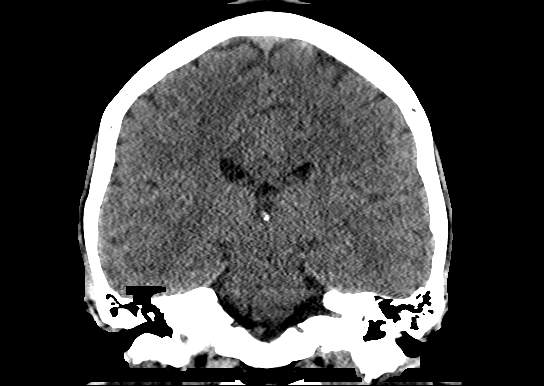

[Series 204: sagittal st, idose (1) · sagittal · 0.40mm/px · 3 of 69 slices shown]
[im 23/69  brain]
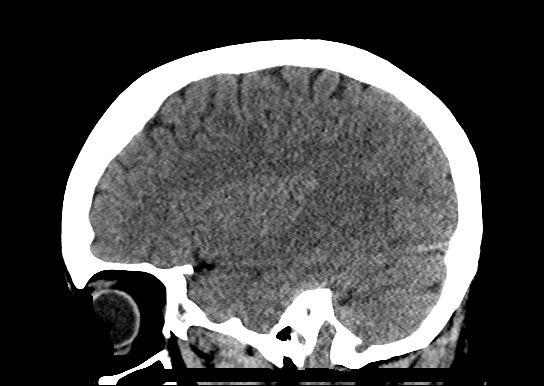
[im 35/69  brain]
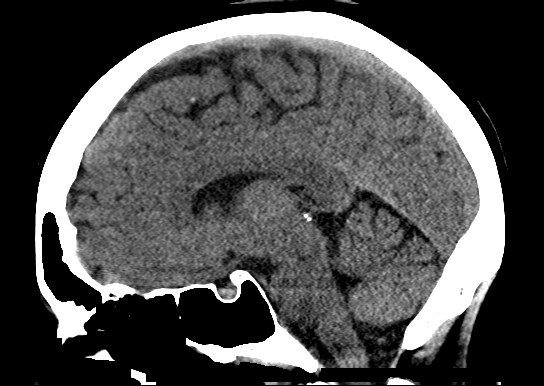
[im 46/69  brain]
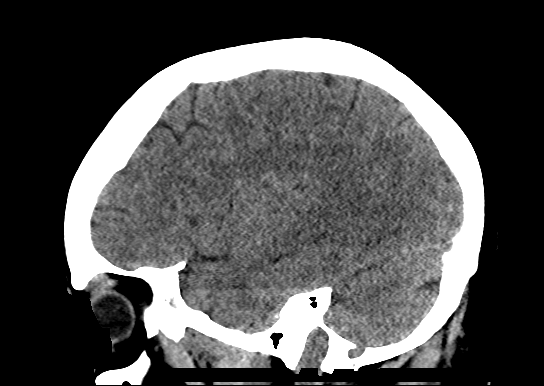

[18 of 47 positions shown; findings below may reference images not displayed]

FINDINGS: Brain: The ventricles are normal in size and configuration. There is
no intracranial mass, hemorrhage, extra-axial fluid collection, or
midline shift. Gray-white compartments appear normal. No acute
infarct evident.

Vascular: No hyperdense vessels.  No vascular calcification evident.

Skull: The bony calvarium appears intact.

Sinuses/Orbits: Visualized paranasal sinuses clear. Orbits appear
symmetric bilaterally.

Other: Mastoid air cells are clear.
IMPRESSION: Study within normal limits.

## 2015-12-20 IMAGING — RF DG FLUORO GUIDE LUMBAR PUNCTURE
1 series · 1 of 1 positions shown · non-contrast
Comparison: none

CLINICAL DATA: Headaches.  Neck pain.  Fever.

[Series 1: cp_standard · 0.18mm/px · 1 of 1 slices shown]
[im 1/1]
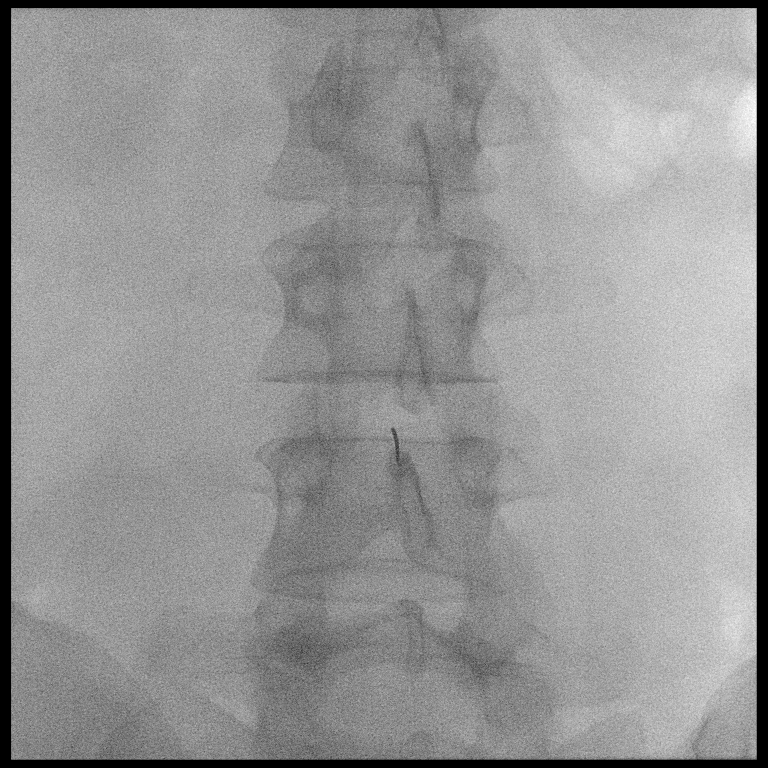

[1 of 1 positions shown; findings below may reference images not displayed]

EXAM:
DIAGNOSTIC LUMBAR PUNCTURE UNDER FLUOROSCOPIC GUIDANCE

FLUOROSCOPY TIME:  Fluoroscopy Time:  0 minutes and 36 seconds

Number of Acquired Spot Images: 0

PROCEDURE:
Informed consent was obtained from the patient prior to the
procedure, including potential complications of headache, allergy,
and pain. With the patient prone, the lower back was prepped with
Betadine. 1% Lidocaine was used for local anesthesia. Lumbar
puncture was performed at the L3-4 level using a 20 gauge needle
with return of clear CSF with an opening pressure of 9 cm water.
ml of CSF were obtained for laboratory studies. The patient
tolerated the procedure well and there were no apparent
complications.
IMPRESSION: Non complicated lumbar puncture under fluoroscopic guidance, as
detailed above.

## 2015-12-20 MED ORDER — METOCLOPRAMIDE HCL 5 MG/ML IJ SOLN
10.0000 mg | Freq: Once | INTRAMUSCULAR | Status: AC
  Administered 2015-12-20: 10 mg via INTRAVENOUS
  Filled 2015-12-20: qty 2

## 2015-12-20 MED ORDER — DEXTROSE 5 % IV SOLN
500.0000 mg | INTRAVENOUS | Status: DC
  Administered 2015-12-20: 500 mg via INTRAVENOUS
  Filled 2015-12-20: qty 500

## 2015-12-20 MED ORDER — LIDOCAINE HCL (PF) 1 % IJ SOLN
INTRAMUSCULAR | Status: AC
  Administered 2015-12-20: 5 mL
  Filled 2015-12-20: qty 30

## 2015-12-20 MED ORDER — SODIUM CHLORIDE 0.9 % IV BOLUS (SEPSIS): 1000.0000 mL | Freq: Once | INTRAVENOUS | Status: DC

## 2015-12-20 MED ORDER — DEXAMETHASONE SODIUM PHOSPHATE 10 MG/ML IJ SOLN
10.0000 mg | Freq: Once | INTRAMUSCULAR | Status: AC
  Administered 2015-12-20: 10 mg via INTRAVENOUS
  Filled 2015-12-20: qty 1

## 2015-12-20 MED ORDER — AMOXICILLIN-POT CLAVULANATE 875-125 MG PO TABS: 1.0000 | ORAL_TABLET | Freq: Two times a day (BID) | ORAL | 0 refills | Status: AC

## 2015-12-20 MED ORDER — KETOROLAC TROMETHAMINE 30 MG/ML IJ SOLN
15.0000 mg | Freq: Once | INTRAMUSCULAR | Status: AC
  Administered 2015-12-20: 15 mg via INTRAVENOUS
  Filled 2015-12-20: qty 1

## 2015-12-20 MED ORDER — MIDAZOLAM HCL 2 MG/2ML IJ SOLN
2.0000 mg | Freq: Once | INTRAMUSCULAR | Status: AC
  Administered 2015-12-20: 2 mg via INTRAVENOUS
  Filled 2015-12-20: qty 2

## 2015-12-20 MED ORDER — SODIUM CHLORIDE 0.9 % IV BOLUS (SEPSIS)
1000.0000 mL | Freq: Once | INTRAVENOUS | Status: AC
  Administered 2015-12-20: 1000 mL via INTRAVENOUS

## 2015-12-20 MED ORDER — BENZONATATE 100 MG PO CAPS: 100.0000 mg | ORAL_CAPSULE | Freq: Three times a day (TID) | ORAL | 0 refills | Status: DC | PRN

## 2015-12-20 MED ORDER — LIDOCAINE HCL (PF) 1 % IJ SOLN
5.0000 mL | Freq: Once | INTRAMUSCULAR | Status: AC
  Administered 2015-12-20: 5 mL

## 2015-12-20 MED ORDER — POTASSIUM CHLORIDE CRYS ER 20 MEQ PO TBCR
40.0000 meq | EXTENDED_RELEASE_TABLET | Freq: Once | ORAL | Status: AC
  Administered 2015-12-20: 40 meq via ORAL
  Filled 2015-12-20: qty 2

## 2015-12-20 MED ORDER — DEXTROSE 5 % IV SOLN
2.0000 g | Freq: Once | INTRAVENOUS | Status: AC
  Administered 2015-12-20: 2 g via INTRAVENOUS
  Filled 2015-12-20: qty 2

## 2015-12-20 MED ORDER — DOXYCYCLINE HYCLATE 100 MG PO CAPS: 100.0000 mg | ORAL_CAPSULE | Freq: Two times a day (BID) | ORAL | 0 refills | Status: AC

## 2015-12-20 MED ORDER — DIPHENHYDRAMINE HCL 50 MG/ML IJ SOLN
25.0000 mg | Freq: Once | INTRAMUSCULAR | Status: AC
  Administered 2015-12-20: 10:00:00 via INTRAVENOUS
  Filled 2015-12-20: qty 1

## 2015-12-20 NOTE — ED Notes (Signed)
Pt ambulated to restroom. 

## 2015-12-20 NOTE — ED Provider Notes (Signed)
[facility]-EMERGENCY DEPT Provider Note   CSN: [id number] Arrival date & time: 12/20/15  0814     History   Chief Complaint Chief Complaint  Patient presents with  . Headache  . Fever    HPI Julia [name] is a 40 y.o. female.  HPI 50 on female with past medical history as below who presents with cough, fever, headache, and neck stiffness. The patient's symptoms started approximately 4 days ago. Her symptoms started with diffuse myalgias, cough, and generalized fatigue. Over the last 2 days, patient has had progressively worsening, now severe, headache with neck stiffness. She states that she has photophobia as well as a headache that radiates down her neck to her lower back. She is also had fevers up to 101 at home. She has no known sick contacts and no one else in the family is sick. She was seen by her doctor today, he did a flu strip which was negative. She was subsequent sent for evaluation of meningitis. She has no history of meningitis. She endorses intermittent blurry vision but no focal weakness or numbness. She does have history of seizures but denies any recent seizures. She has not missed any doses of her Topamax. Denies any abdominal pain, nausea, or vomiting. She describes pain as an aching, throbbing, generalized headache.  Past Medical History:  Diagnosis Date  . ADHD (attention deficit hyperactivity disorder)   . Anorexia    history of eating disorder  . Anxiety   . Obsessive compulsive disorder   . OCD (obsessive compulsive disorder)   . Seizures (HCC) 05/17/14    Patient Active Problem List   Diagnosis Date Noted  . Partial symptomatic epilepsy with complex partial seizures, not intractable, without status epilepticus (HCC) 06/20/2014  . 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 Obsessive compulsive disorder   . Viral illness 05/19/2010  . Anal fissure 05/18/2010  . Normocytic anemia 05/18/2010  . Anxiety   . OCD (obsessive compulsive disorder)   . ADHD  (attention deficit hyperactivity disorder)   . Anorexia     Past Surgical History:  Procedure Laterality Date  . LIPOSUCTION TRUNK      OB History    Gravida Para Term Preterm AB Living   3 3       3    SAB TAB Ectopic Multiple Live Births           3       Home Medications    Prior to Admission medications   Medication Sig Start Date End Date Taking? Authorizing Provider  acetaminophen (TYLENOL) 325 MG tablet Take 650 mg by mouth every 6 (six) hours as needed for headache (or pain).    Yes Historical Provider, MD  ALPRAZolam Prudy Feeler) 1 MG tablet Take 1 mg by mouth daily as needed for anxiety. For anxiety   Yes Historical Provider, MD  amphetamine-dextroamphetamine (ADDERALL XR) 25 MG 24 hr capsule Take 50 mg by mouth every morning.     Yes Historical Provider, MD  amphetamine-dextroamphetamine (ADDERALL) 30 MG tablet Take 30 mg by mouth daily as needed (ADHD).    Yes Milagros Evener, MD  aspirin-acetaminophen-caffeine (EXCEDRIN MIGRAINE) [phone] MG per tablet Take 2 tablets by mouth every 6 (six) hours as needed for headache.   Yes Historical Provider, MD  FETZIMA 120 MG CP24 Take 120 mg by mouth daily. 12/09/15  Yes Historical Provider, MD  FLUoxetine (PROZAC) 20 MG capsule Take 20 mg by mouth daily. 10/16/15  Yes Historical Provider, MD  ibuprofen (ADVIL,MOTRIN) 400 MG  tablet Take 400 mg by mouth every 6 (six) hours as needed. To take with food   Yes Historical Provider, MD  NP THYROID 30 MG tablet Take 30 mg by mouth daily. 12/11/15  Yes Historical Provider, MD  topiramate (TOPAMAX) 100 MG tablet Take 150 mg by mouth 2 (two) times daily. 11/28/15  Yes Historical Provider, MD  amoxicillin-clavulanate (AUGMENTIN) 875-125 MG tablet Take 1 tablet by mouth 2 (two) times daily. 12/20/15 12/30/15  Shaune Pollack, MD  benzonatate (TESSALON PERLES) 100 MG capsule Take 1 capsule (100 mg total) by mouth 3 (three) times daily as needed for cough. 12/20/15   Shaune Pollack, MD  carbamazepine  (TEGRETOL XR) 200 MG 12 hr tablet Take 1-2 tablets (200-400 mg total) by mouth 2 (two) times daily. Patient not taking: Reported on 12/20/2015 08/03/14   Suanne Marker, MD  doxycycline (VIBRAMYCIN) 100 MG capsule Take 1 capsule (100 mg total) by mouth 2 (two) times daily. 12/20/15 12/30/15  Shaune Pollack, MD    Family History Family History  Problem Relation Age of Onset  . Parkinsonism Mother   . Hypertension Father   . Stroke Father   . Lymphoma Father   . Rheum arthritis Maternal Grandfather     Social History Social History  Substance Use Topics  . Smoking status: Never Smoker  . Smokeless tobacco: Never Used  . Alcohol use 0.6 oz/week    1 Glasses of wine per week     Allergies   Ciprofloxacin and Percocet [oxycodone-acetaminophen]   Review of Systems Review of Systems  Constitutional: Positive for fatigue. Negative for chills and fever.  HENT: Negative for congestion and rhinorrhea.   Eyes: Negative for visual disturbance.  Respiratory: Positive for cough. Negative for shortness of breath and wheezing.   Cardiovascular: Negative for chest pain and leg swelling.  Gastrointestinal: Negative for abdominal pain, diarrhea, nausea and vomiting.  Genitourinary: Negative for dysuria and flank pain.  Musculoskeletal: Positive for arthralgias, myalgias, neck pain and neck stiffness.  Skin: Negative for rash and wound.  Allergic/Immunologic: Negative for immunocompromised state.  Neurological: Positive for headaches. Negative for syncope and weakness.  All other systems reviewed and are negative.    Physical Exam Updated Vital Signs BP 102/74   Pulse 80   Temp 98.4 F (36.9 C) (Oral)   Resp 20   Ht 6\' 1"  (1.854 m)   Wt 207 lb (93.9 kg)   LMP 12/19/2015   SpO2 100%   BMI 27.31 kg/m   Physical Exam  Constitutional: She is oriented to person, place, and time. She appears well-developed and well-nourished. No distress.  HENT:  Head: Normocephalic and  atraumatic.  Mouth/Throat: Posterior oropharyngeal erythema present.  Dry MM  Eyes: Conjunctivae are normal.  Neck: Neck supple.  Mild neck stiffness with flexion of neck but ROM otherwise full, minimally painful. Negative Kernig's and Brudzinski's.  Cardiovascular: Normal rate, regular rhythm and normal heart sounds.  Exam reveals no friction rub.   No murmur heard. Pulmonary/Chest: Effort normal. No respiratory distress. She has no wheezes. She has rhonchi in the right lower field and the left lower field. She has no rales.  Abdominal: Soft. Bowel sounds are normal. She exhibits no distension.  Musculoskeletal: She exhibits no edema.  Neurological: She is alert and oriented to person, place, and time. She has normal strength. No cranial nerve deficit or sensory deficit. She exhibits normal muscle tone. Coordination and gait normal. GCS eye subscore is 4. GCS verbal subscore is 5. GCS motor  subscore is 6.  Skin: Skin is warm. Capillary refill takes less than 2 seconds.  Psychiatric: She has a normal mood and affect.  Nursing note and vitals reviewed.    ED Treatments / Results  Labs (all labs ordered are listed, but only abnormal results are displayed) Labs Reviewed  CBC WITH DIFFERENTIAL/PLATELET - Abnormal; Notable for the following:       Result Value   Hemoglobin 10.8 (*)    HCT 33.4 (*)    MCH 25.6 (*)    Neutro Abs 8.7 (*)    All other components within normal limits  COMPREHENSIVE METABOLIC PANEL - Abnormal; Notable for the following:    Potassium 3.1 (*)    CO2 18 (*)    Glucose, Bld 119 (*)    BUN <5 (*)    Calcium 8.4 (*)    Albumin 3.2 (*)    AST 100 (*)    ALT 72 (*)    Alkaline Phosphatase 149 (*)    Total Bilirubin 0.1 (*)    All other components within normal limits  CSF CELL COUNT WITH DIFFERENTIAL - Abnormal; Notable for the following:    RBC Count, CSF 35 (*)    All other components within normal limits  CSF CELL COUNT WITH DIFFERENTIAL - Abnormal;  Notable for the following:    RBC Count, CSF 1 (*)    All other components within normal limits  GLUCOSE, CSF - Abnormal; Notable for the following:    Glucose, CSF 72 (*)    All other components within normal limits  CSF CULTURE  CULTURE, BLOOD (ROUTINE X 2)  CULTURE, BLOOD (ROUTINE X 2)  GRAM STAIN  CSF CULTURE  PROTEIN, CSF  I-STAT CG4 LACTIC ACID, ED  I-STAT BETA HCG BLOOD, ED ([facility], [facility], [facility] ONLY)  I-STAT CG4 LACTIC ACID, ED    EKG  EKG Interpretation None       Radiology Dg Chest 2 View  Result Date: 12/20/2015 CLINICAL DATA:  Headache, neck pain and fever since 12/17/2015. EXAM: CHEST  2 VIEW COMPARISON:  PA and lateral chest 05/18/2010. FINDINGS: There is dense left lower lobe airspace disease consistent with pneumonia. The right lung is clear. No pneumothorax or pleural fluid. Heart size is normal. IMPRESSION: Left lower lobe pneumonia.  Recommend followup to clearing. Electronically Signed   By: Drusilla Kannerhomas  Dalessio M.D.   On: 12/20/2015 10:35   Ct Head Wo Contrast  Result Date: 12/20/2015 CLINICAL DATA:  Headache with dizziness for 3 days EXAM: CT HEAD WITHOUT CONTRAST TECHNIQUE: Contiguous axial images were obtained from the base of the skull through the vertex without intravenous contrast. COMPARISON:  None. FINDINGS: Brain: The ventricles are normal in size and configuration. There is no intracranial mass, hemorrhage, extra-axial fluid collection, or midline shift. Gray-white compartments appear normal. No acute infarct evident. Vascular: No hyperdense vessels.  No vascular calcification evident. Skull: The bony calvarium appears intact. Sinuses/Orbits: Visualized paranasal sinuses clear. Orbits appear symmetric bilaterally. Other: Mastoid air cells are clear. IMPRESSION: Study within normal limits. Electronically Signed   By: Bretta BangWilliam  Woodruff III M.D.   On: 12/20/2015 11:14   Dg Lumbar Puncture Fluoro Guide  Result Date: 12/20/2015 CLINICAL DATA:  Headaches.  Neck pain.   Fever. EXAM: DIAGNOSTIC LUMBAR PUNCTURE UNDER FLUOROSCOPIC GUIDANCE FLUOROSCOPY TIME:  Fluoroscopy Time:  0 minutes and 36 seconds Number of Acquired Spot Images: 0 PROCEDURE: Informed consent was obtained from the patient prior to the procedure, including potential complications of headache, allergy, and pain.  With the patient prone, the lower back was prepped with Betadine. 1% Lidocaine was used for local anesthesia. Lumbar puncture was performed at the L3-4 level using a 20 gauge needle with return of clear CSF with an opening pressure of 9 cm water. 7.5 ml of CSF were obtained for laboratory studies. The patient tolerated the procedure well and there were no apparent complications. IMPRESSION: Non complicated lumbar puncture under fluoroscopic guidance, as detailed above. Electronically Signed   By: Jeronimo GreavesKyle  Talbot M.D.   On: 12/20/2015 15:45    Procedures .Lumbar Puncture Date/Time: 12/20/2015 4:11 PM Performed by: Shaune PollackISAACS, Altan Kraai Authorized by: Shaune PollackISAACS, Lena Gores   Consent:    Consent obtained:  Verbal and written   Consent given by:  Patient   Risks discussed:  Headache, bleeding, infection, nerve damage, repeat procedure and pain   Alternatives discussed:  Delayed treatment, alternative treatment and no treatment Pre-procedure details:    Procedure purpose:  Diagnostic   Preparation: Patient was prepped and draped in usual sterile fashion   Sedation:    Sedation type:  Anxiolysis Anesthesia (see MAR for exact dosages):    Anesthesia method:  Local infiltration   Local anesthetic:  Lidocaine 1% w/o epi Procedure details:    Lumbar space:  L4-L5 interspace   Patient position:  L lateral decubitus   Needle gauge:  20   Needle type:  Spinal needle - Quincke tip   Needle length (in):  3.5   Ultrasound guidance: no     Number of attempts:  3 Post-procedure:    Puncture site:  Adhesive bandage applied   Patient tolerance of procedure:  Tolerated with difficulty   (including critical care  time)  Medications Ordered in ED Medications  sodium chloride 0.9 % bolus 1,000 mL (0 mLs Intravenous Stopped 12/20/15 1134)  ketorolac (TORADOL) 30 MG/ML injection 15 mg (15 mg Intravenous Given 12/20/15 1009)  metoCLOPramide (REGLAN) injection 10 mg (10 mg Intravenous Given 12/20/15 1009)  diphenhydrAMINE (BENADRYL) injection 25 mg ( Intravenous Given 12/20/15 1009)  sodium chloride 0.9 % bolus 1,000 mL (0 mLs Intravenous Stopped 12/20/15 1738)  cefTRIAXone (ROCEPHIN) 2 g in dextrose 5 % 50 mL IVPB (0 g Intravenous Stopped 12/20/15 1221)  dexamethasone (DECADRON) injection 10 mg (10 mg Intravenous Given 12/20/15 1122)  potassium chloride SA (K-DUR,KLOR-CON) CR tablet 40 mEq (40 mEq Oral Given 12/20/15 1306)  midazolam (VERSED) injection 2 mg (2 mg Intravenous Given 12/20/15 1125)  lidocaine (PF) (XYLOCAINE) 1 % injection 5 mL (5 mLs Other Given 12/20/15 1529)     Initial Impression / Assessment and Plan / ED Course  I have reviewed the triage vital signs and the nursing notes.  Pertinent labs & imaging results that were available during my care of the patient were reviewed by me and considered in my medical decision making (see chart for details).  Clinical Course    40 yo F with no significant PMHx here with fever, myalgias, cough, and now headache with neck stiffness. On arrival, pt afebrile, VSS and at baseline per pt (normal BP 90-110). She appears to feel unwell but is non-toxic. Neuro exam is non-focal. Primary suspicion is viral URI, influenza-like illness, CAP. However, given her HA and reported neck stiffness, c/f possible viral versus bacterial meningitis. [facility]. Will obtain screening labs, start IVF, and obtain CXR. Pt consented for LP.  Labs as above and overall very reassuring. Normal WBC. Normal LA. CXR shows significant LLL PNA but pt is o/w well appearing, satting well  on RA. Normal WOB. LP attempted by myself x 3 without success, so pt taken to fluoro with  successful LP. Awaiting CSF results. Rocephin/azithro given.  LP results show normal WBC, normal RBC on tube 4 - doubt meningitis or encephalitis. Pt markedly improved after IVF and ABX, and is requesting to go home. Low CUR-65 score, no comorbidities, and she continues to sat well on RA. Will d/c with outpt ABX for CAP, close PCP f/u, and good return precautions.   Final Clinical Impressions(s) / ED Diagnoses   Final diagnoses:  Community acquired pneumonia of left lower lobe of lung (HCC)    New Prescriptions Discharge Medication List as of 12/20/2015  5:03 PM    START taking these medications   Details  amoxicillin-clavulanate (AUGMENTIN) 875-125 MG tablet Take 1 tablet by mouth 2 (two) times daily., Starting Fri 12/20/2015, Until Mon 12/30/2015, Print    benzonatate (TESSALON PERLES) 100 MG capsule Take 1 capsule (100 mg total) by mouth 3 (three) times daily as needed for cough., Starting Fri 12/20/2015, Print    doxycycline (VIBRAMYCIN) 100 MG capsule Take 1 capsule (100 mg total) by mouth 2 (two) times daily., Starting Fri 12/20/2015, Until Mon 12/30/2015, Print         Shaune Pollack, MD 12/21/15 [phone]

## 2015-12-20 NOTE — Procedures (Signed)
EXAM:  DIAGNOSTIC LUMBAR PUNCTURE UNDER FLUOROSCOPIC GUIDANCE FLUOROSCOPY TIME: Fluoroscopy Time:  [0 minutes and 36 seconds]   Number of Acquired Spot Images: [0] PROCEDURE:  Informed consent was obtained from the patient prior to the procedure, including potential complications of headache, allergy, and pain.  With the patient prone, the lower back was prepped with Betadine.  1% Lidocaine was used for local anesthesia.  Lumbar puncture was performed at the [L3-4] level using a [20] gauge needle with return of [clear] CSF with an opening pressure of [9] cm water.  [7.5] ml of CSF were obtained for laboratory studies.  The patient tolerated the procedure well and there were no apparent complications.   IMPRESSION: [Non complicated lumbar puncture under fluoroscopic guidance, as detailed above.]

## 2015-12-20 NOTE — ED Notes (Signed)
Pt to xray

## 2015-12-20 NOTE — ED Triage Notes (Signed)
Pt sent here by PCP for possible meningitis. Pt has headache, neck pain and fever at home. Pt reports she can only get comfortable while lying flat.

## 2015-12-24 LAB — CSF CULTURE W GRAM STAIN: Culture: NO GROWTH

## 2015-12-25 LAB — CULTURE, BLOOD (ROUTINE X 2)
Culture: NO GROWTH
Culture: NO GROWTH

## 2015-12-31 DIAGNOSIS — Z0000 Encounter for general adult medical examination without abnormal findings: Secondary | ICD-10-CM | POA: Diagnosis not present

## 2016-05-26 DIAGNOSIS — F900 Attention-deficit hyperactivity disorder, predominantly inattentive type: Secondary | ICD-10-CM | POA: Diagnosis not present

## 2016-05-26 DIAGNOSIS — F3342 Major depressive disorder, recurrent, in full remission: Secondary | ICD-10-CM | POA: Diagnosis not present

## 2016-11-27 DIAGNOSIS — F331 Major depressive disorder, recurrent, moderate: Secondary | ICD-10-CM | POA: Diagnosis not present

## 2016-11-27 DIAGNOSIS — F900 Attention-deficit hyperactivity disorder, predominantly inattentive type: Secondary | ICD-10-CM | POA: Diagnosis not present

## 2017-01-06 DIAGNOSIS — F3342 Major depressive disorder, recurrent, in full remission: Secondary | ICD-10-CM | POA: Diagnosis not present

## 2017-01-06 DIAGNOSIS — F900 Attention-deficit hyperactivity disorder, predominantly inattentive type: Secondary | ICD-10-CM | POA: Diagnosis not present

## 2017-01-28 DIAGNOSIS — G40909 Epilepsy, unspecified, not intractable, without status epilepticus: Secondary | ICD-10-CM | POA: Diagnosis not present

## 2017-01-28 DIAGNOSIS — Z79899 Other long term (current) drug therapy: Secondary | ICD-10-CM | POA: Diagnosis not present

## 2017-01-28 DIAGNOSIS — F429 Obsessive-compulsive disorder, unspecified: Secondary | ICD-10-CM | POA: Diagnosis not present

## 2017-01-28 DIAGNOSIS — F419 Anxiety disorder, unspecified: Secondary | ICD-10-CM | POA: Diagnosis not present

## 2017-03-02 DIAGNOSIS — N920 Excessive and frequent menstruation with regular cycle: Secondary | ICD-10-CM | POA: Diagnosis not present

## 2017-03-02 DIAGNOSIS — Z01419 Encounter for gynecological examination (general) (routine) without abnormal findings: Secondary | ICD-10-CM | POA: Diagnosis not present

## 2017-03-02 DIAGNOSIS — Z1151 Encounter for screening for human papillomavirus (HPV): Secondary | ICD-10-CM | POA: Diagnosis not present

## 2017-03-02 DIAGNOSIS — Z6828 Body mass index (BMI) 28.0-28.9, adult: Secondary | ICD-10-CM | POA: Diagnosis not present

## 2017-03-19 ENCOUNTER — Encounter: Payer: Self-pay | Admitting: Gastroenterology

## 2017-03-19 ENCOUNTER — Telehealth: Payer: Self-pay | Admitting: Internal Medicine

## 2017-03-19 NOTE — Telephone Encounter (Signed)
See below and advise

## 2017-03-19 NOTE — Telephone Encounter (Signed)
Ok to see me Patient states she needs sooner than available appt with me Can be added when I have a cancellation or offered a sooner visit with an APP as a way to get in quicker, and I will accept her via the APP visit

## 2017-03-19 NOTE — Telephone Encounter (Signed)
See comments from Dr. Pyrtle. 

## 2017-03-19 NOTE — Telephone Encounter (Signed)
Patient has been scheduled for an office visit with an APP on 03/31/17.

## 2017-03-23 DIAGNOSIS — N920 Excessive and frequent menstruation with regular cycle: Secondary | ICD-10-CM | POA: Diagnosis not present

## 2017-03-31 ENCOUNTER — Encounter: Payer: Self-pay | Admitting: Gastroenterology

## 2017-03-31 ENCOUNTER — Ambulatory Visit: Payer: BLUE CROSS/BLUE SHIELD | Admitting: Gastroenterology

## 2017-03-31 VITALS — BP 104/74 | HR 92 | Ht 72.0 in | Wt 214.5 lb

## 2017-03-31 DIAGNOSIS — K5909 Other constipation: Secondary | ICD-10-CM | POA: Diagnosis not present

## 2017-03-31 DIAGNOSIS — K6289 Other specified diseases of anus and rectum: Secondary | ICD-10-CM | POA: Diagnosis not present

## 2017-03-31 DIAGNOSIS — K219 Gastro-esophageal reflux disease without esophagitis: Secondary | ICD-10-CM

## 2017-03-31 DIAGNOSIS — K602 Anal fissure, unspecified: Secondary | ICD-10-CM | POA: Diagnosis not present

## 2017-03-31 MED ORDER — PANTOPRAZOLE SODIUM 40 MG PO TBEC: 40.0000 mg | DELAYED_RELEASE_TABLET | Freq: Every day | ORAL | 5 refills | Status: DC

## 2017-03-31 MED ORDER — AMBULATORY NON FORMULARY MEDICATION: 2 refills | Status: DC

## 2017-03-31 NOTE — Patient Instructions (Addendum)
You can try miralax twice daily.   We have given you a handout on GERD.  You have been scheduled for an endoscopy. Please follow written instructions given to you at your visit today. If you use inhalers (even only as needed), please bring them with you on the day of your procedure. Your physician has requested that you go to www.[email or web address] and enter the access code given to you at your visit today. This web site gives a general overview about your procedure. However, you should still follow specific instructions given to you by our office regarding your preparation for the procedure.  We have sent the following medications to your pharmacy for you to pick up at your convenience:  Pantoprazole 40 mg daily  We have sent a prescription [facility] nitroglycerin 0.125% [facility]. You should apply a pea size amount with a gloved finger up to your first knuckle (just after your fingernail) to your rectum twice a day x 6-8 weeks.  [facility] information is below: Address: [address], [place], [place] [address]  [phone]  *Please DO NOT go directly from our office to pick up this medication! Give the pharmacy 1 day to process the prescription as this is compounded at takes time to make.

## 2017-03-31 NOTE — Progress Notes (Addendum)
03/31/2017 Julia [name] [id number] 1975-12-09   HISTORY OF PRESENT ILLNESS: This is a 42 year old female who is new to our office.  She presents here today with multiple complaints.  First, she tells me that she has long-standing issues with constipation.  She says that she only has a bowel movement every 7-10 days and when she does have a bowel and is very large and hard.  So much so that it clogs the toilet every single time she has a bowel movement.  She has not tried taking anything to help her move her bowels until just recently when she began taking 4 stool softeners daily.  Her gynecologist placed her on some iron supplements for menorrhagia and anemia related to that so he told her to begin taking a daily stool softener.  She has a hemoglobin of 9.7 g with an MCV of 74.4.  She has severe menorrhagia and actually is having an ablation in the near future.  She reports severe pain in her rectum like a knife or glass is cutting her when she has a bowel movement.  Reports very small amounts of bright red blood on the stool or on the toilet paper and also some mucus as well.    She also reports severe heartburn/reflux.  She says that this has been going on since September.  She says that the reflux is constant.  She tells me that she tried cutting out gluten, lactose, carbs, sugars to relieve symptoms but none of that has helped.  The only thing she is taking for her heartburn is some generic Tums and says that she is taking multiple of those daily.  She denies any dysphasia.  Past Medical History:  Diagnosis Date  . ADHD (attention deficit hyperactivity disorder)   . Anal fissure   . Anemia   . Anorexia    history of eating disorder  . Anxiety   . Epilepsy (HCC)   . Obsessive compulsive disorder   . OCD (obsessive compulsive disorder)   . Seizures (HCC) 05/17/14   Past Surgical History:  Procedure Laterality Date  . LIPOSUCTION TRUNK    . TONSILLECTOMY      reports that  has never  smoked. she has never used smokeless tobacco. She reports that she drinks about 0.6 oz of alcohol per week. She reports that she does not use drugs. family history includes Diabetes in her maternal grandmother; Hypertension in her father; Lymphoma (age of onset: 73) in her father; Parkinsonism (age of onset: 12) in her mother; Rheum arthritis in her maternal grandfather; Stroke in her father. Allergies  Allergen Reactions  . Ciprofloxacin Hives  . Percocet [Oxycodone-Acetaminophen] Nausea Only      Outpatient Encounter Medications as of 03/31/2017  Medication Sig  . ALPRAZolam (XANAX) 1 MG tablet Take 1 mg by mouth daily as needed for anxiety. For anxiety  . amphetamine-dextroamphetamine (ADDERALL XR) 25 MG 24 hr capsule Take 50 mg by mouth every morning.    Marland Kitchen amphetamine-dextroamphetamine (ADDERALL) 30 MG tablet Take 30 mg by mouth daily as needed (ADHD).   Marland Kitchen aspirin-acetaminophen-caffeine (EXCEDRIN MIGRAINE) 250-250-65 MG per tablet Take 2 tablets by mouth every 6 (six) hours as needed for headache.  . benzonatate (TESSALON PERLES) 100 MG capsule Take 1 capsule (100 mg total) by mouth 3 (three) times daily as needed for cough.  Marland Kitchen buPROPion (WELLBUTRIN XL) 150 MG 24 hr tablet Take 1 tablet by mouth daily.  Marland Kitchen buPROPion (WELLBUTRIN XL) 300 MG 24 hr  tablet Take 1 tablet by mouth daily.  Marland Kitchen. FLUoxetine (PROZAC) 20 MG capsule Take 20 mg by mouth daily.  Marland Kitchen. ibuprofen (ADVIL,MOTRIN) 400 MG tablet Take 400 mg by mouth every 6 (six) hours as needed. To take with food  . topiramate (TOPAMAX) 100 MG tablet Take 200 mg by mouth 2 (two) times daily.   Marland Kitchen. acetaminophen (TYLENOL) 325 MG tablet Take 650 mg by mouth every 6 (six) hours as needed for headache (or pain).   . NP THYROID 30 MG tablet Take 30 mg by mouth daily.  . [DISCONTINUED] carbamazepine (TEGRETOL XR) 200 MG 12 hr tablet Take 1-2 tablets (200-400 mg total) by mouth 2 (two) times daily. (Patient not taking: Reported on 12/20/2015)  . [DISCONTINUED]  FETZIMA 120 MG CP24 Take 120 mg by mouth daily.   No facility-administered encounter medications on file as of 03/31/2017.      REVIEW OF SYSTEMS  : All other systems reviewed and negative except where noted in the History of Present Illness.   PHYSICAL EXAM: BP 104/74 (BP Location: Left Arm, Patient Position: Sitting, Cuff Size: Normal)   Pulse 92   Ht 6' (1.829 m) Comment: height measured without shoes  Wt 214 lb 8 oz (97.3 kg)   LMP 03/17/2017   BMI 29.09 kg/m  General: Well developed white female in no acute distress Head: Normocephalic and atraumatic Eyes:  Sclerae anicteric, conjunctiva pink. Ears: Normal auditory acuity Lungs: Clear throughout to auscultation; no increased WOB. Heart: Regular rate and rhythm; no M/R/G. Abdomen: Soft, non-distended.  BS present.  Non-tender. Rectal:  No external abnormalities noted.  DRE revealed tenderness posteriorly, but no masses noted.  Light brown stool on exam glove.  Anoscopy was performed and revealed a posterior anal fissure.  Musculoskeletal: Symmetrical with no gross deformities  Skin: No lesions on visible extremities Extremities: No edema  Neurological: Alert oriented x 4, grossly nonfocal Psychological:  Alert and cooperative. Normal mood and affect  ASSESSMENT AND PLAN: *GERD:  Really has not tried much for her symptoms so will place her on pantoprazole 40 mg daily.  Since she's had such long-standing and severe symptoms we will schedule for EGD with Dr. Rhea BeltonPyrtle at patient's request.  GERD dietary literature given to the patient as well. *Constipation:  Long-standing.  Will begin Miralax BID to start. *Anal fissure:  Seen on exam today.  Will treat with nitro gel 0.125% BID for 8-10 weeks.  Need to keeps stools soft and moving more regularly.  **The risks, benefits, and alternatives to EGD were discussed with the patient and she consents to proceed.   CC:  Martha ClanShaw, William, MD   Addendum: Reviewed and agree with initial  management. Pyrtle, Carie CaddyJay M, MD

## 2017-04-13 ENCOUNTER — Encounter: Payer: Self-pay | Admitting: Internal Medicine

## 2017-04-16 DIAGNOSIS — F3342 Major depressive disorder, recurrent, in full remission: Secondary | ICD-10-CM | POA: Diagnosis not present

## 2017-04-16 DIAGNOSIS — F900 Attention-deficit hyperactivity disorder, predominantly inattentive type: Secondary | ICD-10-CM | POA: Diagnosis not present

## 2017-04-19 DIAGNOSIS — N920 Excessive and frequent menstruation with regular cycle: Secondary | ICD-10-CM | POA: Diagnosis not present

## 2017-04-19 DIAGNOSIS — Z3202 Encounter for pregnancy test, result negative: Secondary | ICD-10-CM | POA: Diagnosis not present

## 2017-04-26 ENCOUNTER — Telehealth: Payer: Self-pay | Admitting: Internal Medicine

## 2017-04-26 NOTE — Telephone Encounter (Signed)
Hi Dr. Rhea BeltonPyrtle, this pt cancelled her EGD for tomorrow at 10:00am. She stated that she is feeling better and medication is working well so she prefers not to have procedure. Thank you.

## 2017-04-27 ENCOUNTER — Encounter: Payer: BLUE CROSS/BLUE SHIELD | Admitting: Internal Medicine

## 2017-05-24 DIAGNOSIS — N920 Excessive and frequent menstruation with regular cycle: Secondary | ICD-10-CM | POA: Diagnosis not present

## 2017-09-28 DIAGNOSIS — Z6824 Body mass index (BMI) 24.0-24.9, adult: Secondary | ICD-10-CM | POA: Diagnosis not present

## 2017-09-28 DIAGNOSIS — J329 Chronic sinusitis, unspecified: Secondary | ICD-10-CM | POA: Diagnosis not present

## 2017-09-28 DIAGNOSIS — J029 Acute pharyngitis, unspecified: Secondary | ICD-10-CM | POA: Diagnosis not present

## 2017-10-06 DIAGNOSIS — R590 Localized enlarged lymph nodes: Secondary | ICD-10-CM | POA: Diagnosis not present

## 2017-10-06 DIAGNOSIS — Z6824 Body mass index (BMI) 24.0-24.9, adult: Secondary | ICD-10-CM | POA: Diagnosis not present

## 2017-10-06 DIAGNOSIS — R569 Unspecified convulsions: Secondary | ICD-10-CM | POA: Diagnosis not present

## 2017-10-06 DIAGNOSIS — J029 Acute pharyngitis, unspecified: Secondary | ICD-10-CM | POA: Diagnosis not present

## 2017-10-08 DIAGNOSIS — D649 Anemia, unspecified: Secondary | ICD-10-CM | POA: Diagnosis not present

## 2017-10-11 DIAGNOSIS — F3342 Major depressive disorder, recurrent, in full remission: Secondary | ICD-10-CM | POA: Diagnosis not present

## 2017-10-11 DIAGNOSIS — F900 Attention-deficit hyperactivity disorder, predominantly inattentive type: Secondary | ICD-10-CM | POA: Diagnosis not present

## 2017-10-15 ENCOUNTER — Other Ambulatory Visit: Payer: Self-pay | Admitting: Gastroenterology

## 2017-10-15 DIAGNOSIS — K219 Gastro-esophageal reflux disease without esophagitis: Secondary | ICD-10-CM | POA: Diagnosis not present

## 2017-10-15 DIAGNOSIS — Z9089 Acquired absence of other organs: Secondary | ICD-10-CM | POA: Diagnosis not present

## 2017-10-26 DIAGNOSIS — R51 Headache: Secondary | ICD-10-CM | POA: Diagnosis not present

## 2017-10-26 DIAGNOSIS — G40109 Localization-related (focal) (partial) symptomatic epilepsy and epileptic syndromes with simple partial seizures, not intractable, without status epilepticus: Secondary | ICD-10-CM | POA: Diagnosis not present

## 2018-02-23 DIAGNOSIS — R5383 Other fatigue: Secondary | ICD-10-CM | POA: Diagnosis not present

## 2018-02-23 DIAGNOSIS — Z6824 Body mass index (BMI) 24.0-24.9, adult: Secondary | ICD-10-CM | POA: Diagnosis not present

## 2018-02-23 DIAGNOSIS — W57XXXA Bitten or stung by nonvenomous insect and other nonvenomous arthropods, initial encounter: Secondary | ICD-10-CM | POA: Diagnosis not present

## 2018-03-28 ENCOUNTER — Other Ambulatory Visit: Payer: Self-pay | Admitting: Gastroenterology

## 2018-04-05 DIAGNOSIS — F331 Major depressive disorder, recurrent, moderate: Secondary | ICD-10-CM | POA: Diagnosis not present

## 2018-04-19 DIAGNOSIS — Z0000 Encounter for general adult medical examination without abnormal findings: Secondary | ICD-10-CM | POA: Diagnosis not present

## 2018-04-19 DIAGNOSIS — R5383 Other fatigue: Secondary | ICD-10-CM | POA: Diagnosis not present

## 2018-04-19 DIAGNOSIS — R7301 Impaired fasting glucose: Secondary | ICD-10-CM | POA: Diagnosis not present

## 2018-04-22 DIAGNOSIS — Z0000 Encounter for general adult medical examination without abnormal findings: Secondary | ICD-10-CM | POA: Diagnosis not present

## 2018-04-22 DIAGNOSIS — D649 Anemia, unspecified: Secondary | ICD-10-CM | POA: Diagnosis not present

## 2018-04-22 DIAGNOSIS — R569 Unspecified convulsions: Secondary | ICD-10-CM | POA: Diagnosis not present

## 2018-04-22 DIAGNOSIS — R7301 Impaired fasting glucose: Secondary | ICD-10-CM | POA: Diagnosis not present

## 2018-04-22 DIAGNOSIS — R112 Nausea with vomiting, unspecified: Secondary | ICD-10-CM | POA: Diagnosis not present

## 2018-04-22 DIAGNOSIS — Z1331 Encounter for screening for depression: Secondary | ICD-10-CM | POA: Diagnosis not present

## 2018-05-02 ENCOUNTER — Other Ambulatory Visit: Payer: Self-pay | Admitting: Gastroenterology

## 2018-05-06 DIAGNOSIS — F332 Major depressive disorder, recurrent severe without psychotic features: Secondary | ICD-10-CM | POA: Diagnosis not present

## 2018-05-27 DIAGNOSIS — Z6826 Body mass index (BMI) 26.0-26.9, adult: Secondary | ICD-10-CM | POA: Diagnosis not present

## 2018-05-27 DIAGNOSIS — Z124 Encounter for screening for malignant neoplasm of cervix: Secondary | ICD-10-CM | POA: Diagnosis not present

## 2018-05-27 DIAGNOSIS — Z113 Encounter for screening for infections with a predominantly sexual mode of transmission: Secondary | ICD-10-CM | POA: Diagnosis not present

## 2018-05-27 DIAGNOSIS — Z01419 Encounter for gynecological examination (general) (routine) without abnormal findings: Secondary | ICD-10-CM | POA: Diagnosis not present

## 2018-05-27 DIAGNOSIS — F3281 Premenstrual dysphoric disorder: Secondary | ICD-10-CM | POA: Diagnosis not present

## 2018-06-05 ENCOUNTER — Other Ambulatory Visit: Payer: Self-pay | Admitting: Gastroenterology

## 2018-06-06 NOTE — Telephone Encounter (Signed)
Called and LM for pt that she needs a  Yearly f/u for refill

## 2018-06-13 DIAGNOSIS — G40909 Epilepsy, unspecified, not intractable, without status epilepticus: Secondary | ICD-10-CM | POA: Diagnosis not present

## 2018-07-17 ENCOUNTER — Other Ambulatory Visit: Payer: Self-pay | Admitting: Gastroenterology

## 2018-07-18 NOTE — Telephone Encounter (Signed)
LM for pt that we would like to schedule an office visit for her with Dr. Hilarie Fredrickson - Virtual.  Last seen 03-2017

## 2018-07-27 ENCOUNTER — Telehealth: Payer: Self-pay | Admitting: Internal Medicine

## 2018-07-27 MED ORDER — PANTOPRAZOLE SODIUM 40 MG PO TBEC: 40.0000 mg | DELAYED_RELEASE_TABLET | Freq: Every day | ORAL | 0 refills | Status: DC

## 2018-07-27 NOTE — Telephone Encounter (Signed)
Rx sent 

## 2018-08-19 ENCOUNTER — Other Ambulatory Visit: Payer: Self-pay | Admitting: Internal Medicine

## 2018-08-22 ENCOUNTER — Other Ambulatory Visit: Payer: Self-pay

## 2018-08-22 DIAGNOSIS — Z20822 Contact with and (suspected) exposure to covid-19: Secondary | ICD-10-CM

## 2018-08-23 LAB — NOVEL CORONAVIRUS, NAA: SARS-CoV-2, NAA: NOT DETECTED

## 2018-09-08 ENCOUNTER — Encounter: Payer: Self-pay | Admitting: *Deleted

## 2018-09-11 ENCOUNTER — Other Ambulatory Visit: Payer: Self-pay | Admitting: Internal Medicine

## 2018-09-12 ENCOUNTER — Ambulatory Visit: Payer: BLUE CROSS/BLUE SHIELD | Admitting: Internal Medicine

## 2018-09-14 ENCOUNTER — Telehealth: Payer: Self-pay | Admitting: Internal Medicine

## 2018-09-14 ENCOUNTER — Encounter: Payer: Self-pay | Admitting: *Deleted

## 2018-09-14 NOTE — Telephone Encounter (Signed)
Patient Abstract completed.

## 2018-09-15 ENCOUNTER — Ambulatory Visit (INDEPENDENT_AMBULATORY_CARE_PROVIDER_SITE_OTHER): Payer: BC Managed Care – PPO | Admitting: Internal Medicine

## 2018-09-15 DIAGNOSIS — K219 Gastro-esophageal reflux disease without esophagitis: Secondary | ICD-10-CM

## 2018-09-15 DIAGNOSIS — K5909 Other constipation: Secondary | ICD-10-CM | POA: Diagnosis not present

## 2018-09-15 MED ORDER — PANTOPRAZOLE SODIUM 40 MG PO TBEC: 40.0000 mg | DELAYED_RELEASE_TABLET | Freq: Every day | ORAL | 10 refills | Status: DC

## 2018-09-15 MED ORDER — LINACLOTIDE 72 MCG PO CAPS: 72.0000 ug | ORAL_CAPSULE | Freq: Every day | ORAL | 2 refills | Status: DC

## 2018-09-15 NOTE — Patient Instructions (Signed)
We have sent the following medications to your pharmacy for you to pick up at your convenience: Linzess 72 mcg once daily Pantoprazole 40 mg once daily  If you are age 41 or older, your body mass index should be between 23-30. Your There is no height or weight on file to calculate BMI. If this is out of the aforementioned range listed, please consider follow up with your Primary Care Provider.  If you are age 78 or younger, your body mass index should be between 19-25. Your There is no height or weight on file to calculate BMI. If this is out of the aformentioned range listed, please consider follow up with your Primary Care Provider.

## 2018-09-15 NOTE — Progress Notes (Signed)
   Subjective:    Patient ID: Julia [name], female    DOB: [birth year], 43 y.o.   MRN: [id number]  This service was provided via telemedicine.   D[employer]im[employer] app with A/V communication.  Due to lagging video, changed to telephone in the middle of the visit The patient was located at home The provider was located in provider's GI office. The patient did consent to this telephone visit and is aware of possible charges through their insurance for this visit.   The persons participating in this telemedicine service were the patient and I. Time spent on call: 16 minutes   HPI Julia [name] is a 43 year old female with a past medical history of GERD, chronic constipation, history of anal fissure who is seen for follow-up.  She is seen virtually today through the D[employer]imity application in the setting of the COVID-19 pandemic.  She was last seen in the office in March 2019 by just because they are, PA-C.  She has been maintained on pantoprazole 40 mg daily for GERD.  She reports that this medication has been life changing and extremely beneficial for her.  If she goes without this medication she has recurrent heartburn and globus sensation.  Also when her reflux is most significant she has decreased appetite.  On pantoprazole no dysphagia, odynophagia or epigastric abdominal pain.  Globus also improved on PPI.  Regarding her constipation she is using MiraLAX 17 g a day.  Despite this she can go 7 to 10 days without bowel movement.  After about 5 days without bowel movement she feels abdominal heaviness and bloating and sluggishness.  She reports that when she does have a bowel movement she will have a large bowel movement several times on that day and then maybe have another bowel movement the next day and then the cycle restarts.  No blood in her stool or melena.  No further anal fissure symptom.   Review of Systems As per HPI, otherwise negative  Current Medications, Allergies, Past Medical History, Past  Surgical History, Family History and Social History were reviewed in [employer] record.     Objective:   Physical Exam No physical exam today, virtual visit      Assessment & Plan:  43 year old female with a past medical history of GERD, chronic constipation, history of anal fissure who is seen for follow-up.   1.  GERD and globus --well-controlled on pantoprazole 40 mg daily.  We discussed the risk, benefits and alternatives to PPI therapy.  After this discussion she is comfortable continuing this medication, as am I. --Refill pantoprazole 40 mg daily; number 90 x 1 year  2.  Chronic idiopathic constipation --we discussed her symptoms and how she remains constipated despite daily MiraLAX.  I would consider this a MiraLAX failure.  We discussed options including daily laxatives.  I have recommended we try Linzess starting at 72 mcg daily.  We may have to dose titrate this for response.  We discussed the side effect of diarrhea and should this occur she is asked to let me know. --Begin Linzess 72 mcg daily  She can follow-up annually but sooner if her constipation is not under control

## 2018-09-16 ENCOUNTER — Encounter: Payer: Self-pay | Admitting: *Deleted

## 2018-09-16 DIAGNOSIS — F902 Attention-deficit hyperactivity disorder, combined type: Secondary | ICD-10-CM | POA: Diagnosis not present

## 2018-09-20 ENCOUNTER — Telehealth: Payer: Self-pay | Admitting: *Deleted

## 2018-09-20 NOTE — Telephone Encounter (Signed)
Ok, please explain to her why we are making this change Try Trulance 3 mg daily

## 2018-09-20 NOTE — Telephone Encounter (Signed)
Patient's Linzess has been denied by   [facility] (Reference #[id number]). They require that she try Trulance first.  Dr Hilarie Fredrickson, please advise.Marland KitchenMarland Kitchen

## 2018-09-21 NOTE — Telephone Encounter (Signed)
Left message for patient to call back  

## 2018-09-22 NOTE — Telephone Encounter (Signed)
I have left a message for patient to call back. 

## 2018-09-23 MED ORDER — TRULANCE 3 MG PO TABS: 1.0000 | ORAL_TABLET | Freq: Every day | ORAL | 3 refills | Status: DC

## 2018-09-23 NOTE — Telephone Encounter (Signed)
Rx sent for Trulance. Explained reasoning to patient. She verbalizes understanding.

## 2018-09-26 NOTE — Telephone Encounter (Signed)
Gurpreet Steffensmeier Key: [id number]  Outcome Approvedtoday Effective from 09/26/2018 through 09/25/2019. DrugTrulance 3MG  tablets

## 2018-10-24 DIAGNOSIS — F4320 Adjustment disorder, unspecified: Secondary | ICD-10-CM | POA: Diagnosis not present

## 2018-11-09 ENCOUNTER — Other Ambulatory Visit: Payer: Self-pay

## 2018-11-09 DIAGNOSIS — Z20828 Contact with and (suspected) exposure to other viral communicable diseases: Secondary | ICD-10-CM | POA: Diagnosis not present

## 2018-11-09 DIAGNOSIS — Z20822 Contact with and (suspected) exposure to covid-19: Secondary | ICD-10-CM

## 2018-11-10 LAB — NOVEL CORONAVIRUS, NAA: SARS-CoV-2, NAA: NOT DETECTED

## 2018-11-23 ENCOUNTER — Other Ambulatory Visit: Payer: Self-pay

## 2018-11-23 DIAGNOSIS — Z20828 Contact with and (suspected) exposure to other viral communicable diseases: Secondary | ICD-10-CM | POA: Diagnosis not present

## 2018-11-23 DIAGNOSIS — Z20822 Contact with and (suspected) exposure to covid-19: Secondary | ICD-10-CM

## 2018-11-24 LAB — NOVEL CORONAVIRUS, NAA: SARS-CoV-2, NAA: NOT DETECTED

## 2018-12-12 DIAGNOSIS — Z03818 Encounter for observation for suspected exposure to other biological agents ruled out: Secondary | ICD-10-CM | POA: Diagnosis not present

## 2018-12-13 DIAGNOSIS — Z20828 Contact with and (suspected) exposure to other viral communicable diseases: Secondary | ICD-10-CM | POA: Diagnosis not present

## 2019-02-06 ENCOUNTER — Ambulatory Visit: Payer: BC Managed Care – PPO | Attending: Internal Medicine

## 2019-02-06 DIAGNOSIS — Z20822 Contact with and (suspected) exposure to covid-19: Secondary | ICD-10-CM

## 2019-02-07 LAB — NOVEL CORONAVIRUS, NAA: SARS-CoV-2, NAA: NOT DETECTED

## 2019-02-08 DIAGNOSIS — Z1152 Encounter for screening for COVID-19: Secondary | ICD-10-CM | POA: Diagnosis not present

## 2019-02-08 DIAGNOSIS — R52 Pain, unspecified: Secondary | ICD-10-CM | POA: Diagnosis not present

## 2019-02-08 DIAGNOSIS — R509 Fever, unspecified: Secondary | ICD-10-CM | POA: Diagnosis not present

## 2019-02-08 DIAGNOSIS — R112 Nausea with vomiting, unspecified: Secondary | ICD-10-CM | POA: Diagnosis not present

## 2019-03-03 DIAGNOSIS — F331 Major depressive disorder, recurrent, moderate: Secondary | ICD-10-CM | POA: Diagnosis not present

## 2019-03-09 DIAGNOSIS — F331 Major depressive disorder, recurrent, moderate: Secondary | ICD-10-CM | POA: Diagnosis not present

## 2019-03-17 DIAGNOSIS — F331 Major depressive disorder, recurrent, moderate: Secondary | ICD-10-CM | POA: Diagnosis not present

## 2019-03-23 DIAGNOSIS — F411 Generalized anxiety disorder: Secondary | ICD-10-CM | POA: Diagnosis not present

## 2019-04-04 DIAGNOSIS — F411 Generalized anxiety disorder: Secondary | ICD-10-CM | POA: Diagnosis not present

## 2019-04-16 ENCOUNTER — Other Ambulatory Visit: Payer: Self-pay | Admitting: Internal Medicine

## 2019-04-17 DIAGNOSIS — F419 Anxiety disorder, unspecified: Secondary | ICD-10-CM | POA: Diagnosis not present

## 2019-04-17 DIAGNOSIS — K219 Gastro-esophageal reflux disease without esophagitis: Secondary | ICD-10-CM | POA: Diagnosis not present

## 2019-04-17 DIAGNOSIS — D649 Anemia, unspecified: Secondary | ICD-10-CM | POA: Diagnosis not present

## 2019-04-17 DIAGNOSIS — K5904 Chronic idiopathic constipation: Secondary | ICD-10-CM | POA: Diagnosis not present

## 2019-05-09 DIAGNOSIS — F411 Generalized anxiety disorder: Secondary | ICD-10-CM | POA: Diagnosis not present

## 2019-05-11 DIAGNOSIS — Z79899 Other long term (current) drug therapy: Secondary | ICD-10-CM | POA: Diagnosis not present

## 2019-05-11 DIAGNOSIS — R7301 Impaired fasting glucose: Secondary | ICD-10-CM | POA: Diagnosis not present

## 2019-05-11 DIAGNOSIS — Z0000 Encounter for general adult medical examination without abnormal findings: Secondary | ICD-10-CM | POA: Diagnosis not present

## 2019-05-18 DIAGNOSIS — R7301 Impaired fasting glucose: Secondary | ICD-10-CM | POA: Diagnosis not present

## 2019-05-18 DIAGNOSIS — D649 Anemia, unspecified: Secondary | ICD-10-CM | POA: Diagnosis not present

## 2019-05-18 DIAGNOSIS — Z0000 Encounter for general adult medical examination without abnormal findings: Secondary | ICD-10-CM | POA: Diagnosis not present

## 2019-05-18 DIAGNOSIS — Z1331 Encounter for screening for depression: Secondary | ICD-10-CM | POA: Diagnosis not present

## 2019-05-18 DIAGNOSIS — K5909 Other constipation: Secondary | ICD-10-CM | POA: Diagnosis not present

## 2019-05-18 DIAGNOSIS — K219 Gastro-esophageal reflux disease without esophagitis: Secondary | ICD-10-CM | POA: Diagnosis not present

## 2019-06-15 DIAGNOSIS — F411 Generalized anxiety disorder: Secondary | ICD-10-CM | POA: Diagnosis not present

## 2019-06-21 DIAGNOSIS — Z1231 Encounter for screening mammogram for malignant neoplasm of breast: Secondary | ICD-10-CM | POA: Diagnosis not present

## 2019-06-21 DIAGNOSIS — F411 Generalized anxiety disorder: Secondary | ICD-10-CM | POA: Diagnosis not present

## 2019-06-21 DIAGNOSIS — Z01419 Encounter for gynecological examination (general) (routine) without abnormal findings: Secondary | ICD-10-CM | POA: Diagnosis not present

## 2019-06-21 DIAGNOSIS — Z6827 Body mass index (BMI) 27.0-27.9, adult: Secondary | ICD-10-CM | POA: Diagnosis not present

## 2019-06-23 DIAGNOSIS — Z1231 Encounter for screening mammogram for malignant neoplasm of breast: Secondary | ICD-10-CM | POA: Diagnosis not present

## 2019-06-23 DIAGNOSIS — Z6827 Body mass index (BMI) 27.0-27.9, adult: Secondary | ICD-10-CM | POA: Diagnosis not present

## 2019-06-23 DIAGNOSIS — Z01419 Encounter for gynecological examination (general) (routine) without abnormal findings: Secondary | ICD-10-CM | POA: Diagnosis not present

## 2019-06-28 ENCOUNTER — Other Ambulatory Visit: Payer: Self-pay | Admitting: Obstetrics and Gynecology

## 2019-06-28 ENCOUNTER — Ambulatory Visit
Admission: RE | Admit: 2019-06-28 | Discharge: 2019-06-28 | Disposition: A | Discharge status: Home / Self Care | Payer: BC Managed Care – PPO | Source: Ambulatory Visit | Attending: Obstetrics and Gynecology | Admitting: Obstetrics and Gynecology

## 2019-06-28 ENCOUNTER — Other Ambulatory Visit: Payer: Self-pay

## 2019-06-28 DIAGNOSIS — R921 Mammographic calcification found on diagnostic imaging of breast: Secondary | ICD-10-CM

## 2019-06-28 DIAGNOSIS — R922 Inconclusive mammogram: Secondary | ICD-10-CM | POA: Diagnosis not present

## 2019-06-28 IMAGING — MG DIGITAL DIAGNOSTIC UNILAT RIGHT W/ CAD
4 series · 4 of 4 positions shown · non-contrast
Comparison: Baseline screening mammogram dated [DATE].

CLINICAL DATA: Patient was called back from screening mammogram for
right breast calcifications.

EXAM:
DIGITAL DIAGNOSTIC RIGHT MAMMOGRAM WITH CAD

[R CC]
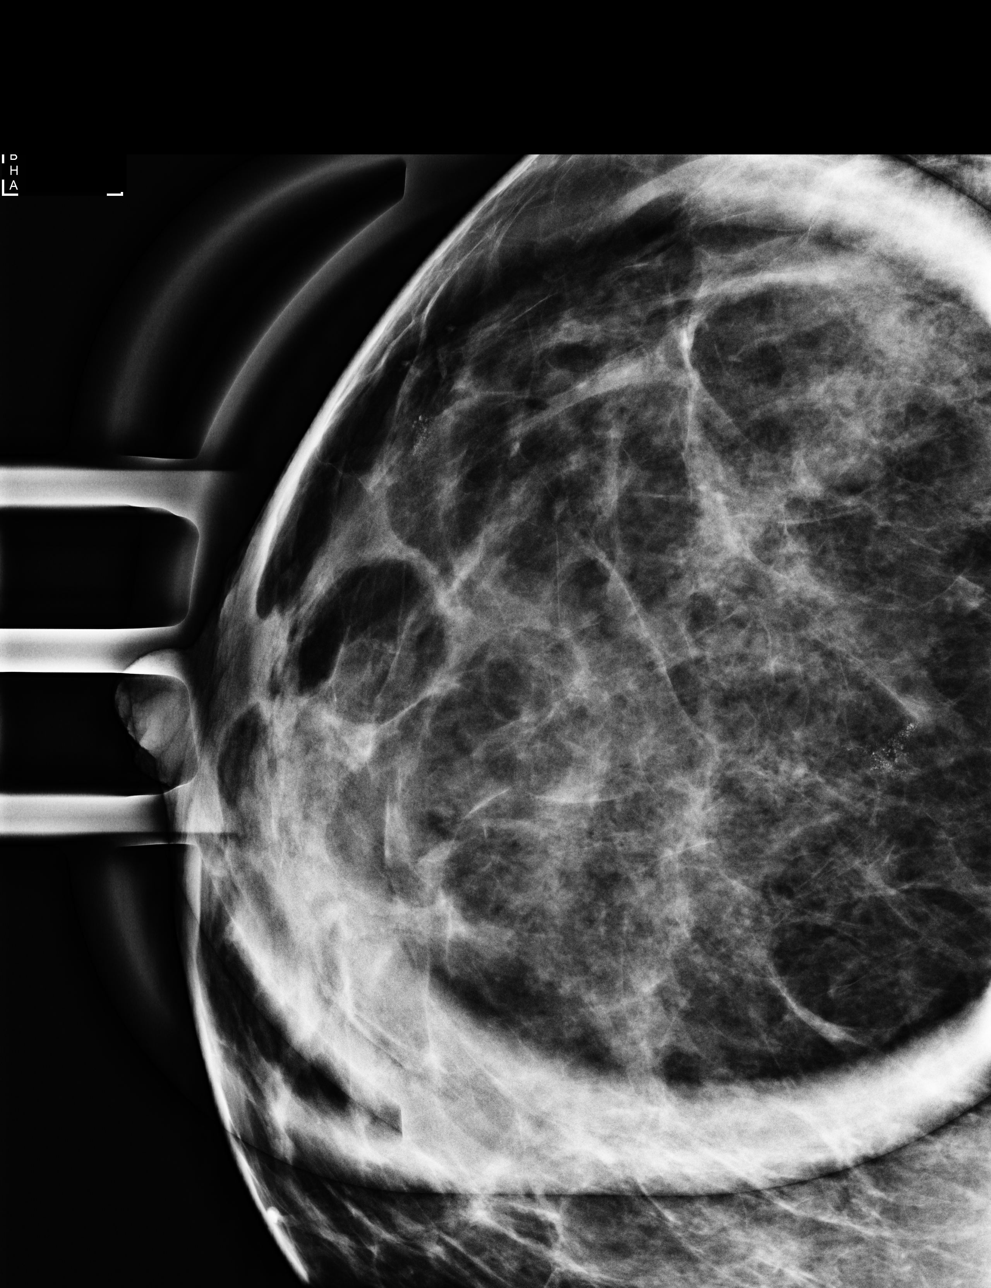

[R ML (1 of 3)]
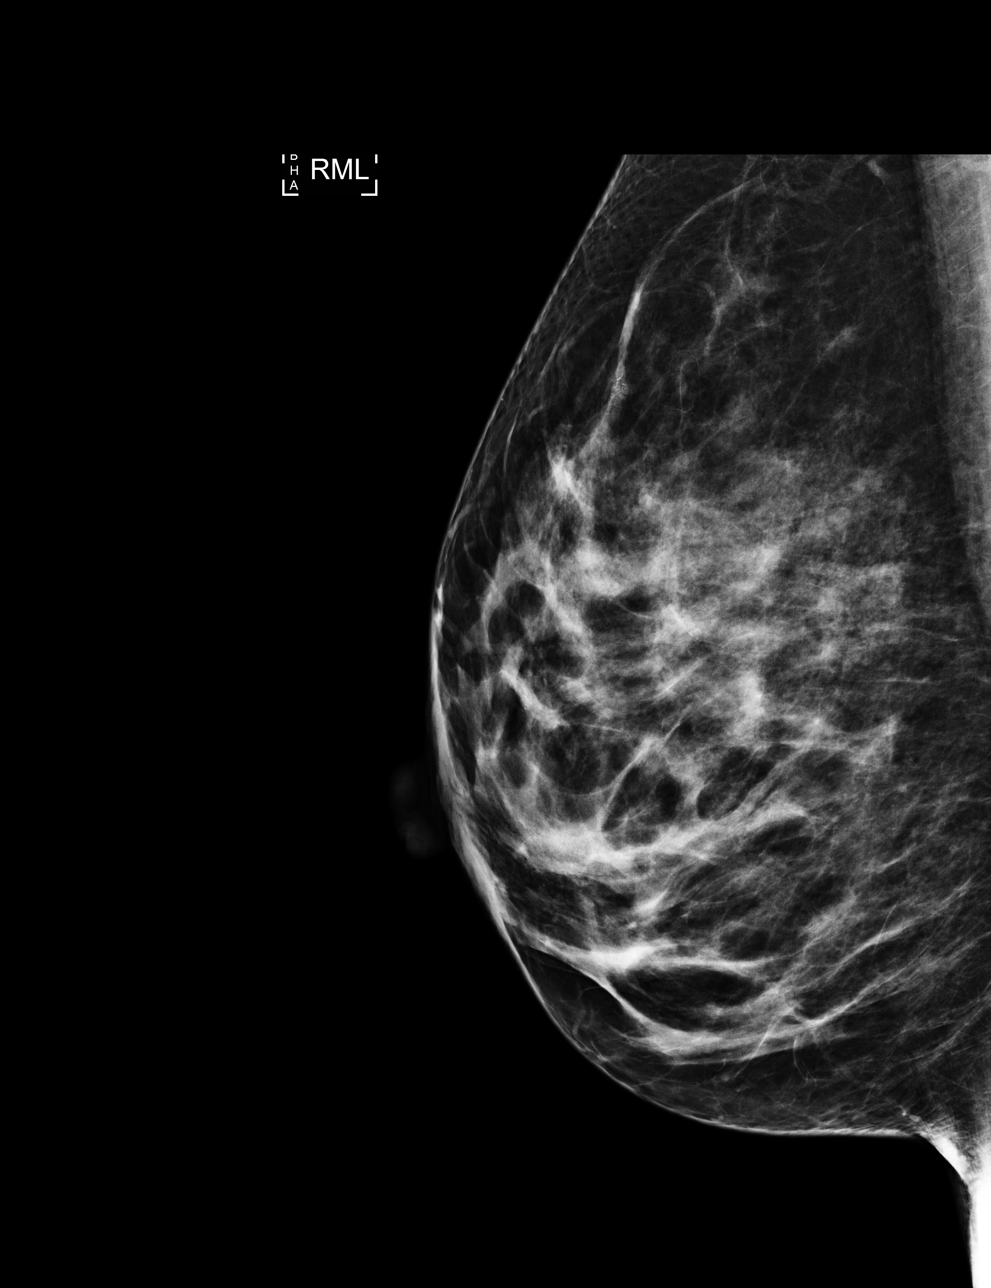

[R ML (2 of 3)]
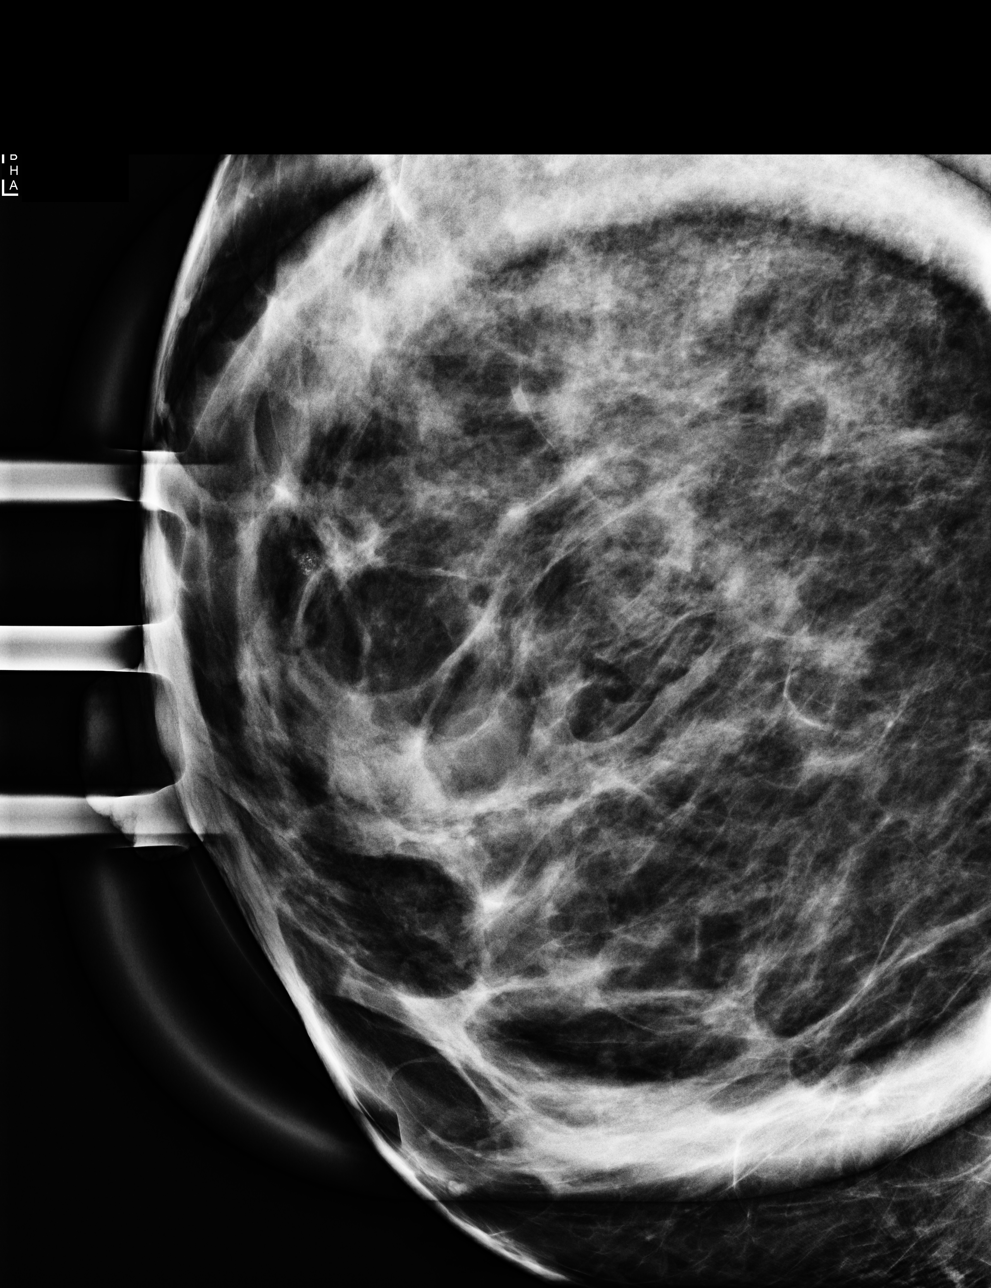

[R ML (3 of 3)]
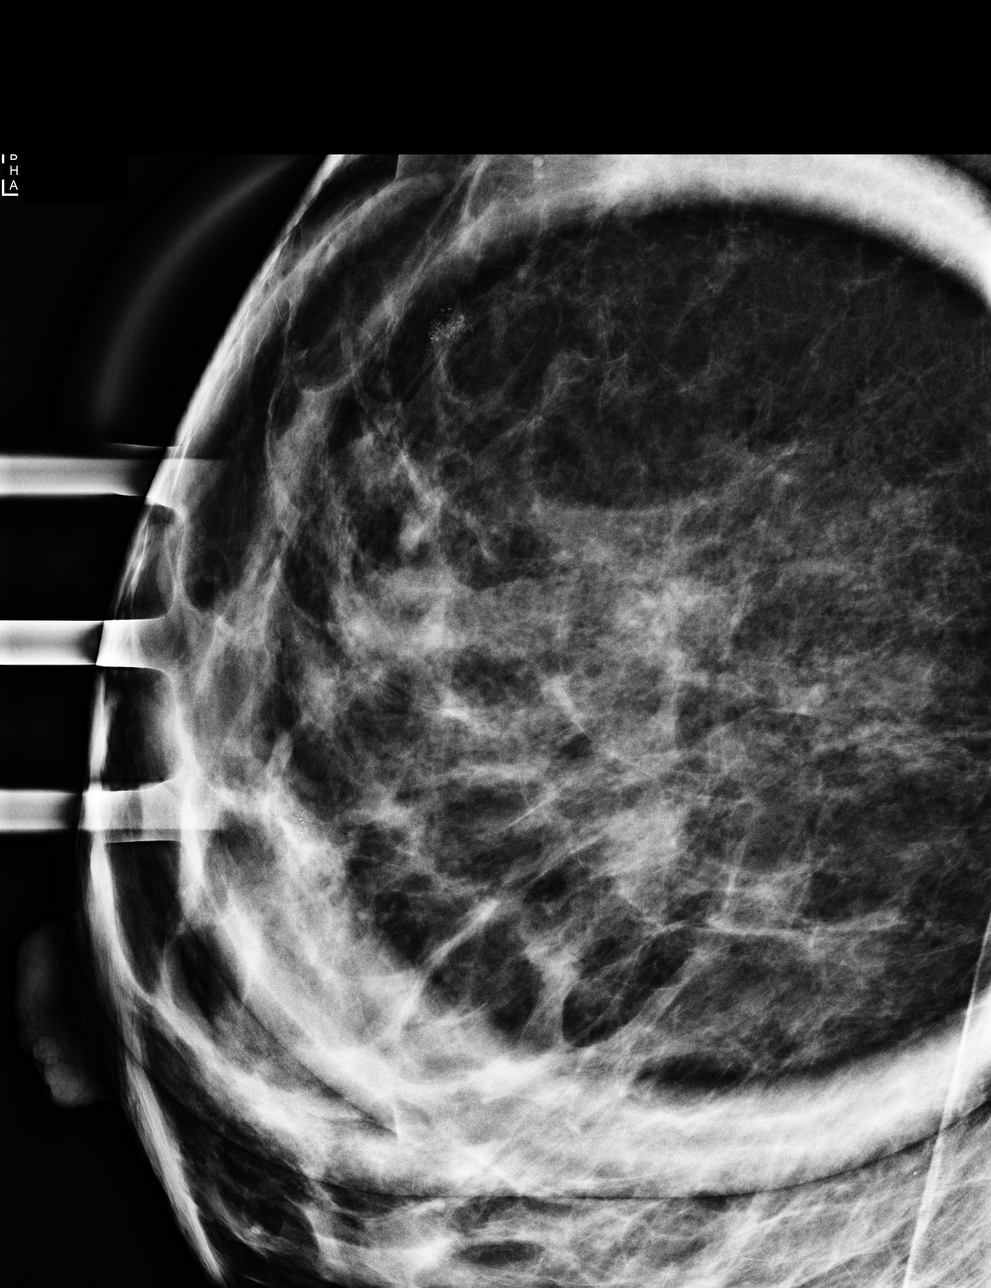

[4 of 4 positions shown; findings below may reference images not displayed]

ACR Breast Density Category c: The breast tissue is heterogeneously
dense, which may obscure small masses.
FINDINGS: Additional images of the right breast were performed. There are
grouped coarse heterogeneous calcifications in the upper slightly
outer quadrant of the right breast spanning an area of 7 mm. There
is a second group of coarse heterogeneous calcifications spanning 4
mm in the upper-outer quadrant of the right breast.

Mammographic images were processed with CAD.
IMPRESSION: Two areas of indeterminate calcifications in the upper-outer
quadrant of the right breast.

RECOMMENDATION:
Stereotactic biopsy of the 2 groups of calcifications in the
upper-outer quadrant of the right breast is recommended. The
biopsies will be scheduled at the patient's convenience.

I have discussed the findings and recommendations with the patient.
If applicable, a reminder letter will be sent to the patient
regarding the next appointment.

BI-RADS CATEGORY  4: Suspicious.

## 2019-06-30 ENCOUNTER — Other Ambulatory Visit: Payer: Self-pay | Admitting: Internal Medicine

## 2019-07-06 ENCOUNTER — Ambulatory Visit
Admission: RE | Admit: 2019-07-06 | Discharge: 2019-07-06 | Disposition: A | Discharge status: Home / Self Care | Payer: BC Managed Care – PPO | Source: Ambulatory Visit | Attending: Obstetrics and Gynecology | Admitting: Obstetrics and Gynecology

## 2019-07-06 ENCOUNTER — Other Ambulatory Visit: Payer: Self-pay

## 2019-07-06 DIAGNOSIS — N6011 Diffuse cystic mastopathy of right breast: Secondary | ICD-10-CM | POA: Diagnosis not present

## 2019-07-06 DIAGNOSIS — R921 Mammographic calcification found on diagnostic imaging of breast: Secondary | ICD-10-CM | POA: Diagnosis not present

## 2019-07-06 DIAGNOSIS — N6489 Other specified disorders of breast: Secondary | ICD-10-CM | POA: Diagnosis not present

## 2019-07-06 IMAGING — MG MM BREAST LOCALIZATION CLIP
4 series · 4 of 12 positions shown · non-contrast
Comparison: Previous exam(s).

CLINICAL DATA: Patient status post stereotactic guided core needle
biopsy right breast, 2 sites.

EXAM:
DIAGNOSTIC RIGHT MAMMOGRAM POST STEREOTACTIC BIOPSY

[R CC synth-2D]
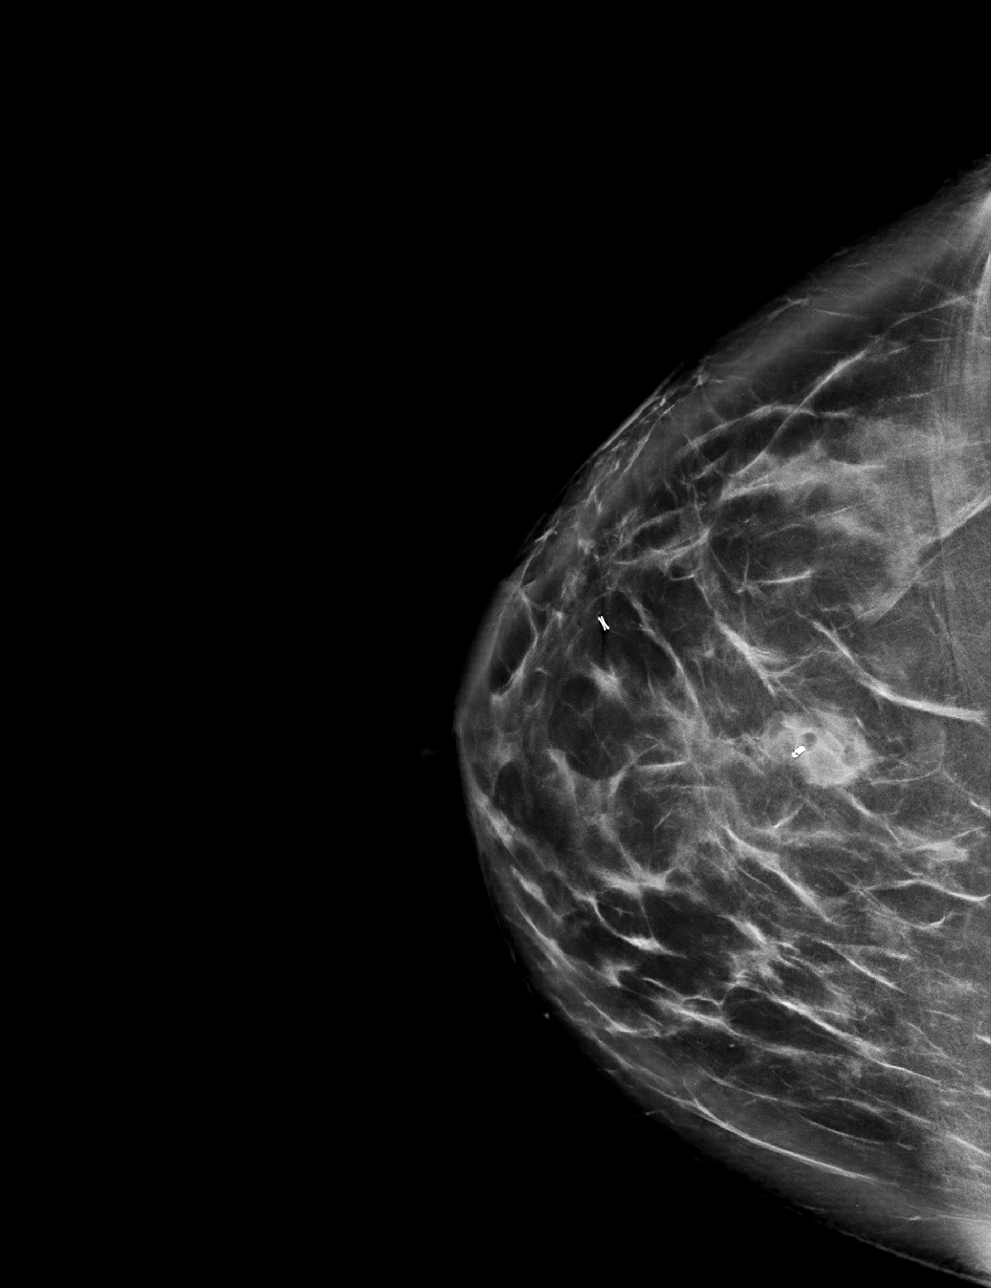

[R ML synth-2D]
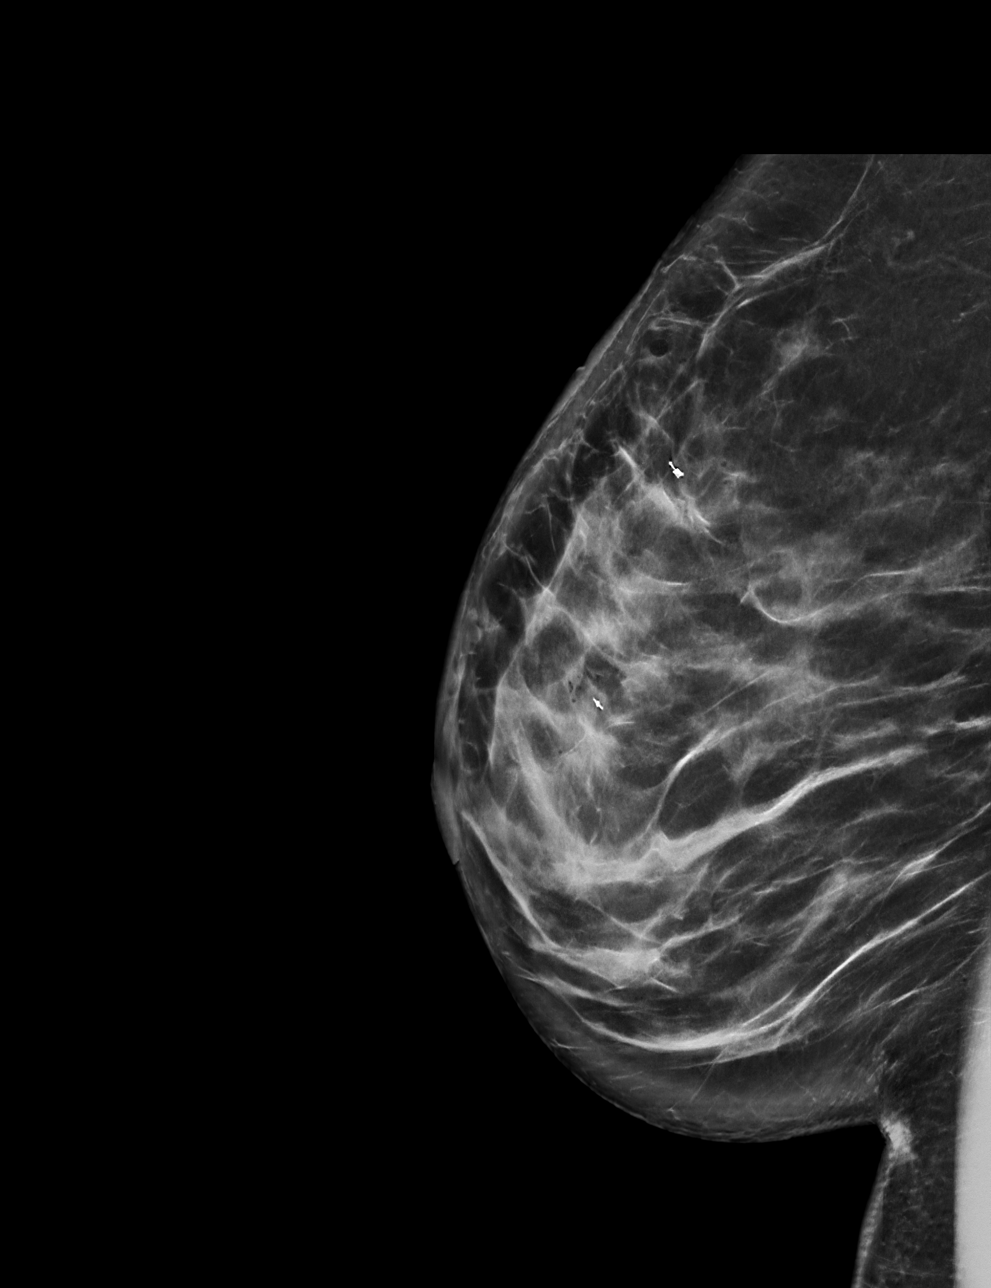

[R ML tomo · tomo slice 43/85.0]
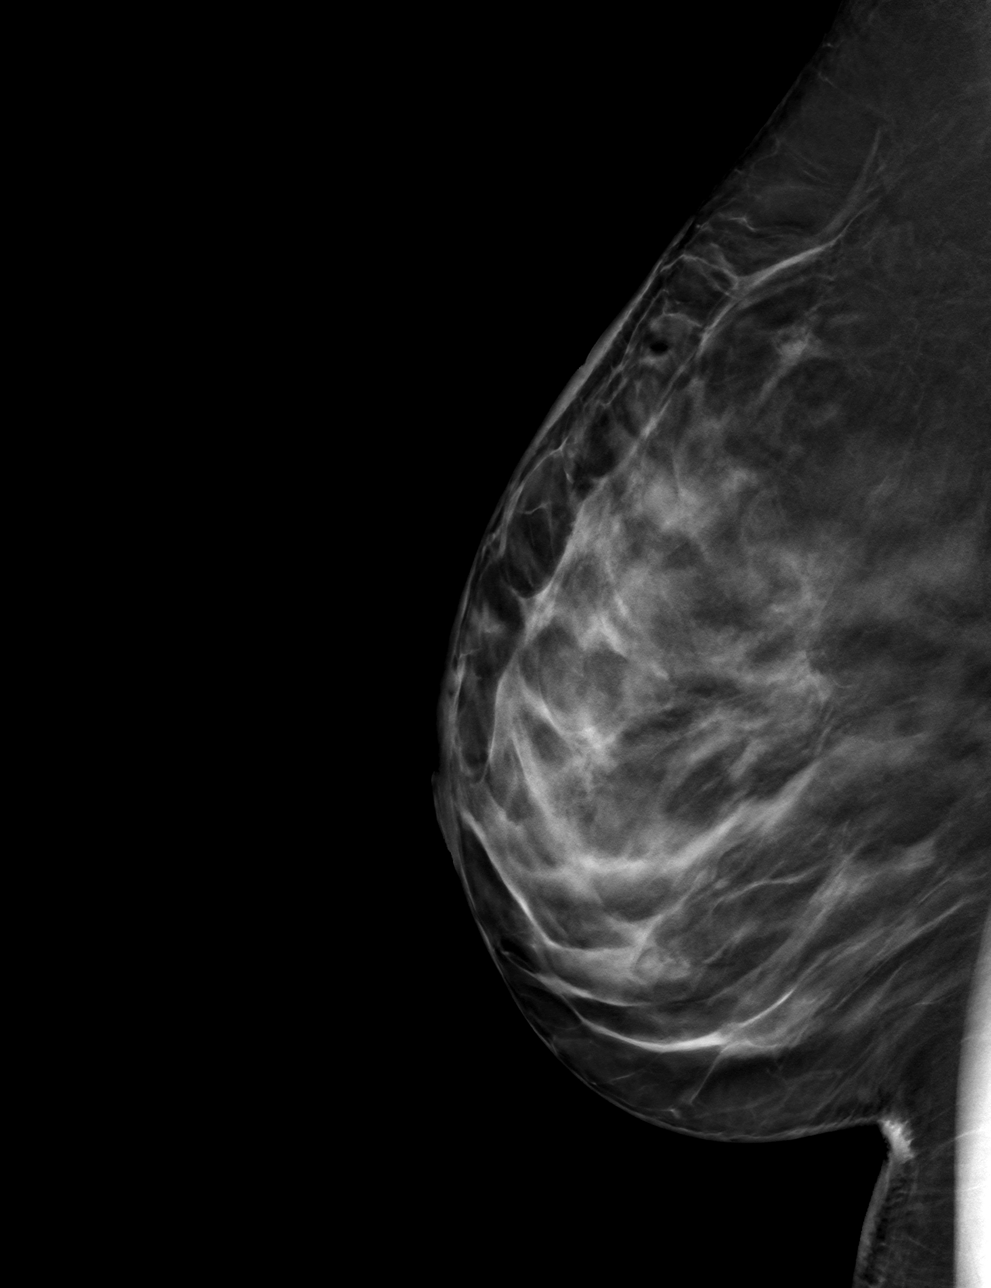

[R CC tomo · tomo slice 53/104.0]
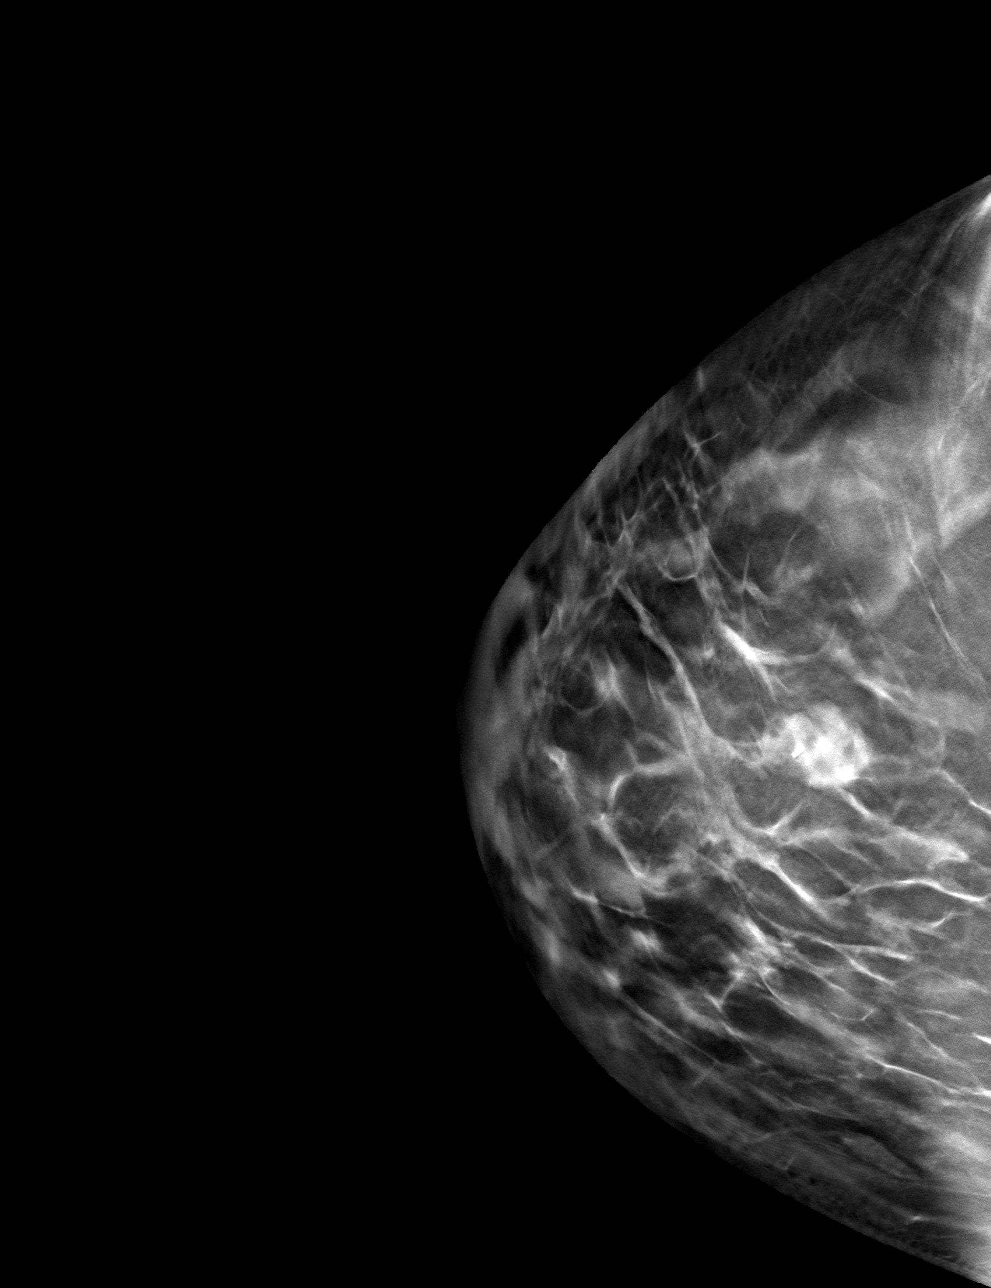

[4 of 12 positions shown; findings below may reference images not displayed]

FINDINGS: Site 1: Lateral periareolar: X shaped clip: In appropriate position.

Site 2: Upper-outer right breast middle depth: Coil shaped clip: In
appropriate position.
IMPRESSION: Appropriate position biopsy marking clips as above status post
stereotactic guided biopsy 2 sites right breast.

Final Assessment: Post Procedure Mammograms for Marker Placement

## 2019-07-06 IMAGING — MG MM BREAST BX W/ LOC DEV EA AD LESION IMAG BX SPEC STEREO GUIDE*R
7 of 9 series · 7 of 21 positions shown · non-contrast
Comparison: Previous exams.
COMPARISON: Previous exams.

Addendum:
CLINICAL DATA: Patient with indeterminate right breast
calcifications status post biopsy, 2 sites.

EXAM:
RIGHT BREAST STEREOTACTIC CORE NEEDLE BIOPSY

[R (1 of 6)]
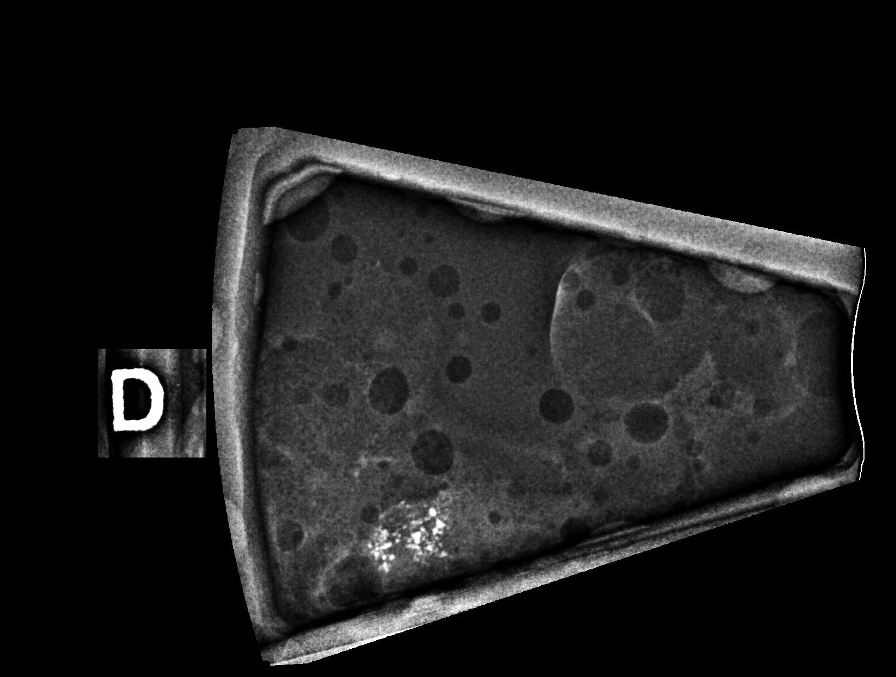

[R (2 of 6)]
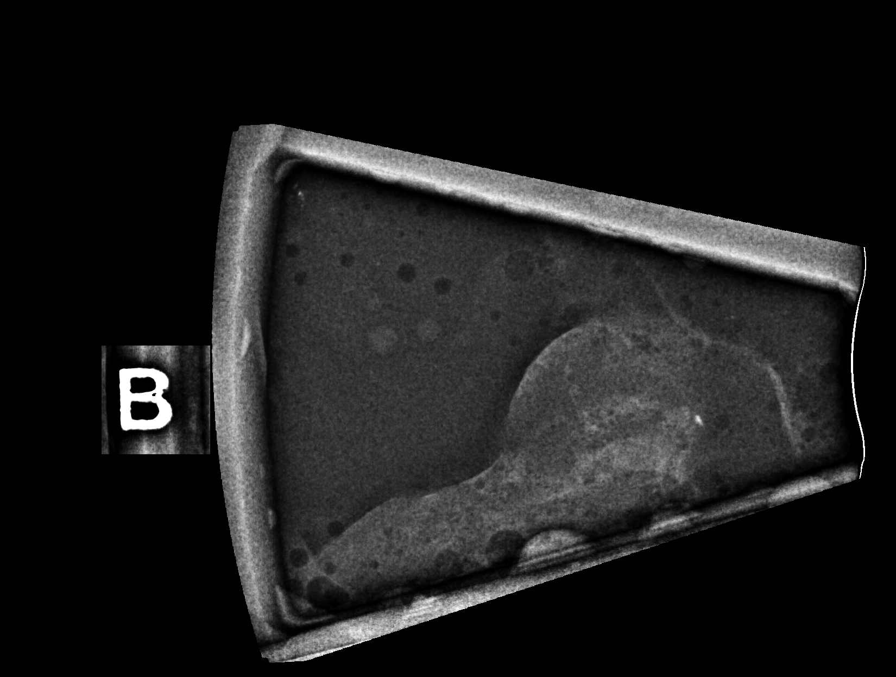

[R (3 of 6)]
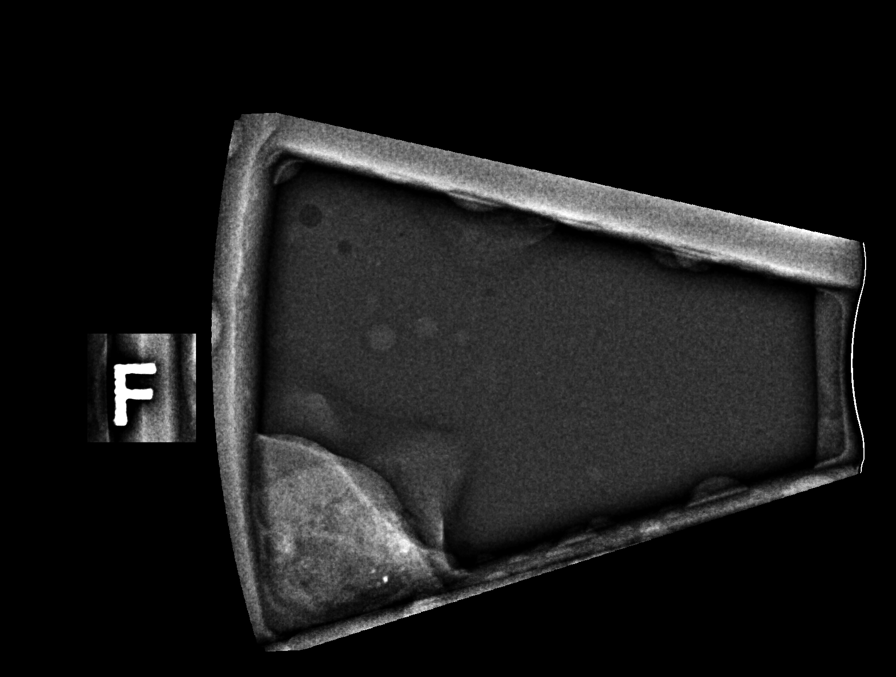

[R (4 of 6)]
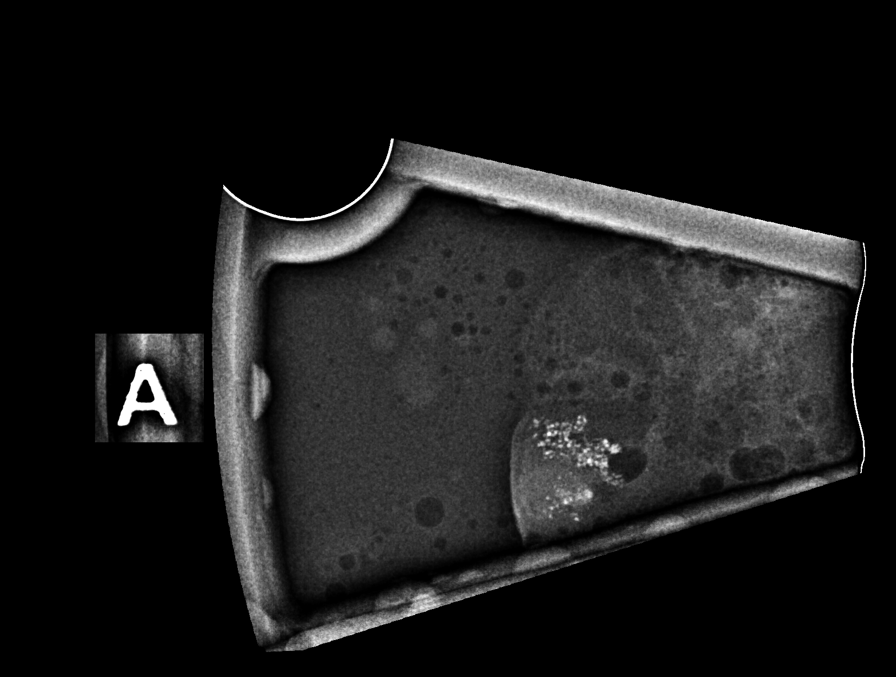

[R (5 of 6)]
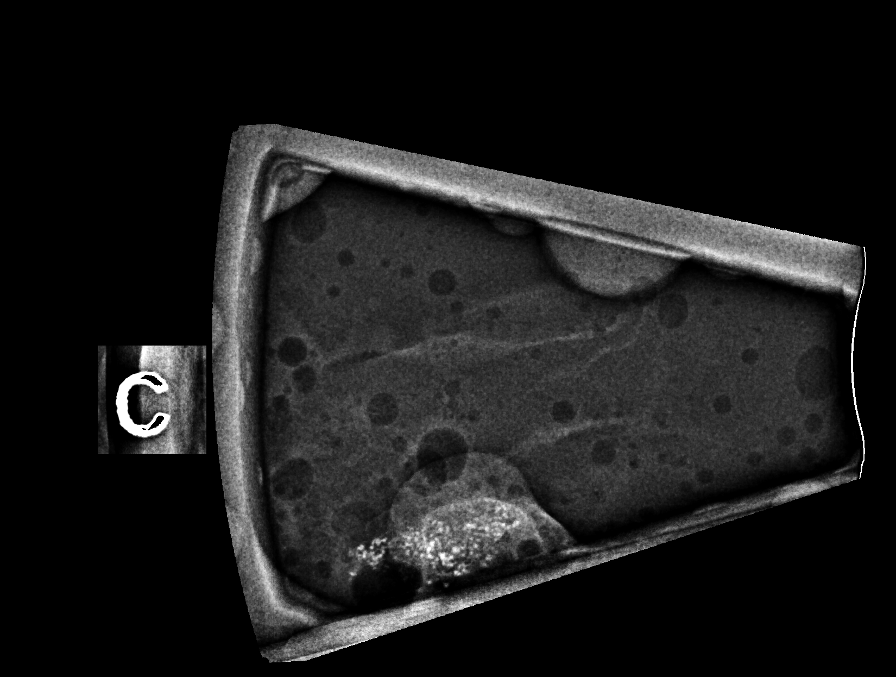

[R (6 of 6)]
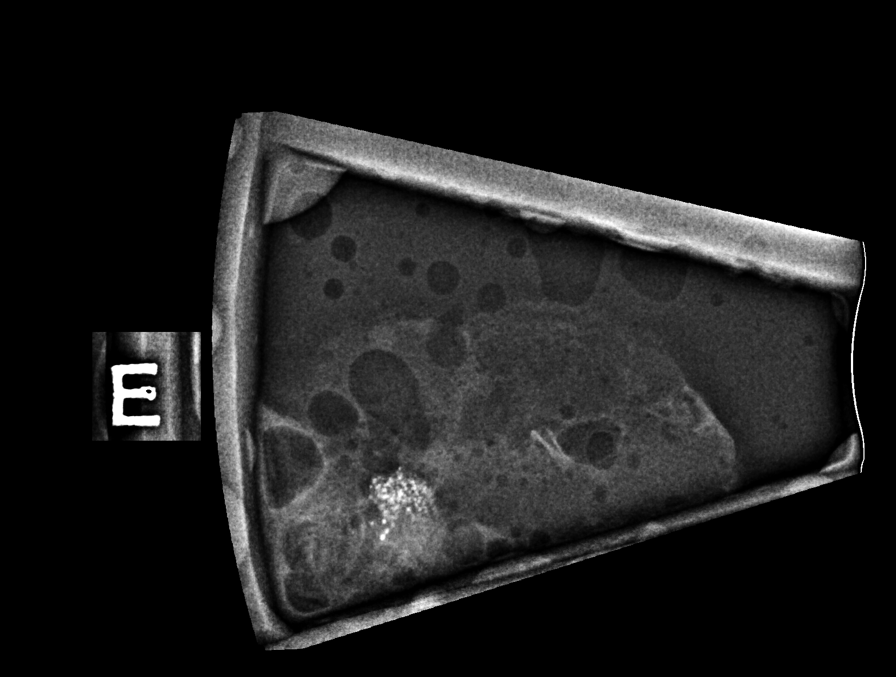

[R CC tomo · tomo slice 33/64.0]
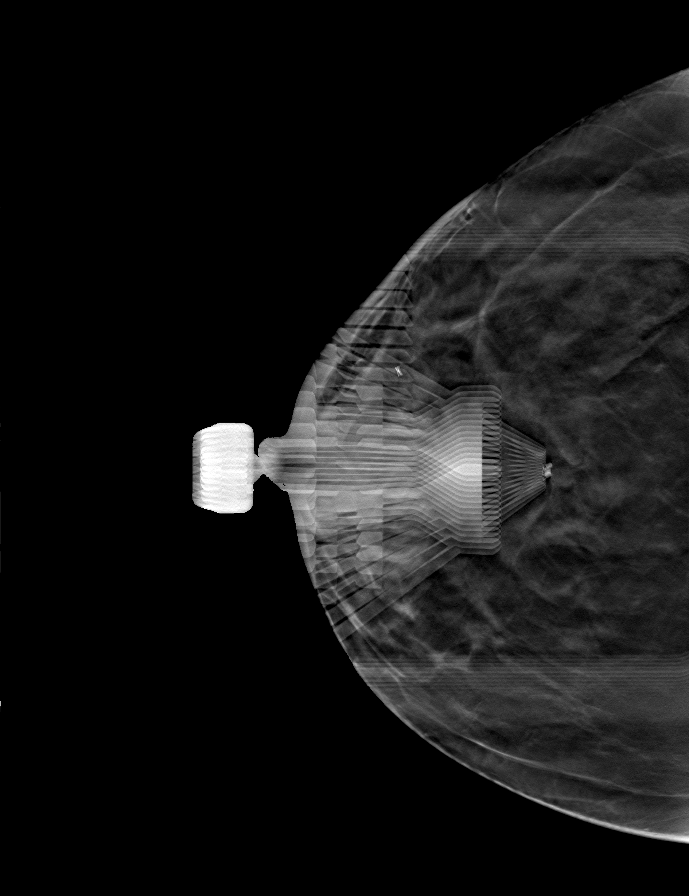

[7 of 21 positions shown; findings below may reference images not displayed]



Site 1 lateral periareolar: X clip

Using sterile technique and 1% Lidocaine as local anesthetic, under
stereotactic guidance, a 9 gauge vacuum assisted device was used to
perform core needle biopsy of calcifications within the lateral
periareolar right breast using a lateral approach. Specimen
radiograph was performed showing calcifications. Specimens with
calcifications are identified for pathology.

Lesion quadrant: Upper outer quadrant

At the conclusion of the procedure, X shaped tissue marker clip was
deployed into the biopsy cavity. Follow-up 2-view mammogram was
performed and dictated separately.

Site 2: Upper-outer middle depth: Coil clip

Using sterile technique and 1% Lidocaine as local anesthetic, under
stereotactic guidance, a 9 gauge vacuum assisted device was used to
perform core needle biopsy of calcifications within the superior
slightly lateral right breast middle depth using a cranial approach.
Specimen radiograph was performed showing calcifications. Specimens
with calcifications are identified for pathology.

Lesion quadrant: Upper outer quadrant

At the conclusion of the procedure, coil shaped tissue marker clip
was deployed into the biopsy cavity. Follow-up 2-view mammogram was
performed and dictated separately.
IMPRESSION: Stereotactic-guided biopsy of right breast calcifications, 2 sites.
No apparent complications.

ADDENDUM:
PATHOLOGY revealed:

1. Breast, right, needle core biopsy, lateral periareolar, x clip-
FIBROCYSTIC CHANGE WITH CALCIFICATIONS. PSEUDOANGIOMATOUS STROMAL
HYPERPLASIA.

2. Breast, right, needle core biopsy, upper outer middle depth, coil
clip- FLAT EPITHELIAL ATYPIA WITH CALCIFICATIONS.

Pathology results are CONCORDANT with imaging findings, per Dr. NOMASIBULELE
NOMASIBULELE.

Pathology results and recommendations below were discussed with
patient by telephone on [DATE]. Patient reported biopsy site doing
well with slight tenderness at the site. Post biopsy care
instructions were reviewed and questions were answered. Patient was
instructed to call [REDACTED] if any
concerns or questions arise related to the biopsy.

Recommendation: Surgical referral. Site #2 requires surgical
consultation; appointment arranged for patient to see Dr. NOMASIBULELE
NOMASIBULELE at [REDACTED] on [DATE] at 10:30 o'clock.

Addendum by NOMASIBULELE RN on [DATE].



Site 1 lateral periareolar: X clip

Using sterile technique and 1% Lidocaine as local anesthetic, under
stereotactic guidance, a 9 gauge vacuum assisted device was used to
perform core needle biopsy of calcifications within the lateral
periareolar right breast using a lateral approach. Specimen
radiograph was performed showing calcifications. Specimens with
calcifications are identified for pathology.

Lesion quadrant: Upper outer quadrant

At the conclusion of the procedure, X shaped tissue marker clip was
deployed into the biopsy cavity. Follow-up 2-view mammogram was
performed and dictated separately.

Site 2: Upper-outer middle depth: Coil clip

Using sterile technique and 1% Lidocaine as local anesthetic, under
stereotactic guidance, a 9 gauge vacuum assisted device was used to
perform core needle biopsy of calcifications within the superior
slightly lateral right breast middle depth using a cranial approach.
Specimen radiograph was performed showing calcifications. Specimens
with calcifications are identified for pathology.

Lesion quadrant: Upper outer quadrant

At the conclusion of the procedure, coil shaped tissue marker clip
was deployed into the biopsy cavity. Follow-up 2-view mammogram was
performed and dictated separately.
IMPRESSION: Stereotactic-guided biopsy of right breast calcifications, 2 sites.
No apparent complications.

## 2019-07-06 IMAGING — MG MM BREAST BX W/ LOC DEV 1ST LESION IMAGE BX SPEC STEREO GUIDE*R*
7 series · 7 of 23 positions shown · non-contrast
Comparison: Previous exams.
COMPARISON: Previous exams.

Addendum:
CLINICAL DATA: Patient with indeterminate right breast
calcifications status post biopsy, 2 sites.

EXAM:
RIGHT BREAST STEREOTACTIC CORE NEEDLE BIOPSY

[R (1 of 3)]
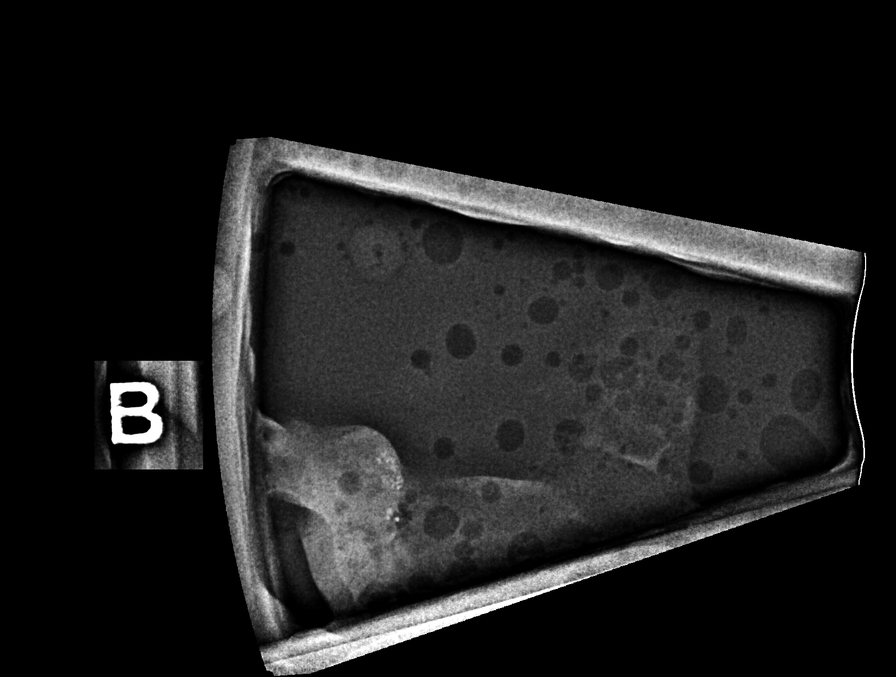

[R (2 of 3)]
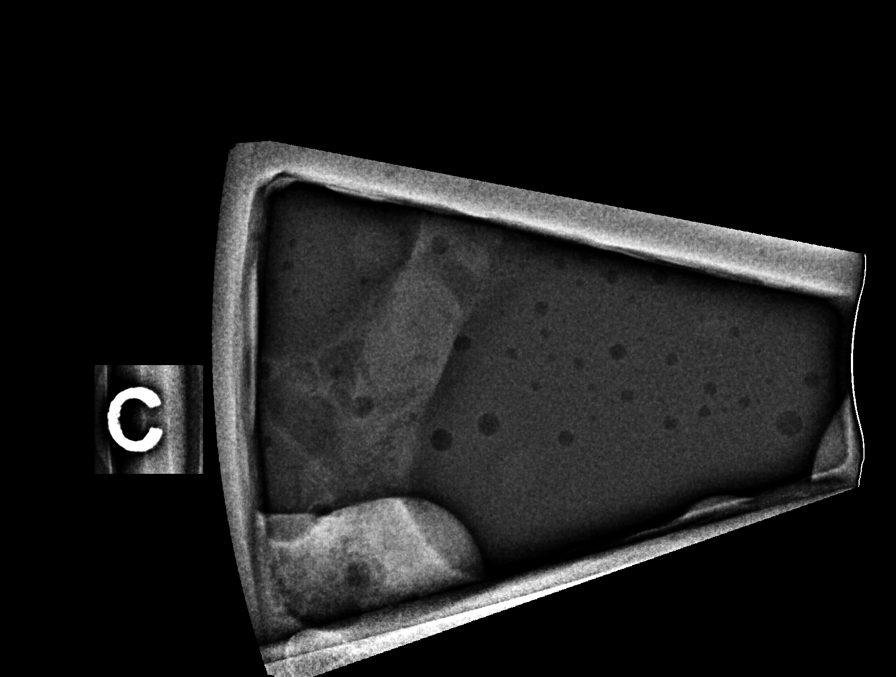

[R (3 of 3)]
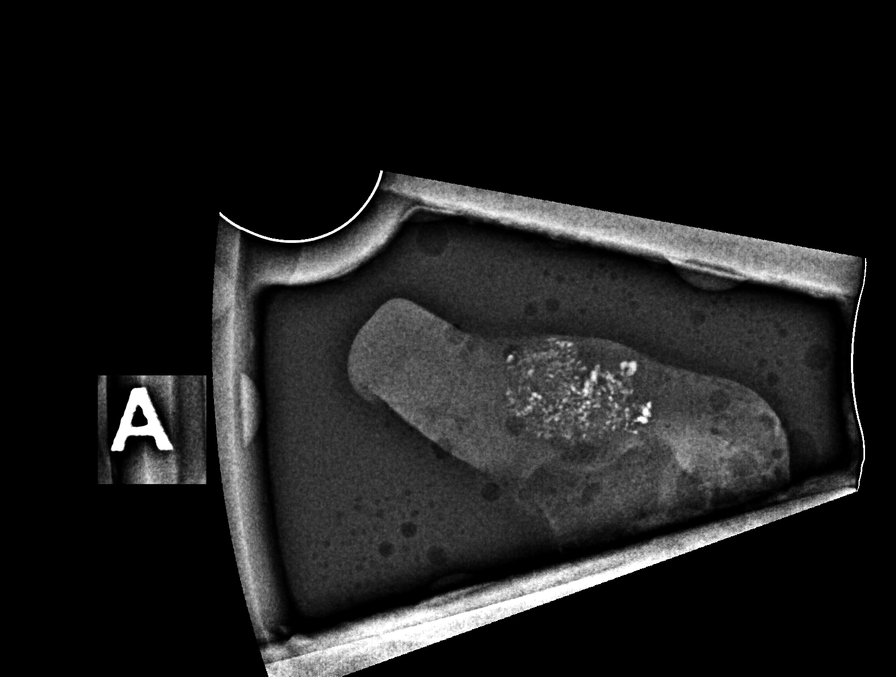

[R LM tomo (1 of 4) · tomo slice 26/51.0]
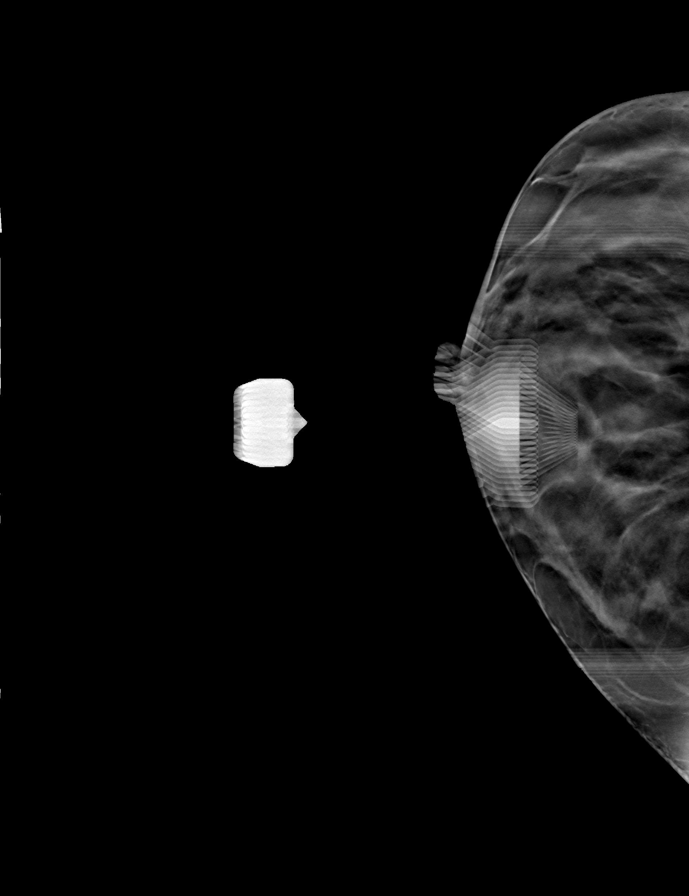

[R LM tomo (2 of 4) · tomo slice 29/56.0]
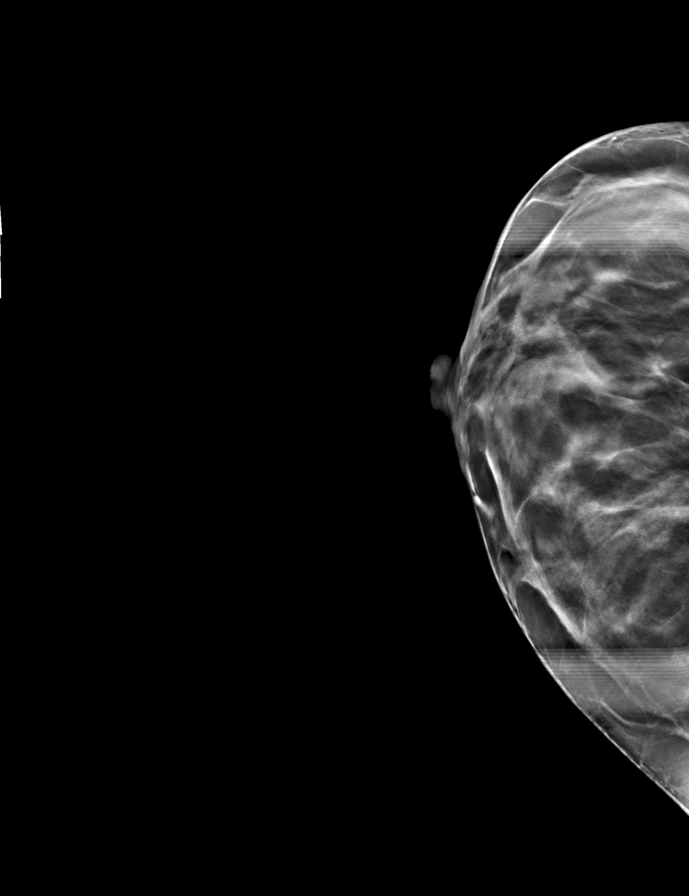

[R LM tomo (3 of 4) · tomo slice 27/52.0]
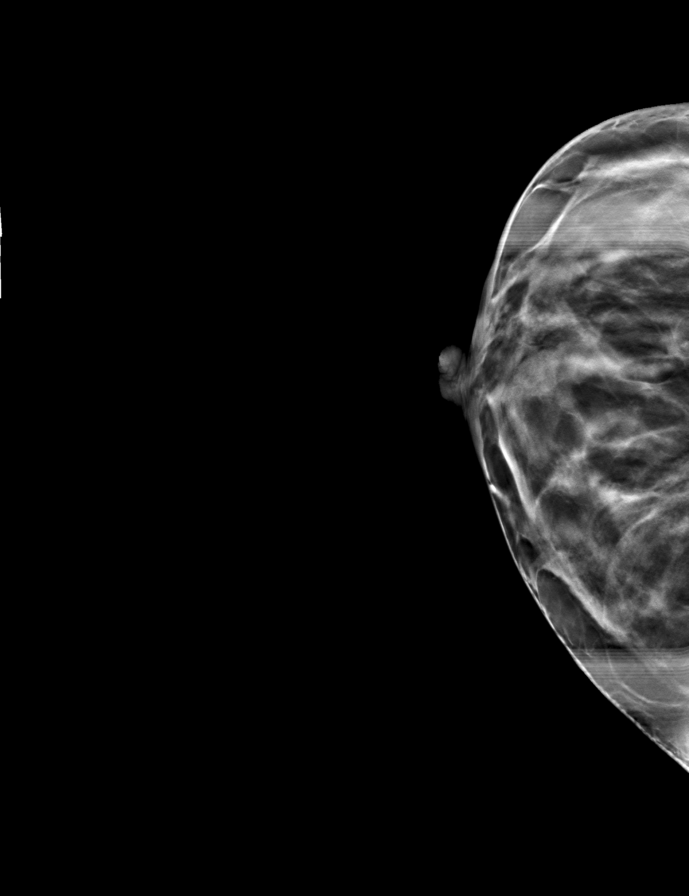

[R LM tomo (4 of 4) · tomo slice 26/51.0]
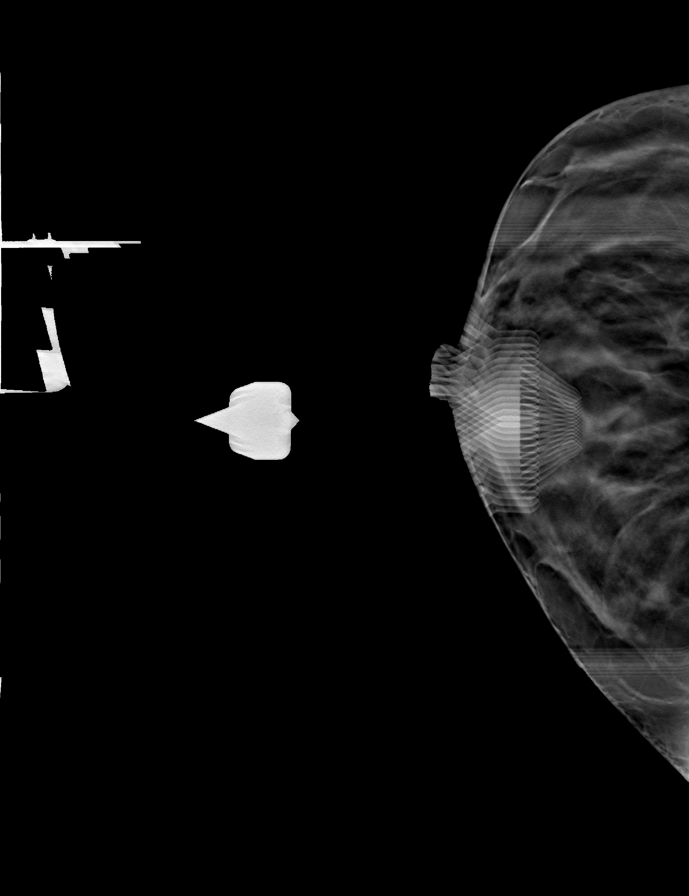

[7 of 23 positions shown; findings below may reference images not displayed]



Site 1 lateral periareolar: X clip

Using sterile technique and 1% Lidocaine as local anesthetic, under
stereotactic guidance, a 9 gauge vacuum assisted device was used to
perform core needle biopsy of calcifications within the lateral
periareolar right breast using a lateral approach. Specimen
radiograph was performed showing calcifications. Specimens with
calcifications are identified for pathology.

Lesion quadrant: Upper outer quadrant

At the conclusion of the procedure, X shaped tissue marker clip was
deployed into the biopsy cavity. Follow-up 2-view mammogram was
performed and dictated separately.

Site 2: Upper-outer middle depth: Coil clip

Using sterile technique and 1% Lidocaine as local anesthetic, under
stereotactic guidance, a 9 gauge vacuum assisted device was used to
perform core needle biopsy of calcifications within the superior
slightly lateral right breast middle depth using a cranial approach.
Specimen radiograph was performed showing calcifications. Specimens
with calcifications are identified for pathology.

Lesion quadrant: Upper outer quadrant

At the conclusion of the procedure, coil shaped tissue marker clip
was deployed into the biopsy cavity. Follow-up 2-view mammogram was
performed and dictated separately.
IMPRESSION: Stereotactic-guided biopsy of right breast calcifications, 2 sites.
No apparent complications.

ADDENDUM:
PATHOLOGY revealed:

1. Breast, right, needle core biopsy, lateral periareolar, x clip-
FIBROCYSTIC CHANGE WITH CALCIFICATIONS. PSEUDOANGIOMATOUS STROMAL
HYPERPLASIA.

2. Breast, right, needle core biopsy, upper outer middle depth, coil
clip- FLAT EPITHELIAL ATYPIA WITH CALCIFICATIONS.

Pathology results are CONCORDANT with imaging findings, per Dr. NOMASIBULELE
NOMASIBULELE.

Pathology results and recommendations below were discussed with
patient by telephone on [DATE]. Patient reported biopsy site doing
well with slight tenderness at the site. Post biopsy care
instructions were reviewed and questions were answered. Patient was
instructed to call [REDACTED] if any
concerns or questions arise related to the biopsy.

Recommendation: Surgical referral. Site #2 requires surgical
consultation; appointment arranged for patient to see Dr. NOMASIBULELE
NOMASIBULELE at [REDACTED] on [DATE] at 10:30 o'clock.

Addendum by NOMASIBULELE RN on [DATE].



Site 1 lateral periareolar: X clip

Using sterile technique and 1% Lidocaine as local anesthetic, under
stereotactic guidance, a 9 gauge vacuum assisted device was used to
perform core needle biopsy of calcifications within the lateral
periareolar right breast using a lateral approach. Specimen
radiograph was performed showing calcifications. Specimens with
calcifications are identified for pathology.

Lesion quadrant: Upper outer quadrant

At the conclusion of the procedure, X shaped tissue marker clip was
deployed into the biopsy cavity. Follow-up 2-view mammogram was
performed and dictated separately.

Site 2: Upper-outer middle depth: Coil clip

Using sterile technique and 1% Lidocaine as local anesthetic, under
stereotactic guidance, a 9 gauge vacuum assisted device was used to
perform core needle biopsy of calcifications within the superior
slightly lateral right breast middle depth using a cranial approach.
Specimen radiograph was performed showing calcifications. Specimens
with calcifications are identified for pathology.

Lesion quadrant: Upper outer quadrant

At the conclusion of the procedure, coil shaped tissue marker clip
was deployed into the biopsy cavity. Follow-up 2-view mammogram was
performed and dictated separately.
IMPRESSION: Stereotactic-guided biopsy of right breast calcifications, 2 sites.
No apparent complications.

## 2019-07-13 DIAGNOSIS — F411 Generalized anxiety disorder: Secondary | ICD-10-CM | POA: Diagnosis not present

## 2019-07-20 DIAGNOSIS — F411 Generalized anxiety disorder: Secondary | ICD-10-CM | POA: Diagnosis not present

## 2019-07-27 DIAGNOSIS — F411 Generalized anxiety disorder: Secondary | ICD-10-CM | POA: Diagnosis not present

## 2019-08-01 ENCOUNTER — Ambulatory Visit: Payer: Self-pay | Admitting: Surgery

## 2019-08-01 DIAGNOSIS — N6091 Unspecified benign mammary dysplasia of right breast: Secondary | ICD-10-CM | POA: Diagnosis not present

## 2019-08-01 DIAGNOSIS — N6081 Other benign mammary dysplasias of right breast: Secondary | ICD-10-CM

## 2019-08-01 DIAGNOSIS — Z803 Family history of malignant neoplasm of breast: Secondary | ICD-10-CM | POA: Diagnosis not present

## 2019-08-01 NOTE — H&P (Signed)
Julia [name] Appointment: 08/01/2019 11:00 AM Location: [facility] Patient #: [id number] DOB: [birth year] Married / Language: [place] / Race: White Female  History of Present Illness Julia Fus A. Sriyan Cutting MD; 08/01/2019 1:05 PM) Patient words: Patient sent at the request of the breast Center of [facility] Dr. Judyann Munson for abnormal screening mammogram. She is noted to have 2 clusters of atypical right breast microcalcifications. One was in the periareolar region and the second was in the right upper outer quadrant. The right upper quadrant biopsies showed flat atypia while the other showed fibrocystic change with microcalcifications. She denies any history of breast pain, nipple discharge or breast mass. She states her paternal aunts had breast cancer in her 23s as well as a other female GYN malignancy. The risks for family history is unknown. She has no specific complaints today and did well with her biopsy.     CLINICAL DATA: Patient was called back from screening mammogram for right breast calcifications.  EXAM: DIGITAL DIAGNOSTIC RIGHT MAMMOGRAM WITH CAD  COMPARISON: Baseline screening mammogram dated 04/23/2019.  ACR Breast Density Category c: The breast tissue is heterogeneously dense, which may obscure small masses.  FINDINGS: Additional images of the right breast were performed. There are grouped coarse heterogeneous calcifications in the upper slightly outer quadrant of the right breast spanning an area of 7 mm. There is a second group of coarse heterogeneous calcifications spanning 4 mm in the upper-outer quadrant of the right breast.  Mammographic images were processed with CAD.  IMPRESSION: Two areas of indeterminate calcifications in the upper-outer quadrant of the right breast.  RECOMMENDATION: Stereotactic biopsy of the 2 groups of calcifications in the upper-outer quadrant of the right breast is recommended. The biopsies will be scheduled at the  patient's convenience.  I have discussed the findings and recommendations with the patient. If applicable, a reminder letter will be sent to the patient regarding the next appointment.  BI-RADS CATEGORY 4: Suspicious.   Electronically Signed By: Julia [name] M.D. On: 06/28/2019 16:18      Diagnosis 1. Breast, right, needle core biopsy, lateral periareolar, x clip - FIBROCYSTIC CHANGE WITH CALCIFICATIONS. PSEUDOANGIOMATOUS STROMAL HYPERPLASIA. 2. Breast, right, needle core biopsy, upper outer middle depth, coil clip - FLAT EPITHELIAL ATYPIA WITH CALCIFICATIONS.  The patient is a 44 year old female.   Past Surgical History Julia [name], CMA; 08/01/2019 11:09 AM) Oral Surgery Tonsillectomy  Diagnostic Studies History (Julia [name], CMA; 08/01/2019 11:09 AM) Mammogram within last year Pap Smear 1-5 years ago  Allergies (Julia [name], CMA; 08/01/2019 11:10 AM) Ciprofloxacin *CHEMICALS* Percocet *ANALGESICS - OPIOID* Allergies Reconciled  Medication History (Julia [name], CMA; 08/01/2019 11:11 AM) Adderall XR (25MG  Capsule ER 24HR, Oral) Active. Xanax (1MG  Tablet, Oral) Active. Amphetamine-Dextroamphetamine (30MG  Tablet, Oral) Active. Pantoprazole Sodium (40MG  Tablet DR, Oral) Active. Topiramate (100MG  Tablet, Oral) Active. buPROPion HCl ER (XL) (300MG  Tablet ER 24HR, Oral) Active. FLUoxetine HCl (20MG  Capsule, Oral) Active. Trulance (3MG  Tablet, Oral) Active. Medications Reconciled  Social History , CMA; 08/01/2019 11:09 AM) Alcohol use Occasional alcohol use. Caffeine use Carbonated beverages. No drug use Tobacco use Never smoker.  Family History (Julia , CMA; 08/01/2019 11:09 AM) Alcohol Abuse Brother, Family Members In General. Breast Cancer Family Members In General. Cancer Family Members In General, Father. Cerebrovascular Accident Father. Colon Polyps Father. Depression Brother, Father, Mother. Diabetes Mellitus  Family Members In General. Heart Disease Father. Hypertension Father. Melanoma Family Members In General. Ovarian Cancer Family Members In General. Seizure disorder Daughter.  Pregnancy / Birth History (  Julia [name], CMA; 08/01/2019 11:09 AM) Age at menarche 15 years. Contraceptive History Oral contraceptives. Gravida 3 Length (months) of breastfeeding 7-12 Maternal age 50-25 Para 3 Regular periods  Other Problems (Julia [name], CMA; 08/01/2019 11:09 AM) Anxiety Disorder Back Pain Depression Gastroesophageal Reflux Disease Lump In Breast Other disease, cancer, significant illness Seizure Disorder     Review of Systems (Julia [name] CMA; 08/01/2019 11:09 AM) General Not Present- Appetite Loss, Chills, Fatigue, Fever, Night Sweats, Weight Gain and Weight Loss. Skin Not Present- Change in Wart/Mole, Dryness, Hives, Jaundice, New Lesions, Non-Healing Wounds, Rash and Ulcer. HEENT Present- Seasonal Allergies. Not Present- Earache, Hearing Loss, Hoarseness, Nose Bleed, Oral Ulcers, Ringing in the Ears, Sinus Pain, Sore Throat, Visual Disturbances, Wears glasses/contact lenses and Yellow Eyes. Respiratory Not Present- Bloody sputum, Chronic Cough, Difficulty Breathing, Snoring and Wheezing. Breast Present- Breast Mass. Not Present- Breast Pain, Nipple Discharge and Skin Changes. Cardiovascular Present- Leg Cramps. Not Present- Chest Pain, Difficulty Breathing Lying Down, Palpitations, Rapid Heart Rate, Shortness of Breath and Swelling of Extremities. Gastrointestinal Present- Abdominal Pain, Bloating, Constipation, Excessive gas, Hemorrhoids and Nausea. Not Present- Bloody Stool, Change in Bowel Habits, Chronic diarrhea, Difficulty Swallowing, Gets full quickly at meals, Indigestion, Rectal Pain and Vomiting. Female Genitourinary Present- Urgency. Not Present- Frequency, Nocturia, Painful Urination and Pelvic Pain. Musculoskeletal Present- Back Pain. Not Present- Joint  Pain, Joint Stiffness, Muscle Pain, Muscle Weakness and Swelling of Extremities. Neurological Present- Seizures. Not Present- Decreased Memory, Fainting, Headaches, Numbness, Tingling, Tremor, Trouble walking and Weakness. Psychiatric Present- Anxiety and Depression. Not Present- Bipolar, Change in Sleep Pattern, Fearful and Frequent crying. Endocrine Not Present- Cold Intolerance, Excessive Hunger, Hair Changes, Heat Intolerance, Hot flashes and New Diabetes. Hematology Present- Easy Bruising. Not Present- Blood Thinners, Excessive bleeding, Gland problems, HIV and Persistent Infections.  Vitals (Julia [name] CMA; 08/01/2019 11:12 AM) 08/01/2019 11:11 AM Weight: 204 lb Height: 73in Body Surface Area: 2.17 m Body Mass Index: 26.91 kg/m  Temp.: 97.64F  Pulse: 107 (Regular)         Physical Exam (Julia Vinsant A. Asianna Brundage MD; 08/01/2019 1:06 PM)  General Mental Status-Alert. General Appearance-Consistent with stated age. Hydration-Well hydrated. Voice-Normal.  Head and Neck Head-normocephalic, atraumatic with no lesions or palpable masses. Trachea-midline. Thyroid Gland Characteristics - normal size and consistency.  Chest and Lung Exam Chest and lung exam reveals -quiet, even and easy respiratory effort with no use of accessory muscles and on auscultation, normal breath sounds, no adventitious sounds and normal vocal resonance. Inspection Chest Wall - Normal. Back - normal.  Breast Breast - Left-Symmetric, Non Tender, No Biopsy scars, no Dimpling - Left, No Inflammation, No Lumpectomy scars, No Mastectomy scars, No Peau d' Orange. Breast - Right-Symmetric, Non Tender, No Biopsy scars, no Dimpling - Right, No Inflammation, No Lumpectomy scars, No Mastectomy scars, No Peau d' Orange. Breast Lump-No Palpable Breast Mass.  Cardiovascular Cardiovascular examination reveals -normal heart sounds, regular rate and rhythm with no murmurs and normal pedal pulses  bilaterally.  Neurologic Neurologic evaluation reveals -alert and oriented x 3 with no impairment of recent or remote memory. Mental Status-Normal.  Neuropsychiatric The patient's mood and affect are described as -normal. Associations-intact. Judgment and Insight-insight is appropriate concerning matters relevant to self.  Musculoskeletal Normal Exam - Left-Upper Extremity Strength Normal and Lower Extremity Strength Normal. Normal Exam - Right-Upper Extremity Strength Normal and Lower Extremity Strength Normal.  Lymphatic Head & Neck  General Head & Neck Lymphatics: Bilateral - Description - Normal. Axillary  General Axillary Region: Bilateral -  Description - Normal. Tenderness - Non Tender.    Assessment & Plan (Julia Topper A. Zi Sek MD; 08/01/2019 1:16 PM)  ATYPICAL DUCTAL HYPERPLASIA OF RIGHT BREAST (N60.91) Impression: Flat atypia noted on biopsy. This is in the right upper quadrant with the calcifications. Recommend right breast lumpectomy with seed localization. The other area is benign and can be followed. Risk of lumpectomy include bleeding, infection, seroma, more surgery, use of seed/wire, wound care, cosmetic deformity and the need for other treatments, death , blood clots, death. Pt agrees to proceed.  Total time 45 minutes discussing surgery, genetic counseling, complications of surgery, documentation, and face-to-face time  Current Plans You are being scheduled for surgery- Our schedulers will call you.  You should hear from our office's scheduling department within 5 working days about the location, date, and time of surgery. We try to make accommodations for patient's preferences in scheduling surgery, but sometimes the OR schedule or the surgeon's schedule prevents [place] from making those accommodations.  If you have not heard from our office [phone]) in 5 working days, call the office and ask for your surgeon's nurse.  If you have other  questions about your diagnosis, plan, or surgery, call the office and ask for your surgeon's nurse.  Pt Education - [facility] Breast Biopsy HCI: discussed with patient and provided information.  FAMILY HISTORY OF BREAST CANCER (Z80.3) Impression: Genetics   Lifetime risk over 20% therefore considered high risk based upon [employer] MODEL

## 2019-08-03 DIAGNOSIS — F411 Generalized anxiety disorder: Secondary | ICD-10-CM | POA: Diagnosis not present

## 2019-08-21 ENCOUNTER — Telehealth: Payer: Self-pay | Admitting: Genetic Counselor

## 2019-08-21 NOTE — Telephone Encounter (Signed)
Pt called to reschedule genetics appt. Confirmed 8/9 with pt. Pt made aware to arrive ahead of scheduled time and to bring photo ID and insurance card(s)

## 2019-08-22 ENCOUNTER — Inpatient Hospital Stay: Payer: BC Managed Care – PPO | Admitting: Genetic Counselor

## 2019-08-22 ENCOUNTER — Inpatient Hospital Stay: Payer: BC Managed Care – PPO

## 2019-08-31 ENCOUNTER — Other Ambulatory Visit: Payer: Self-pay | Admitting: Surgery

## 2019-08-31 DIAGNOSIS — N6081 Other benign mammary dysplasias of right breast: Secondary | ICD-10-CM

## 2019-08-31 DIAGNOSIS — F411 Generalized anxiety disorder: Secondary | ICD-10-CM | POA: Diagnosis not present

## 2019-09-04 ENCOUNTER — Other Ambulatory Visit: Payer: Self-pay

## 2019-09-04 ENCOUNTER — Other Ambulatory Visit: Payer: Self-pay | Admitting: Genetic Counselor

## 2019-09-04 ENCOUNTER — Inpatient Hospital Stay: Payer: BC Managed Care – PPO

## 2019-09-04 ENCOUNTER — Encounter: Payer: Self-pay | Admitting: Genetic Counselor

## 2019-09-04 ENCOUNTER — Inpatient Hospital Stay: Payer: BC Managed Care – PPO | Attending: Genetic Counselor | Admitting: Genetic Counselor

## 2019-09-04 DIAGNOSIS — Z803 Family history of malignant neoplasm of breast: Secondary | ICD-10-CM

## 2019-09-04 DIAGNOSIS — Z8052 Family history of malignant neoplasm of bladder: Secondary | ICD-10-CM | POA: Insufficient documentation

## 2019-09-04 DIAGNOSIS — Z8041 Family history of malignant neoplasm of ovary: Secondary | ICD-10-CM

## 2019-09-04 LAB — GENETIC SCREENING ORDER

## 2019-09-04 NOTE — Progress Notes (Signed)
REFERRING PROVIDER: Erroll Luna, MD [address] [place],  [place] [address]  PRIMARY PROVIDER:  Marton Redwood, MD  PRIMARY REASON FOR VISIT:  1. Family history of ovarian cancer   2. Family history of breast cancer   3. Family history of bladder cancer      HISTORY OF PRESENT ILLNESS:   [name], a 44 y.o. female, was seen for a [facility] cancer genetics consultation at the request of Dr. Brantley Stage due to a family history of cancer.  Ms. Charlet presents to clinic today to discuss the possibility of a hereditary predisposition to cancer, genetic testing, and to further clarify her future cancer risks, as well as potential cancer risks for family members.   [name] is a 44 y.o. female with no personal history of cancer.  She has a diagnosis of ADH and will have surgery on October 05, 2019.  CANCER HISTORY:  Oncology History   No history exists.     RISK FACTORS:  Menarche was at age 68-12.  First live birth at age 78.  OCP use for approximately <5 years.  Ovaries intact: yes.  Hysterectomy: no.  Menopausal status: perimenopausal.  HRT use: 0 years. Colonoscopy: no; not examined. Mammogram within the last year: yes. Number of breast biopsies: 1 biopsy - atypical ductal hyperplasia. Up to date with pelvic exams: yes. Any excessive radiation exposure in the past: no  Past Medical History:  Diagnosis Date  . Acute posterior anal fissure   . ADHD (attention deficit hyperactivity disorder)   . Anemia   . Anorexia    history of eating disorder  . Anxiety   . Chronic idiopathic constipation   . Epilepsy ([place])   . Family history of bladder cancer   . Family history of breast cancer   . Family history of ovarian cancer   . GERD (gastroesophageal reflux disease)   . Obsessive compulsive disorder   . OCD (obsessive compulsive disorder)   . Seizures ([place]) 05/17/14    Past Surgical History:  Procedure Laterality Date  . LIPOSUCTION TRUNK    .  TONSILLECTOMY      Social History   Socioeconomic History  . Marital status: Married    Spouse name: Not on file  . Number of children: 3  . Years of education: Not on file  . Highest education level: Not on file  Occupational History  . Occupation: home maker  Tobacco Use  . Smoking status: Never Smoker  . Smokeless tobacco: Never Used  Substance and Sexual Activity  . Alcohol use: Yes    Alcohol/week: 1.0 standard drink    Types: 1 Glasses of wine per week    Comment: occasional wine  . Drug use: No  . Sexual activity: Yes    Birth control/protection: None  Other Topics Concern  . Not on file  Social History Narrative   Married. Has 3 children. A full time homemaker. Graduated from college with a degree in social work.   Has a private health insurance through her spouse.   Social Determinants of Health   Financial Resource Strain:   . Difficulty of Paying Living Expenses:   Food Insecurity:   . Worried About Charity fundraiser in the Last Year:   . Arboriculturist in the Last Year:   Transportation Needs:   . Film/video editor (Medical):   Marland Kitchen Lack of Transportation (Non-Medical):   Physical Activity:   . Days of Exercise per Week:   .  Minutes of Exercise per Session:   Stress:   . Feeling of Stress :   Social Connections:   . Frequency of Communication with Friends and Family:   . Frequency of Social Gatherings with Friends and Family:   . Attends Religious Services:   . Active Member of Clubs or Organizations:   . Attends Archivist Meetings:   Marland Kitchen Marital Status:      FAMILY HISTORY:  We obtained a detailed, 4-generation family history.  Significant diagnoses are listed below: Family History  Problem Relation Age of Onset  . Parkinsonism Mother 36  . Hypertension Father   . Stroke Father   . Lymphoma Father 38  . Colon polyps Father   . Colon polyps Brother        at least 3  . Rheum arthritis Maternal Grandfather   . Parkinsonism  Maternal Grandfather   . Diabetes Maternal Grandmother   . Parkinsonism Paternal Grandmother   . Autism spectrum disorder Daughter        18q12.2 del  . Skin cancer Maternal Aunt        BCC and SCC  . Breast cancer Paternal Aunt 75  . Ovarian cancer Paternal Aunt        dx in her early 36s  . Bladder Cancer Paternal Aunt        dx in her 74s  . Drug abuse Paternal Uncle   . Drug abuse Paternal Aunt   . Colon cancer Neg Hx   . Esophageal cancer Neg Hx   . Stomach cancer Neg Hx   . Liver disease Neg Hx   . Pancreatic cancer Neg Hx     The patient has two sons and a daughter who are cancer free.  Her daughter has a chromosomal abnormality called 18q12.2 deletion.  She has two brothers.  One brother has colon polyps, 3 of which were sent out for testing.  The other brother has not been tested, and does not have cancer.  Her father is deceased and her mother is living.  The patient's father had colon polyps and a diagnosis of Lymphoma.  He had three brothers and three sisters.  One sister had ovarian cancer in her early 38's, bladder cancer in her 55's and breast cancer at 40.  It is unknown if she had genetic testing.  The paternal grandparents are deceased.  The grandmother had parkinson's disease, the grandfather died at 87.  His maternal aunt also had breast cancer.  The patient's mother was diagnosed with parkinson's disease around age 29.  She has two sisters, one who died in a car accident around age 33 and the other who has had multiple BCC and SCC's.  This second sister has a son who just had a colonoscopy and had 75 polyps removed and possibly has colon cancer.  The maternal grandparents are deceased. The grandfather also had parkinson's disease.  Ms. Kuyper is unaware of previous family history of genetic testing for hereditary cancer risks. Patient's maternal ancestors are of Caucasian descent, and paternal ancestors are of Caucasian descent. There is no reported Ashkenazi Jewish  ancestry. There is no known consanguinity.  GENETIC COUNSELING ASSESSMENT: [name] is a 44 y.o. female with a family history of cancer which is somewhat suggestive of a hereditary cancer syndrome and predisposition to cancer given the young age of onset and combination of cancer in her aunt. We, therefore, discussed and recommended the following at today's visit.   DISCUSSION: We discussed that  5 - 10% of breast cancer and up to 20% of ovarian cancer is hereditary, with most cases associated with BRCA mutations.  There are other genes that can be associated with hereditary breast cancer syndromes.  These include ATM, CHEK2 and PALB2.  We discussed that testing is beneficial for several reasons including knowing how to follow individuals after completing their treatment, identifying whether potential treatment options such as PARP inhibitors would be beneficial, and understand if other family members could be at risk for cancer and allow them to undergo genetic testing.   We reviewed the characteristics, features and inheritance patterns of hereditary cancer syndromes. We also discussed genetic testing, including the appropriate family members to test, the process of testing, insurance coverage and turn-around-time for results. We discussed the implications of a negative, positive, carrier and/or variant of uncertain significant result. We recommended Ms. Hilaire pursue genetic testing for the multi-cancer gene panel. The Common Hereditary Gene Panel offered by [facility] includes sequencing and/or deletion duplication testing of the following 48 genes: APC, ATM, AXIN2, BARD1, BMPR1A, BRCA1, BRCA2, BRIP1, CDH1, CDK4, CDKN2A (p14ARF), CDKN2A (p16INK4a), CHEK2, CTNNA1, DICER1, EPCAM (Deletion/duplication testing only), GREM1 (promoter region deletion/duplication testing only), KIT, MEN1, MLH1, MSH2, MSH3, MSH6, MUTYH, NBN, NF1, NHTL1, PALB2, PDGFRA, PMS2, POLD1, POLE, PTEN, RAD50, RAD51C, RAD51D, RNF43, SDHB,  SDHC, SDHD, SMAD4, SMARCA4. STK11, TP53, TSC1, TSC2, and VHL.  The following genes were evaluated for sequence changes only: SDHA and HOXB13 c.251G>A variant only.   Based on Ms. Simonet's family history of cancer, she meets medical criteria for genetic testing. Despite that she meets criteria, she may still have an out of pocket cost. We discussed that if her out of pocket cost for testing is over $100, the laboratory will call and confirm whether she wants to proceed with testing.  If the out of pocket cost of testing is less than $100 she will be billed by the genetic testing laboratory.   PLAN: After considering the risks, benefits, and limitations, Ms. Jersey provided informed consent to pursue genetic testing and the blood sample was sent to [facility] for analysis of the multi-cancer gene panel. Results should be available within approximately 2-3 weeks' time, at which point they will be disclosed by telephone to Ms. Parkhurst, as will any additional recommendations warranted by these results. Ms. Metzger will receive a summary of her genetic counseling visit and a copy of her results once available. This information will also be available in Epic.   Lastly, we encouraged Ms. Eisenhuth to remain in contact with cancer genetics annually so that we can continuously update the family history and inform her of any changes in cancer genetics and testing that may be of benefit for this family.   Ms. Baugh questions were answered to her satisfaction today. Our contact information was provided should additional questions or concerns arise. Thank you for the referral and allowing [place] to share in the care of your patient.   Camrynn Mcclintic P. Florene Glen, [place], [facility] Licensed, Insurance risk surveyor [email or web address] phone: [phone]  The patient was seen for a total of 45 minutes in face-to-face genetic counseling.  This patient was discussed with Drs. Magrinat, Lindi Adie and/or Burr Medico who agrees with  the above.    _______________________________________________________________________ For Office Staff:  Number of people involved in session: 2 Was an Intern/ student involved with case: no

## 2019-09-05 MED ORDER — LIDOCAINE HCL 2 % IJ SOLN
INTRAMUSCULAR | Status: AC
  Filled 2019-09-05: qty 20

## 2019-09-14 DIAGNOSIS — F411 Generalized anxiety disorder: Secondary | ICD-10-CM | POA: Diagnosis not present

## 2019-09-19 ENCOUNTER — Ambulatory Visit: Payer: Self-pay | Admitting: Genetic Counselor

## 2019-09-19 ENCOUNTER — Telehealth: Payer: Self-pay | Admitting: Genetic Counselor

## 2019-09-19 DIAGNOSIS — Z1379 Encounter for other screening for genetic and chromosomal anomalies: Secondary | ICD-10-CM

## 2019-09-19 NOTE — Telephone Encounter (Signed)
Revealed negative genetic testing.  Discussed that we do not know whythere is cancer in the family. It could be due to a different gene that we are not testing, or maybe our current technology may not be able to pick something up.  It will be important for her to keep in contact with genetics to keep up with whether additional testing may be needed.   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  These will not change medical management.  

## 2019-09-19 NOTE — Progress Notes (Signed)
HPI:  Ms. Bula was previously seen in the [facility] due to a family history of cancer and concerns regarding a hereditary predisposition to cancer. Please refer to our prior cancer genetics clinic note for more information regarding our discussion, assessment and recommendations, at the time. Ms. Rothschild recent genetic test results were disclosed to her, as were recommendations warranted by these results. These results and recommendations are discussed in more detail below.  CANCER HISTORY:  Oncology History   No history exists.    FAMILY HISTORY:  We obtained a detailed, 4-generation family history.  Significant diagnoses are listed below: Family History  Problem Relation Age of Onset  . Parkinsonism Mother 77  . Hypertension Father   . Stroke Father   . Lymphoma Father 20  . Colon polyps Father   . Colon polyps Brother        at least 3  . Rheum arthritis Maternal Grandfather   . Parkinsonism Maternal Grandfather   . Diabetes Maternal Grandmother   . Parkinsonism Paternal Grandmother   . Autism spectrum disorder Daughter        18q12.2 del  . Skin cancer Maternal Aunt        BCC and SCC  . Breast cancer Paternal Aunt 71  . Ovarian cancer Paternal Aunt        dx in her early 36s  . Bladder Cancer Paternal Aunt        dx in her 47s  . Drug abuse Paternal Uncle   . Drug abuse Paternal Aunt   . Colon cancer Neg Hx   . Esophageal cancer Neg Hx   . Stomach cancer Neg Hx   . Liver disease Neg Hx   . Pancreatic cancer Neg Hx     The patient has two sons and a daughter who are cancer free.  Her daughter has a chromosomal abnormality called 18q12.2 deletion.  She has two brothers.  One brother has colon polyps, 3 of which were sent out for testing.  The other brother has not been tested, and does not have cancer.  Her father is deceased and her mother is living.  The patient's father had colon polyps and a diagnosis of Lymphoma.  He had three brothers  and three sisters.  One sister had ovarian cancer in her early 46's, bladder cancer in her 2's and breast cancer at 8.  It is unknown if she had genetic testing.  The paternal grandparents are deceased.  The grandmother had parkinson's disease, the grandfather died at 24.  His maternal aunt also had breast cancer.  The patient's mother was diagnosed with parkinson's disease around age 86.  She has two sisters, one who died in a car accident around age 72 and the other who has had multiple BCC and SCC's.  This second sister has a son who just had a colonoscopy and had 32 polyps removed and possibly has colon cancer.  The maternal grandparents are deceased. The grandfather also had parkinson's disease.  Ms. Brumley is unaware of previous family history of genetic testing for hereditary cancer risks. Patient's maternal ancestors are of Caucasian descent, and paternal ancestors are of Caucasian descent. There is no reported Ashkenazi Jewish ancestry. There is no known consanguinity.    GENETIC TEST RESULTS: Genetic testing reported out on September 17, 2019 through the multi-cancer panel found no pathogenic mutations. The Multi-Gene Panel offered by [facility] includes sequencing and/or deletion duplication testing of the following 85 genes: AIP, ALK, APC,  ATM, AXIN2,BAP1,  BARD1, BLM, BMPR1A, BRCA1, BRCA2, BRIP1, CASR, CDC73, CDH1, CDK4, CDKN1B, CDKN1C, CDKN2A (p14ARF), CDKN2A (p16INK4a), CEBPA, CHEK2, CTNNA1, DICER1, DIS3L2, EGFR (c.2369C>T, p.Thr790Met variant only), EPCAM (Deletion/duplication testing only), FH, FLCN, GATA2, GPC3, GREM1 (Promoter region deletion/duplication testing only), HOXB13 (c.251G>A, p.Gly84Glu), HRAS, KIT, MAX, MEN1, MET, MITF (c.952G>A, p.Glu318Lys variant only), MLH1, MSH2, MSH3, MSH6, MUTYH, NBN, NF1, NF2, NTHL1, PALB2, PDGFRA, PHOX2B, PMS2, POLD1, POLE, POT1, PRKAR1A, PTCH1, PTEN, RAD50, RAD51C, RAD51D, RB1, RECQL4, RET, RNF43, RUNX1, SDHAF2, SDHA (sequence changes only), SDHB, SDHC,  SDHD, SMAD4, SMARCA4, SMARCB1, SMARCE1, STK11, SUFU, TERC, TERT, TMEM127, TP53, TSC1, TSC2, VHL, WRN and WT1.  The test report has been scanned into EPIC and is located under the Molecular Pathology section of the Results Review tab.  A portion of the result report is included below for reference.     We discussed with Ms. Joa that because current genetic testing is not perfect, it is possible there may be a gene mutation in one of these genes that current testing cannot detect, but that chance is small.  We also discussed, that there could be another gene that has not yet been discovered, or that we have not yet tested, that is responsible for the cancer diagnoses in the family. It is also possible there is a hereditary cause for the cancer in the family that Ms. Riles did not inherit and therefore was not identified in her testing.  Therefore, it is important to remain in touch with cancer genetics in the future so that we can continue to offer Ms. Stefanick the most up to date genetic testing.   Genetic testing did identify two Variants of uncertain significance (VUS) - one in the APC gene called c.2845A>G and a second in the EGFR gene called c.2885G>A.  At this time, it is unknown if these variants are associated with increased cancer risk or if they are normal findings, but most variants such as these get reclassified to being inconsequential. They should not be used to make medical management decisions. With time, we suspect the lab will determine the significance of these variants, if any. If we do learn more about them, we will try to contact Ms. Crayton to discuss it further. However, it is important to stay in touch with [place] periodically and keep the address and phone number up to date.  ADDITIONAL GENETIC TESTING: We discussed with Ms. Zanetti that her genetic testing was fairly extensive.  If there are genes identified to increase cancer risk that can be analyzed in the future, we would be happy to  discuss and coordinate this testing at that time.    CANCER SCREENING RECOMMENDATIONS: Ms. Bourget's test result is considered negative (normal).  This means that we have not identified a hereditary cause for her family history of cancer at this time. Most cancers happen by chance and this negative test suggests that her cancer may fall into this category.    While reassuring, this does not definitively rule out a hereditary predisposition to cancer. It is still possible that there could be genetic mutations that are undetectable by current technology. There could be genetic mutations in genes that have not been tested or identified to increase cancer risk.  Therefore, it is recommended she continue to follow the cancer management and screening guidelines provided by her primary healthcare provider.   An individual's cancer risk and medical management are not determined by genetic test results alone. Overall cancer risk assessment incorporates additional factors, including personal medical history,  family history, and any available genetic information that may result in a personalized plan for cancer prevention and surveillance  RECOMMENDATIONS FOR FAMILY MEMBERS:  Individuals in this family might be at some increased risk of developing cancer, over the general population risk, simply due to the family history of cancer.  We recommended women in this family have a yearly mammogram beginning at age 35, or 71 years younger than the earliest onset of cancer, an annual clinical breast exam, and perform monthly breast self-exams. Women in this family should also have a gynecological exam as recommended by their primary provider. All family members should be referred for colonoscopy starting at age 4.  FOLLOW-UP: Lastly, we discussed with Ms. Schnieders that cancer genetics is a rapidly advancing field and it is possible that new genetic tests will be appropriate for her and/or her family members in the future. We  encouraged her to remain in contact with cancer genetics on an annual basis so we can update her personal and family histories and let her know of advances in cancer genetics that may benefit this family.   Our contact number was provided. Ms. Pion's questions were answered to her satisfaction, and she knows she is welcome to call us at anytime with additional questions or concerns.   Roma Kayser, [place], [facility] Licensed, Certified Genetic Counselor [email or web address]

## 2019-09-21 DIAGNOSIS — F411 Generalized anxiety disorder: Secondary | ICD-10-CM | POA: Diagnosis not present

## 2019-09-29 ENCOUNTER — Encounter (HOSPITAL_BASED_OUTPATIENT_CLINIC_OR_DEPARTMENT_OTHER): Payer: Self-pay | Admitting: Surgery

## 2019-09-29 ENCOUNTER — Other Ambulatory Visit: Payer: Self-pay

## 2019-10-03 ENCOUNTER — Other Ambulatory Visit (HOSPITAL_COMMUNITY)
Admission: RE | Admit: 2019-10-03 | Discharge: 2019-10-03 | Disposition: A | Discharge status: Home / Self Care | Payer: BC Managed Care – PPO | Source: Ambulatory Visit | Attending: Surgery | Admitting: Surgery

## 2019-10-03 DIAGNOSIS — Z20822 Contact with and (suspected) exposure to covid-19: Secondary | ICD-10-CM | POA: Diagnosis not present

## 2019-10-03 DIAGNOSIS — Z01812 Encounter for preprocedural laboratory examination: Secondary | ICD-10-CM | POA: Diagnosis not present

## 2019-10-03 LAB — SARS CORONAVIRUS 2 (TAT 6-24 HRS): SARS Coronavirus 2: NEGATIVE

## 2019-10-04 ENCOUNTER — Ambulatory Visit
Admission: RE | Admit: 2019-10-04 | Discharge: 2019-10-04 | Disposition: A | Discharge status: Home / Self Care | Payer: BC Managed Care – PPO | Source: Ambulatory Visit | Attending: Surgery | Admitting: Surgery

## 2019-10-04 ENCOUNTER — Other Ambulatory Visit: Payer: Self-pay

## 2019-10-04 ENCOUNTER — Encounter (HOSPITAL_BASED_OUTPATIENT_CLINIC_OR_DEPARTMENT_OTHER)
Admission: RE | Admit: 2019-10-04 | Discharge: 2019-10-04 | Disposition: A | Discharge status: Home / Self Care | Payer: BC Managed Care – PPO | Source: Ambulatory Visit | Attending: Surgery | Admitting: Surgery

## 2019-10-04 DIAGNOSIS — Z803 Family history of malignant neoplasm of breast: Secondary | ICD-10-CM | POA: Diagnosis not present

## 2019-10-04 DIAGNOSIS — F419 Anxiety disorder, unspecified: Secondary | ICD-10-CM | POA: Diagnosis not present

## 2019-10-04 DIAGNOSIS — Z01818 Encounter for other preprocedural examination: Secondary | ICD-10-CM | POA: Diagnosis not present

## 2019-10-04 DIAGNOSIS — Z881 Allergy status to other antibiotic agents status: Secondary | ICD-10-CM | POA: Diagnosis not present

## 2019-10-04 DIAGNOSIS — Z79899 Other long term (current) drug therapy: Secondary | ICD-10-CM | POA: Diagnosis not present

## 2019-10-04 DIAGNOSIS — Z8041 Family history of malignant neoplasm of ovary: Secondary | ICD-10-CM | POA: Diagnosis not present

## 2019-10-04 DIAGNOSIS — Z885 Allergy status to narcotic agent status: Secondary | ICD-10-CM | POA: Diagnosis not present

## 2019-10-04 DIAGNOSIS — N6091 Unspecified benign mammary dysplasia of right breast: Secondary | ICD-10-CM | POA: Diagnosis not present

## 2019-10-04 DIAGNOSIS — N6081 Other benign mammary dysplasias of right breast: Secondary | ICD-10-CM

## 2019-10-04 LAB — CBC WITH DIFFERENTIAL/PLATELET
Abs Immature Granulocytes: 0.02 10*3/uL (ref 0.00–0.07)
Basophils Absolute: 0 10*3/uL (ref 0.0–0.1)
Basophils Relative: 0 %
Eosinophils Absolute: 0.1 10*3/uL (ref 0.0–0.5)
Eosinophils Relative: 2 %
HCT: 33.9 % — ABNORMAL LOW (ref 36.0–46.0)
Hemoglobin: 10.4 g/dL — ABNORMAL LOW (ref 12.0–15.0)
Immature Granulocytes: 0 %
Lymphocytes Relative: 28 %
Lymphs Abs: 2 10*3/uL (ref 0.7–4.0)
MCH: 26.2 pg (ref 26.0–34.0)
MCHC: 30.7 g/dL (ref 30.0–36.0)
MCV: 85.4 fL (ref 80.0–100.0)
Monocytes Absolute: 0.7 10*3/uL (ref 0.1–1.0)
Monocytes Relative: 9 %
Neutro Abs: 4.2 10*3/uL (ref 1.7–7.7)
Neutrophils Relative %: 61 %
Platelets: 294 10*3/uL (ref 150–400)
RBC: 3.97 MIL/uL (ref 3.87–5.11)
RDW: 15.9 % — ABNORMAL HIGH (ref 11.5–15.5)
WBC: 6.9 10*3/uL (ref 4.0–10.5)
nRBC: 0 % (ref 0.0–0.2)

## 2019-10-04 LAB — COMPREHENSIVE METABOLIC PANEL
ALT: 12 U/L (ref 0–44)
AST: 17 U/L (ref 15–41)
Albumin: 3.9 g/dL (ref 3.5–5.0)
Alkaline Phosphatase: 48 U/L (ref 38–126)
Anion gap: 9 (ref 5–15)
BUN: 8 mg/dL (ref 6–20)
CO2: 21 mmol/L — ABNORMAL LOW (ref 22–32)
Calcium: 9 mg/dL (ref 8.9–10.3)
Chloride: 109 mmol/L (ref 98–111)
Creatinine, Ser: 0.93 mg/dL (ref 0.44–1.00)
GFR calc Af Amer: >60 mL/min (ref >60)
GFR calc non Af Amer: >60 mL/min (ref >60)
Glucose, Bld: 86 mg/dL (ref 70–99)
Potassium: 3.7 mmol/L (ref 3.5–5.1)
Sodium: 139 mmol/L (ref 135–145)
Total Bilirubin: 0.6 mg/dL (ref 0.3–1.2)
Total Protein: 6.7 g/dL (ref 6.5–8.1)

## 2019-10-04 LAB — POCT PREGNANCY, URINE: Preg Test, Ur: NEGATIVE

## 2019-10-04 IMAGING — MG MM PLC BREAST LOC DEV 1ST LESION INC*R*
7 series · 7 of 7 positions shown · non-contrast
Comparison: Previous exam(s).

CLINICAL DATA: Patient presents for radioactive seed localization
of a recently biopsied area of flat epithelial atypia in the right
breast.

EXAM:
MAMMOGRAPHIC GUIDED RADIOACTIVE SEED LOCALIZATION OF THE RIGHT
BREAST

[R ML (1 of 4)]
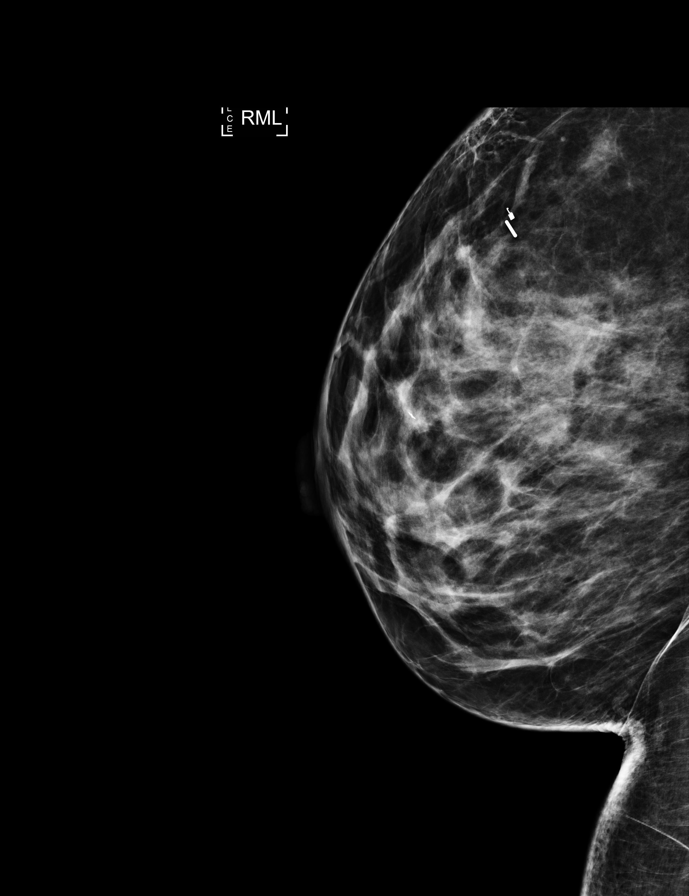

[R CC (1 of 3)]
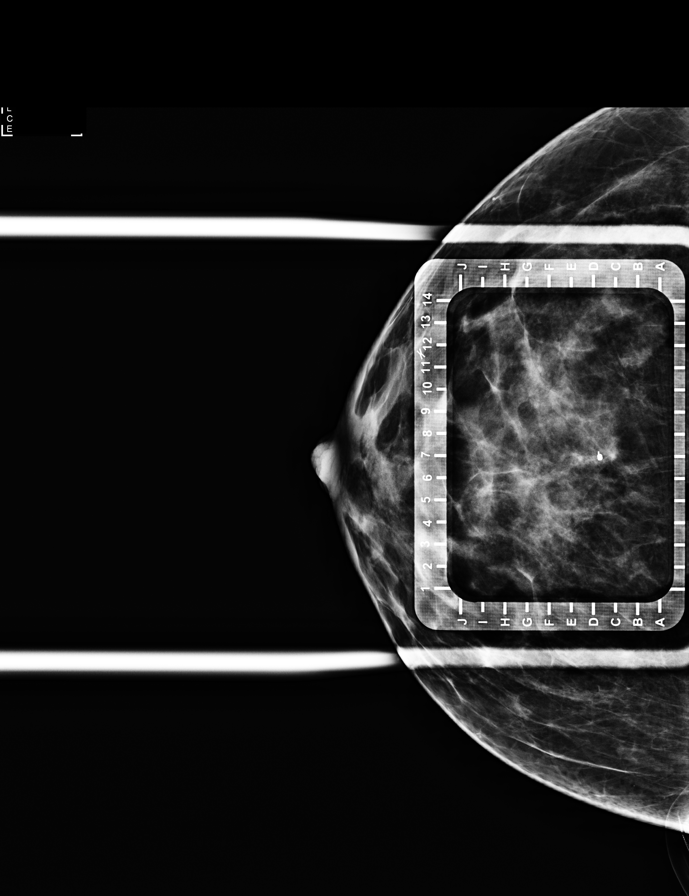

[R ML (2 of 4)]
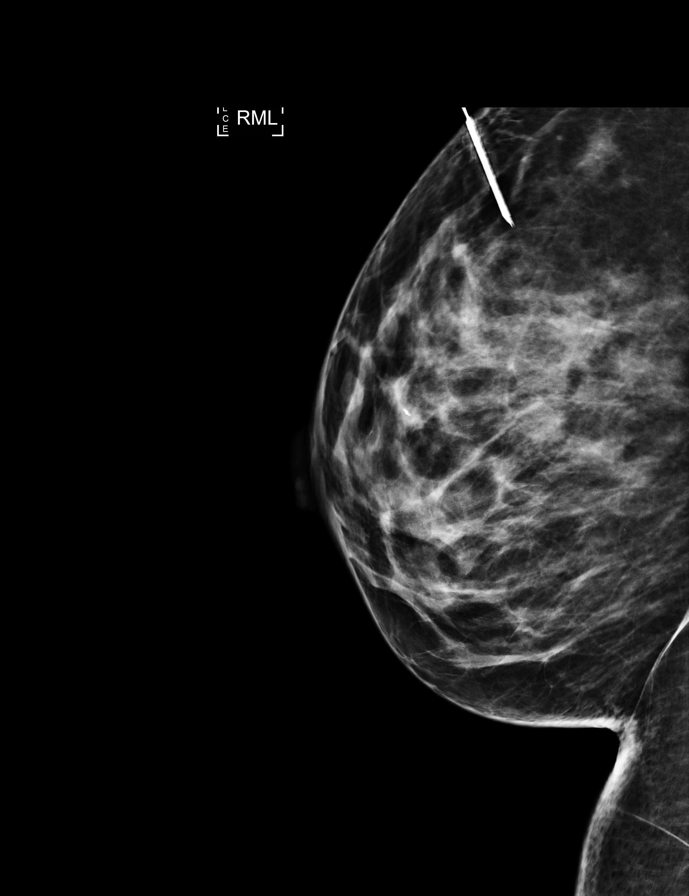

[R ML (3 of 4)]
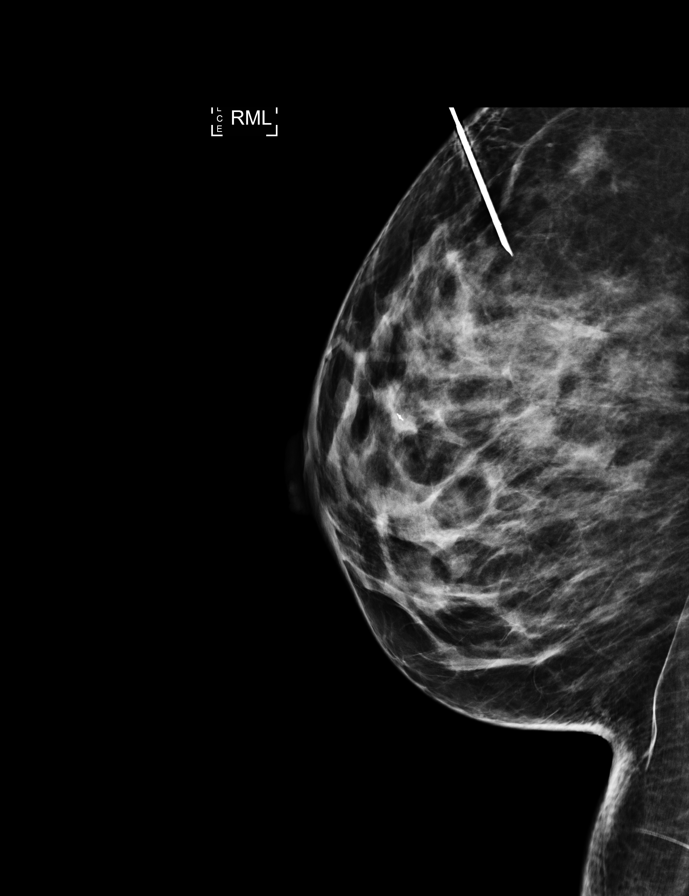

[R CC (2 of 3)]
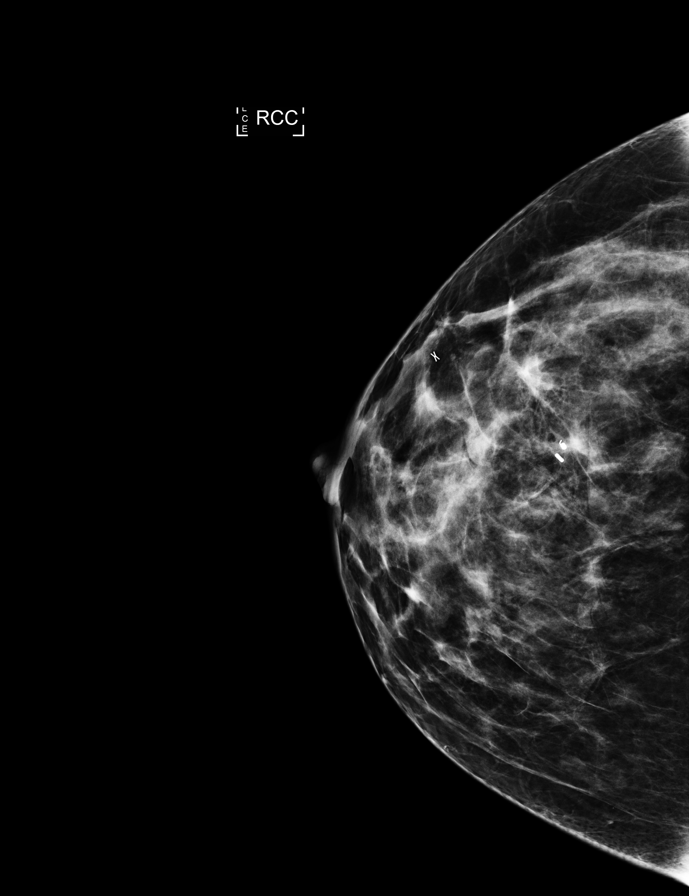

[R ML (4 of 4)]
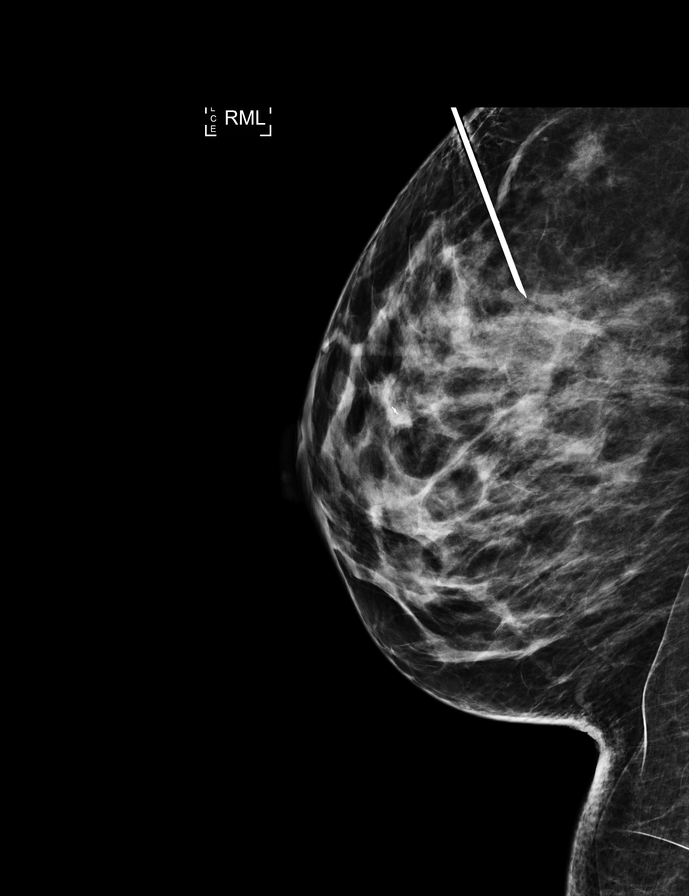

[R CC (3 of 3)]
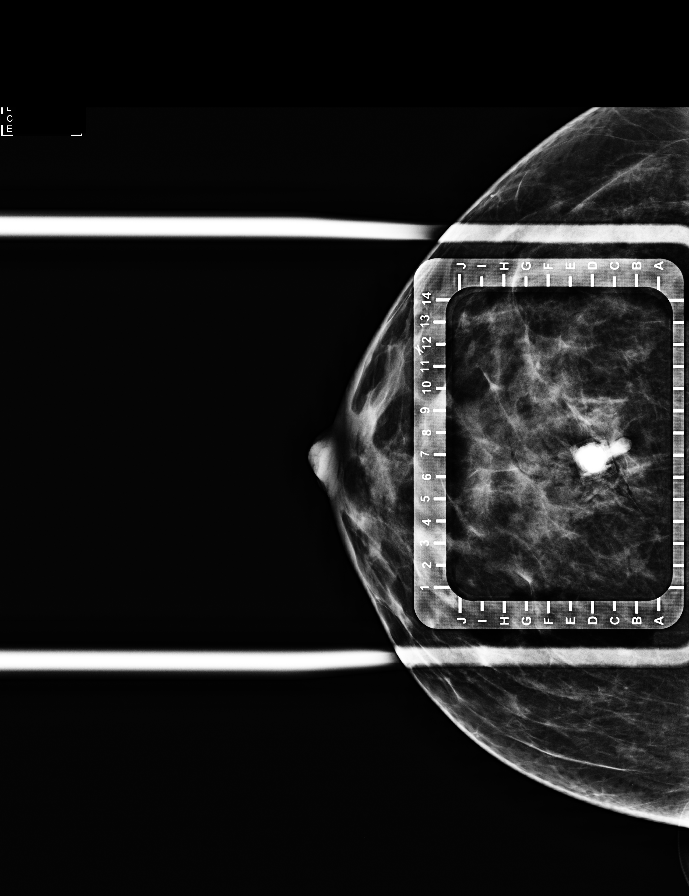

[7 of 7 positions shown; findings below may reference images not displayed]

FINDINGS: Patient presents for radioactive seed localization prior to surgical
excision. I met with the patient and we discussed the procedure of
seed localization including benefits and alternatives. We discussed
the high likelihood of a successful procedure. We discussed the
risks of the procedure including infection, bleeding, tissue injury
and further surgery. We discussed the low dose of radioactivity
involved in the procedure. Informed, written consent was given.

The usual time-out protocol was performed immediately prior to the
procedure.

Using mammographic guidance, sterile technique, 1% lidocaine and an
[MM] radioactive seed, the coil shaped biopsy clip was localized
using a superior approach. The follow-up mammogram images confirm
the seed in the expected location and were marked for Dr. CASTIVAL.

Follow-up survey of the patient confirms presence of the radioactive
seed.

Order number of [MM] seed:  [PHONE_NUMBER].

Total activity:  0.247 millicuries reference Date: [DATE]

The patient tolerated the procedure well and was released from the
[REDACTED]. She was given instructions regarding seed removal.
IMPRESSION: Radioactive seed localization of the right breast. No apparent
complications.

## 2019-10-04 NOTE — Progress Notes (Signed)
Surgical soap given to patient with instructions for use.  Patient verbalized understanding of instructions.  

## 2019-10-05 ENCOUNTER — Ambulatory Visit (HOSPITAL_BASED_OUTPATIENT_CLINIC_OR_DEPARTMENT_OTHER)
Admission: RE | Admit: 2019-10-05 | Discharge: 2019-10-05 | Disposition: A | Discharge status: Home / Self Care | Payer: BC Managed Care – PPO | Attending: Surgery | Admitting: Surgery

## 2019-10-05 ENCOUNTER — Encounter (HOSPITAL_BASED_OUTPATIENT_CLINIC_OR_DEPARTMENT_OTHER)
Admission: RE | Disposition: A | Discharge status: Home / Self Care | Payer: Self-pay | Source: Home / Self Care | Attending: Surgery

## 2019-10-05 ENCOUNTER — Encounter (HOSPITAL_BASED_OUTPATIENT_CLINIC_OR_DEPARTMENT_OTHER): Payer: Self-pay | Admitting: Surgery

## 2019-10-05 ENCOUNTER — Other Ambulatory Visit: Payer: Self-pay

## 2019-10-05 ENCOUNTER — Ambulatory Visit
Admission: RE | Admit: 2019-10-05 | Discharge: 2019-10-05 | Disposition: A | Discharge status: Home / Self Care | Payer: BC Managed Care – PPO | Source: Ambulatory Visit | Attending: Surgery | Admitting: Surgery

## 2019-10-05 ENCOUNTER — Ambulatory Visit (HOSPITAL_BASED_OUTPATIENT_CLINIC_OR_DEPARTMENT_OTHER): Payer: BC Managed Care – PPO | Admitting: Anesthesiology

## 2019-10-05 DIAGNOSIS — Z79899 Other long term (current) drug therapy: Secondary | ICD-10-CM | POA: Insufficient documentation

## 2019-10-05 DIAGNOSIS — Z885 Allergy status to narcotic agent status: Secondary | ICD-10-CM | POA: Insufficient documentation

## 2019-10-05 DIAGNOSIS — F419 Anxiety disorder, unspecified: Secondary | ICD-10-CM | POA: Diagnosis not present

## 2019-10-05 DIAGNOSIS — K219 Gastro-esophageal reflux disease without esophagitis: Secondary | ICD-10-CM | POA: Diagnosis not present

## 2019-10-05 DIAGNOSIS — N6091 Unspecified benign mammary dysplasia of right breast: Secondary | ICD-10-CM | POA: Insufficient documentation

## 2019-10-05 DIAGNOSIS — Z803 Family history of malignant neoplasm of breast: Secondary | ICD-10-CM | POA: Diagnosis not present

## 2019-10-05 DIAGNOSIS — Z881 Allergy status to other antibiotic agents status: Secondary | ICD-10-CM | POA: Insufficient documentation

## 2019-10-05 DIAGNOSIS — K5909 Other constipation: Secondary | ICD-10-CM | POA: Diagnosis not present

## 2019-10-05 DIAGNOSIS — Z8041 Family history of malignant neoplasm of ovary: Secondary | ICD-10-CM | POA: Diagnosis not present

## 2019-10-05 DIAGNOSIS — N6310 Unspecified lump in the right breast, unspecified quadrant: Secondary | ICD-10-CM | POA: Diagnosis not present

## 2019-10-05 DIAGNOSIS — N6011 Diffuse cystic mastopathy of right breast: Secondary | ICD-10-CM | POA: Diagnosis not present

## 2019-10-05 DIAGNOSIS — N6081 Other benign mammary dysplasias of right breast: Secondary | ICD-10-CM

## 2019-10-05 HISTORY — DX: Depression, unspecified: F32.A

## 2019-10-05 HISTORY — PX: BREAST LUMPECTOMY WITH RADIOACTIVE SEED LOCALIZATION: SHX6424

## 2019-10-05 IMAGING — MG MM BREAST SURGICAL SPECIMEN
1 series · 2 of 2 positions shown · non-contrast
Comparison: Previous exam(s).

CLINICAL DATA: Excisional biopsy of biopsy-proven flat epithelial
atypia involving the UPPER OUTER QUADRANT of the RIGHT breast at
MIDDLE depth, associated with a coil shaped tissue marker clip.
Radioactive seed localization was performed yesterday in
anticipation of today's excisional biopsy.

EXAM:
SPECIMEN RADIOGRAPH OF THE RIGHT BREAST

[Series 2: R · right · 0.07mm/px · 2 of 2 slices shown]
[im 1/2]
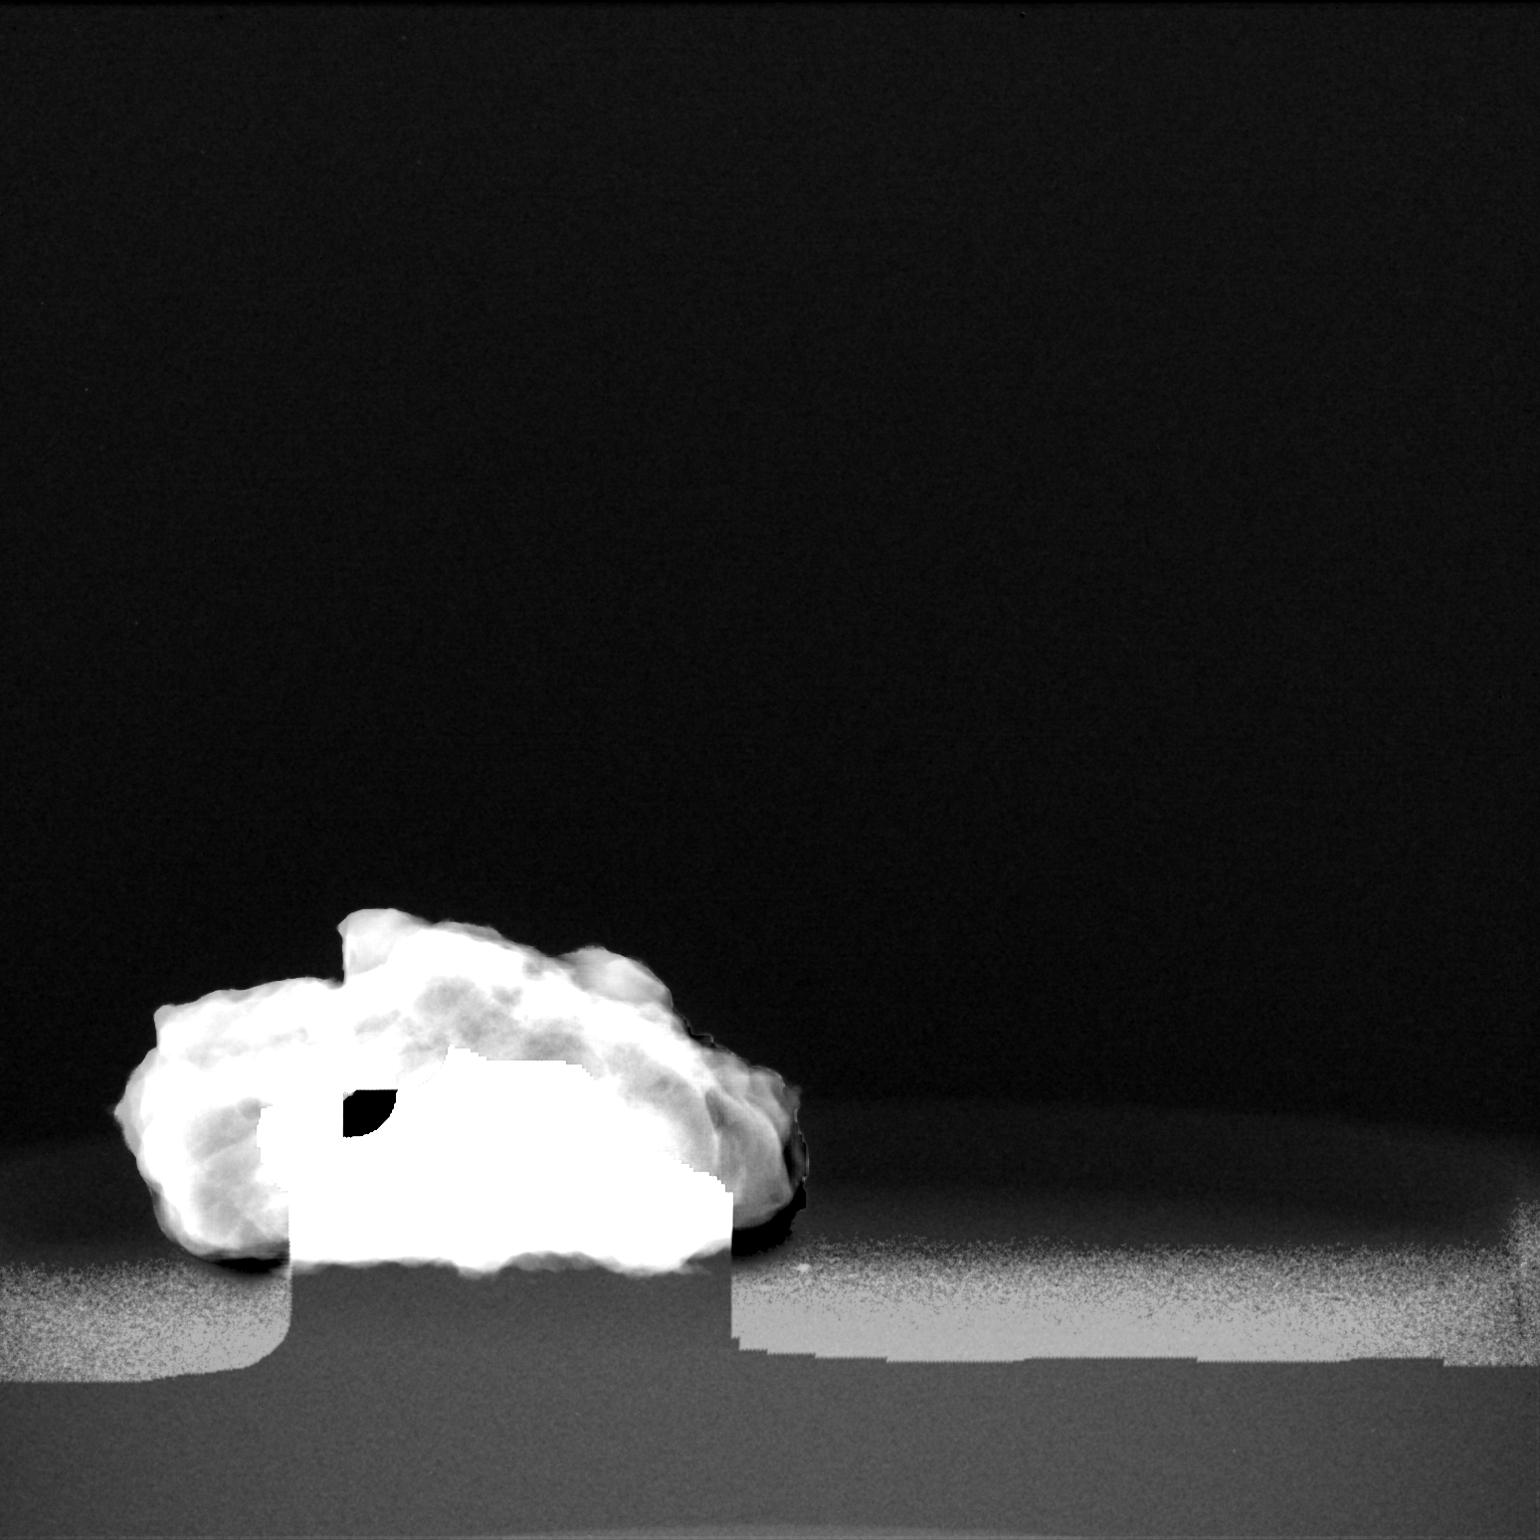
[im 2/2]
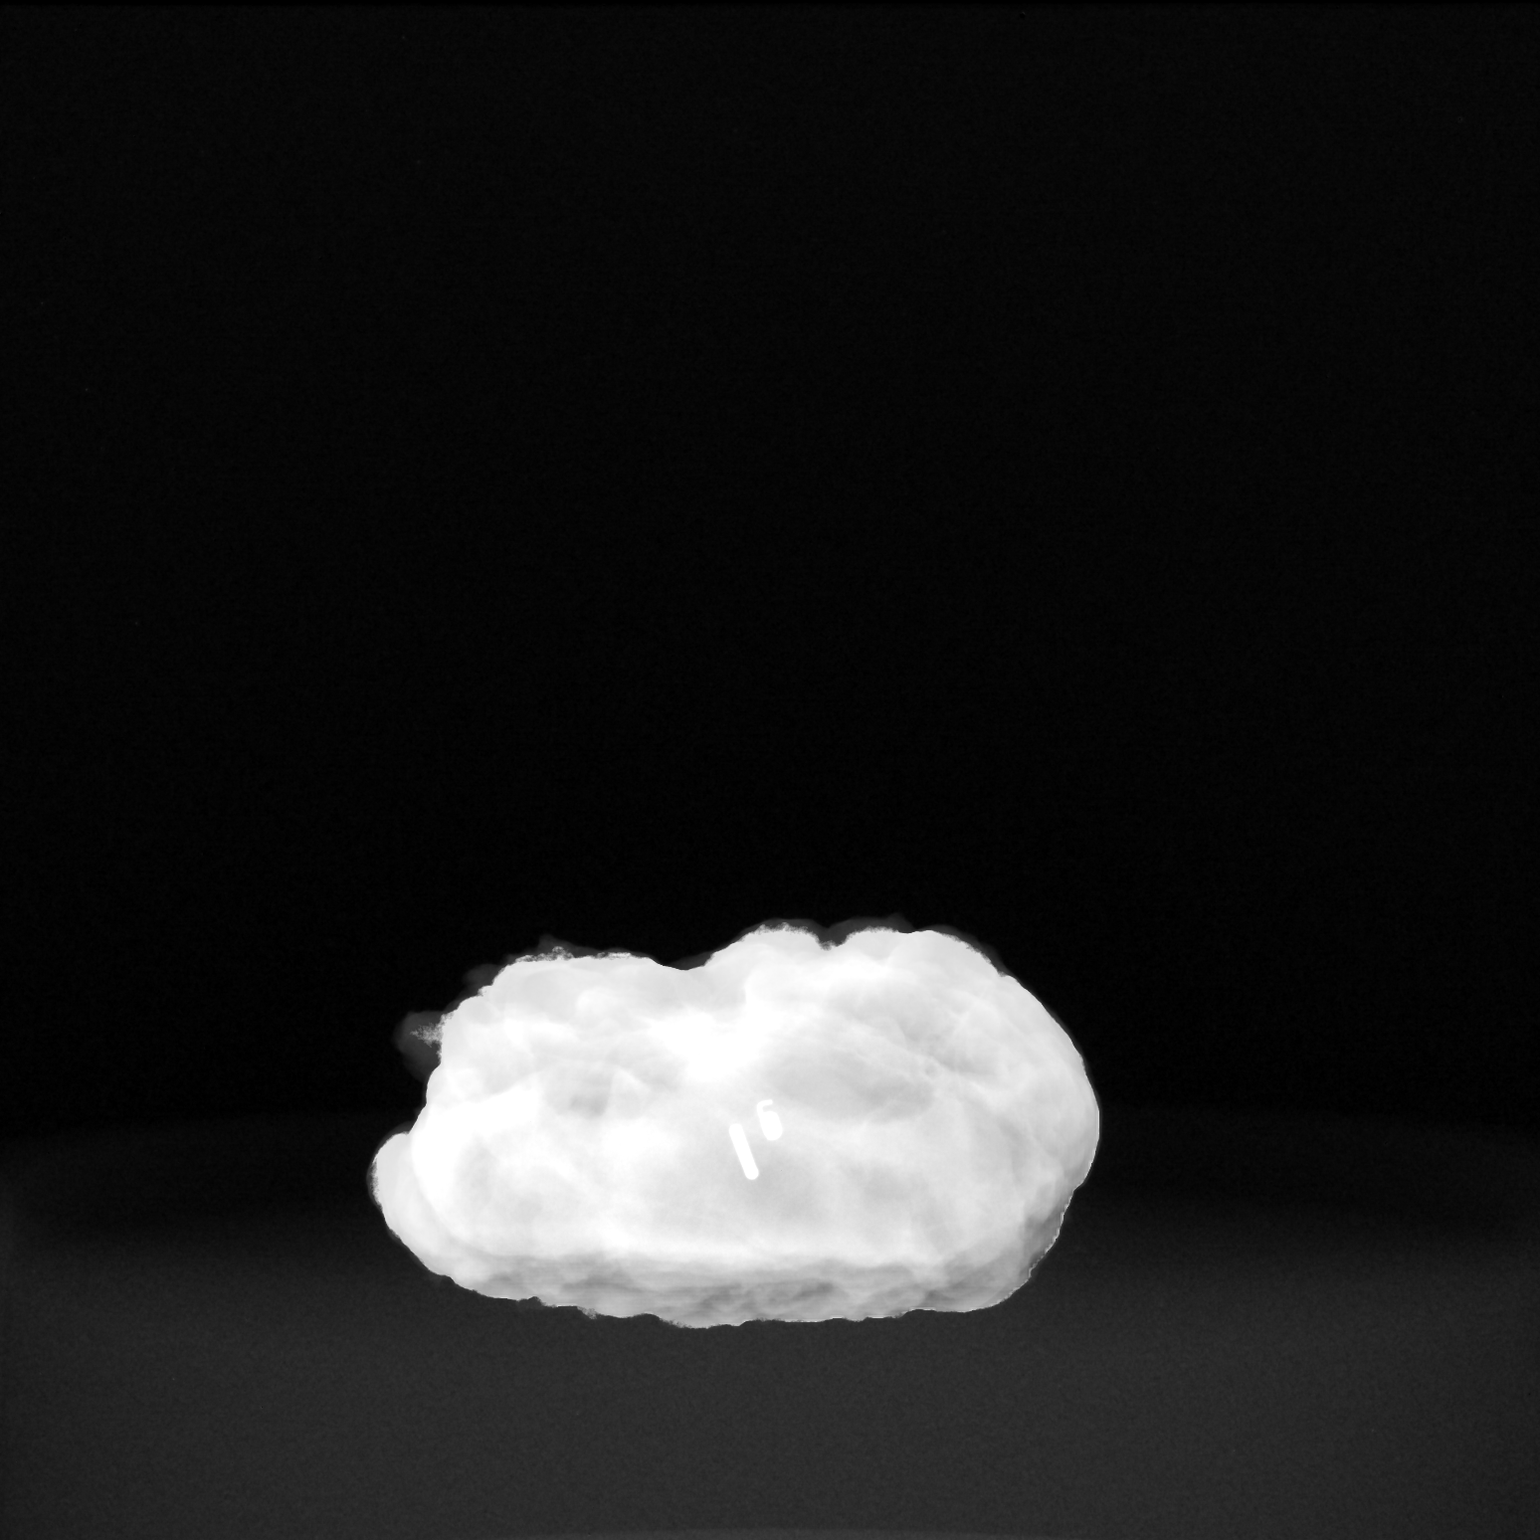

[2 of 2 positions shown; findings below may reference images not displayed]

FINDINGS: Status post excision of the RIGHT breast. The radioactive seed and
the coil shaped tissue marker clip are present in the non compressed
specimen. The radioactive seed is intact. This was discussed by
telephone with the operating room nurse at the time of
interpretation on [DATE] at 1 o'clock p.m.
IMPRESSION: Specimen radiograph of the RIGHT breast.

## 2019-10-05 SURGERY — BREAST LUMPECTOMY WITH RADIOACTIVE SEED LOCALIZATION
Anesthesia: General | Site: Breast | Laterality: Right

## 2019-10-05 MED ORDER — BUPIVACAINE HCL (PF) 0.25 % IJ SOLN
INTRAMUSCULAR | Status: DC | PRN
  Administered 2019-10-05: 10 mL

## 2019-10-05 MED ORDER — LIDOCAINE 2% (20 MG/ML) 5 ML SYRINGE
INTRAMUSCULAR | Status: AC
  Filled 2019-10-05: qty 10

## 2019-10-05 MED ORDER — PROMETHAZINE HCL 25 MG/ML IJ SOLN: 6.2500 mg | INTRAMUSCULAR | Status: DC | PRN

## 2019-10-05 MED ORDER — SCOPOLAMINE 1 MG/3DAYS TD PT72
MEDICATED_PATCH | TRANSDERMAL | Status: AC
  Filled 2019-10-05: qty 1

## 2019-10-05 MED ORDER — MIDAZOLAM HCL 2 MG/2ML IJ SOLN
INTRAMUSCULAR | Status: AC
  Filled 2019-10-05: qty 2

## 2019-10-05 MED ORDER — PROPOFOL 10 MG/ML IV BOLUS
INTRAVENOUS | Status: DC | PRN
  Administered 2019-10-05: 200 mg via INTRAVENOUS

## 2019-10-05 MED ORDER — ACETAMINOPHEN 500 MG PO TABS
1000.0000 mg | ORAL_TABLET | Freq: Once | ORAL | Status: AC
  Administered 2019-10-05: 1000 mg via ORAL

## 2019-10-05 MED ORDER — LIDOCAINE 2% (20 MG/ML) 5 ML SYRINGE
INTRAMUSCULAR | Status: DC | PRN
  Administered 2019-10-05: 100 mg via INTRAVENOUS

## 2019-10-05 MED ORDER — CELECOXIB 200 MG PO CAPS
ORAL_CAPSULE | ORAL | Status: AC
  Filled 2019-10-05: qty 1

## 2019-10-05 MED ORDER — CELECOXIB 200 MG PO CAPS
200.0000 mg | ORAL_CAPSULE | Freq: Once | ORAL | Status: AC
  Administered 2019-10-05: 200 mg via ORAL

## 2019-10-05 MED ORDER — FENTANYL CITRATE (PF) 100 MCG/2ML IJ SOLN
INTRAMUSCULAR | Status: AC
  Filled 2019-10-05: qty 2

## 2019-10-05 MED ORDER — CHLORHEXIDINE GLUCONATE CLOTH 2 % EX PADS: 6.0000 | MEDICATED_PAD | Freq: Once | CUTANEOUS | Status: DC

## 2019-10-05 MED ORDER — LACTATED RINGERS IV SOLN: INTRAVENOUS | Status: DC

## 2019-10-05 MED ORDER — CEFAZOLIN SODIUM-DEXTROSE 2-4 GM/100ML-% IV SOLN
3.0000 g | INTRAVENOUS | Status: AC
  Administered 2019-10-05: 2 g via INTRAVENOUS

## 2019-10-05 MED ORDER — ACETAMINOPHEN 500 MG PO TABS
ORAL_TABLET | ORAL | Status: AC
  Filled 2019-10-05: qty 2

## 2019-10-05 MED ORDER — HYDROCODONE-ACETAMINOPHEN 5-325 MG PO TABS: 1.0000 | ORAL_TABLET | Freq: Four times a day (QID) | ORAL | 0 refills | Status: DC | PRN

## 2019-10-05 MED ORDER — FENTANYL CITRATE (PF) 100 MCG/2ML IJ SOLN
INTRAMUSCULAR | Status: DC | PRN
Start: 2019-10-05 — End: 2019-10-05
  Administered 2019-10-05 (×4): 50 ug via INTRAVENOUS

## 2019-10-05 MED ORDER — DEXAMETHASONE SODIUM PHOSPHATE 10 MG/ML IJ SOLN
INTRAMUSCULAR | Status: DC | PRN
  Administered 2019-10-05: 5 mg via INTRAVENOUS

## 2019-10-05 MED ORDER — KETOROLAC TROMETHAMINE 30 MG/ML IJ SOLN
INTRAMUSCULAR | Status: DC | PRN
  Administered 2019-10-05: 30 mg via INTRAVENOUS

## 2019-10-05 MED ORDER — MIDAZOLAM HCL 2 MG/2ML IJ SOLN
INTRAMUSCULAR | Status: DC | PRN
  Administered 2019-10-05: 2 mg via INTRAVENOUS

## 2019-10-05 MED ORDER — ONDANSETRON HCL 4 MG/2ML IJ SOLN
INTRAMUSCULAR | Status: DC | PRN
  Administered 2019-10-05: 4 mg via INTRAVENOUS

## 2019-10-05 MED ORDER — IBUPROFEN 800 MG PO TABS: 800.0000 mg | ORAL_TABLET | Freq: Three times a day (TID) | ORAL | 0 refills | Status: DC | PRN

## 2019-10-05 MED ORDER — FENTANYL CITRATE (PF) 100 MCG/2ML IJ SOLN
25.0000 ug | INTRAMUSCULAR | Status: DC | PRN
  Administered 2019-10-05: 50 ug via INTRAVENOUS

## 2019-10-05 MED ORDER — CEFAZOLIN SODIUM-DEXTROSE 2-4 GM/100ML-% IV SOLN
INTRAVENOUS | Status: AC
  Filled 2019-10-05: qty 100

## 2019-10-05 MED ORDER — SCOPOLAMINE 1 MG/3DAYS TD PT72
1.0000 | MEDICATED_PATCH | TRANSDERMAL | Status: DC
  Administered 2019-10-05: 1.5 mg via TRANSDERMAL

## 2019-10-05 SURGICAL SUPPLY — 50 items
ADH SKN CLS APL DERMABOND .7 (GAUZE/BANDAGES/DRESSINGS) ×1
APL PRP STRL LF DISP 70% ISPRP (MISCELLANEOUS) ×1
APPLIER CLIP 9.375 MED OPEN (MISCELLANEOUS)
APR CLP MED 9.3 20 MLT OPN (MISCELLANEOUS)
BINDER BREAST LRG (GAUZE/BANDAGES/DRESSINGS) IMPLANT
BINDER BREAST MEDIUM (GAUZE/BANDAGES/DRESSINGS) IMPLANT
BINDER BREAST XLRG (GAUZE/BANDAGES/DRESSINGS) ×1 IMPLANT
BINDER BREAST XXLRG (GAUZE/BANDAGES/DRESSINGS) IMPLANT
BLADE SURG 15 STRL LF DISP TIS (BLADE) ×1 IMPLANT
BLADE SURG 15 STRL SS (BLADE) ×2
CANISTER SUC SOCK COL 7IN (MISCELLANEOUS) IMPLANT
CANISTER SUCT 1200ML W/VALVE (MISCELLANEOUS) IMPLANT
CHLORAPREP W/TINT 26 (MISCELLANEOUS) ×2 IMPLANT
CLIP APPLIE 9.375 MED OPEN (MISCELLANEOUS) IMPLANT
COVER BACK TABLE 60X90IN (DRAPES) ×2 IMPLANT
COVER MAYO STAND STRL (DRAPES) ×2 IMPLANT
COVER PROBE W GEL 5X96 (DRAPES) ×2 IMPLANT
COVER WAND RF STERILE (DRAPES) IMPLANT
DECANTER SPIKE VIAL GLASS SM (MISCELLANEOUS) IMPLANT
DERMABOND ADVANCED (GAUZE/BANDAGES/DRESSINGS) ×1
DERMABOND ADVANCED .7 DNX12 (GAUZE/BANDAGES/DRESSINGS) ×1 IMPLANT
DRAPE LAPAROSCOPIC ABDOMINAL (DRAPES) IMPLANT
DRAPE LAPAROTOMY 100X72 PEDS (DRAPES) ×2 IMPLANT
DRAPE UTILITY XL STRL (DRAPES) ×2 IMPLANT
ELECT COATED BLADE 2.86 ST (ELECTRODE) ×2 IMPLANT
ELECT REM PT RETURN 9FT ADLT (ELECTROSURGICAL) ×2
ELECTRODE REM PT RTRN 9FT ADLT (ELECTROSURGICAL) ×1 IMPLANT
GLOVE BIOGEL PI IND STRL 8 (GLOVE) ×1 IMPLANT
GLOVE BIOGEL PI INDICATOR 8 (GLOVE) ×1
GLOVE ECLIPSE 8.0 STRL XLNG CF (GLOVE) ×2 IMPLANT
GOWN STRL REUS W/ TWL LRG LVL3 (GOWN DISPOSABLE) ×2 IMPLANT
GOWN STRL REUS W/TWL LRG LVL3 (GOWN DISPOSABLE) ×4
HEMOSTAT ARISTA ABSORB 3G PWDR (HEMOSTASIS) IMPLANT
HEMOSTAT SNOW SURGICEL 2X4 (HEMOSTASIS) IMPLANT
KIT MARKER MARGIN INK (KITS) ×2 IMPLANT
NDL HYPO 25X1 1.5 SAFETY (NEEDLE) ×1 IMPLANT
NEEDLE HYPO 25X1 1.5 SAFETY (NEEDLE) ×2 IMPLANT
NS IRRIG 1000ML POUR BTL (IV SOLUTION) ×2 IMPLANT
PACK BASIN DAY SURGERY FS (CUSTOM PROCEDURE TRAY) ×2 IMPLANT
PENCIL SMOKE EVACUATOR (MISCELLANEOUS) ×2 IMPLANT
SLEEVE SCD COMPRESS KNEE MED (MISCELLANEOUS) ×2 IMPLANT
SPONGE LAP 4X18 RFD (DISPOSABLE) ×2 IMPLANT
SUT MNCRL AB 4-0 PS2 18 (SUTURE) ×2 IMPLANT
SUT SILK 2 0 SH (SUTURE) IMPLANT
SUT VICRYL 3-0 CR8 SH (SUTURE) ×2 IMPLANT
SYR CONTROL 10ML LL (SYRINGE) ×2 IMPLANT
TOWEL GREEN STERILE FF (TOWEL DISPOSABLE) ×2 IMPLANT
TRAY FAXITRON CT DISP (TRAY / TRAY PROCEDURE) ×2 IMPLANT
TUBE CONNECTING 20X1/4 (TUBING) IMPLANT
YANKAUER SUCT BULB TIP NO VENT (SUCTIONS) IMPLANT

## 2019-10-05 NOTE — Transfer of Care (Signed)
Immediate Anesthesia Transfer of Care Note  Patient: Julia [name]  Procedure(s) Performed: RIGHT BREAST LUMPECTOMY WITH RADIOACTIVE SEED LOCALIZATION (Right Breast)  Patient Location: PACU  Anesthesia Type:General  Level of Consciousness: awake, alert  and oriented  Airway & Oxygen Therapy: Patient Spontanous Breathing and Patient connected to face mask oxygen  Post-op Assessment: Report given to RN and Post -op Vital signs reviewed and stable  Post vital signs: Reviewed and stable  Last Vitals:  Vitals Value Taken Time  BP 115/79 10/05/19 1316  Temp    Pulse 64 10/05/19 1319  Resp 14 10/05/19 1319  SpO2 100 % 10/05/19 1319  Vitals shown include unvalidated device data.  Last Pain:  Vitals:   10/05/19 1119  TempSrc: Oral  PainSc: 0-No pain         Complications: No complications documented.

## 2019-10-05 NOTE — Op Note (Signed)
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  Core biopsy showed atypical flat atypia.  Pt had a strong family history of breast cancer.  The procedure has been discussed with the patient. Alternatives to surgery have been discussed with the patient.  Risks of surgery include bleeding,  Infection,  Seroma formation, death,  and the need for further surgery.   The patient understands and wishes to proceed.  The procedure has been discussed with the patient. Alternatives to surgery have been discussed with the patient.  Risks of surgery include bleeding,  Infection,  Seroma formation, death,  and the need for further surgery.   Neoprobe used to identify seed in breast.  The patient understands and wishes to proceed.     Description of procedure: Patient was met in the holding area and questions were answered.  Neoprobe used to verify seed location right breast upper outer quadrant .  Procedure reviewed all questions answered again.  Complications reviewed.  Patient then taken back to operating.  She is placed supine upon the OR table.  After induction of LMA anesthesia, breast was prepped and draped in sterile fashion and timeout performed.  Proper patient, site and procedure verified.  Neoprobe identified seed in the breast.  Curvilinear incision made right breast upper outer quadrant and  dissection carried down into the subcutaneous tissues.  All tissue and the seed/ clip were excised with a grossly negative margin.  Hemostasis achieved with cautery.  Faxitron revealed seed and clip to be in the  specimen.  This was oriented with ink and sent to pathology.  Wound irrigated and local anesthetic infiltrated.  Hemostasis achieved wound closed with 3-0 Vicryl for Monocryl.  Dermabond applied.  All counts were found to be correct.  Breast binder placed.  The patient was awoke extubated taken to recovery in satisfactory condition.  I was personally present during the key and critical portions of this procedure and immediately available throughout the entire procedure, as documented in my operative note.

## 2019-10-05 NOTE — Discharge Instructions (Signed)
No Tylenol before 5:30pm today. No NSAIDS (Ibuprofen, Aleve, etc,) before 7pm today.  [facility] Office Phone Number [phone]  BREAST BIOPSY/ PARTIAL MASTECTOMY: POST OP INSTRUCTIONS  Always review your discharge instruction sheet given to you by the facility where your surgery was performed.  IF YOU HAVE DISABILITY OR FAMILY LEAVE FORMS, YOU MUST BRING THEM TO THE OFFICE FOR PROCESSING.  DO NOT GIVE THEM TO YOUR DOCTOR.  1. A prescription for pain medication may be given to you upon discharge.  Take your pain medication as prescribed, if needed.  If narcotic pain medicine is not needed, then you may take acetaminophen (Tylenol) or ibuprofen (Advil) as needed. 2. Take your usually prescribed medications unless otherwise directed 3. If you need a refill on your pain medication, please contact your pharmacy.  They will contact our office to request authorization.  Prescriptions will not be filled after 5pm or on week-ends. 4. You should eat very light the first 24 hours after surgery, such as soup, crackers, pudding, etc.  Resume your normal diet the day after surgery. 5. Most patients will experience some swelling and bruising in the breast.  Ice packs and a good support bra will help.  Swelling and bruising can take several days to resolve.  6. It is common to experience some constipation if taking pain medication after surgery.  Increasing fluid intake and taking a stool softener will usually help or prevent this problem from occurring.  A mild laxative (Milk of Magnesia or Miralax) should be taken according to package directions if there are no bowel movements after 48 hours. 7. Unless discharge instructions indicate otherwise, you may remove your bandages 24-48 hours after surgery, and you may shower at that time.  You may have steri-strips (small skin tapes) in place directly over the incision.  These strips should be left on the skin for 7-10 days.  If your surgeon used  skin glue on the incision, you may shower in 24 hours.  The glue will flake off over the next 2-3 weeks.  Any sutures or staples will be removed at the office during your follow-up visit. 8. ACTIVITIES:  You may resume regular daily activities (gradually increasing) beginning the next day.  Wearing a good support bra or sports bra minimizes pain and swelling.  You may have sexual intercourse when it is comfortable. a. You may drive when you no longer are taking prescription pain medication, you can comfortably wear a seatbelt, and you can safely maneuver your car and apply brakes. b. RETURN TO WORK:  ______________________________________________________________________________________ 9. You should see your doctor in the office for a follow-up appointment approximately two weeks after your surgery.  Your doctor's nurse will typically make your follow-up appointment when she calls you with your pathology report.  Expect your pathology report 2-3 business days after your surgery.  You may call to check if you do not hear from [place] after three days. 10. OTHER INSTRUCTIONS: _______________________________________________________________________________________________ _____________________________________________________________________________________________________________________________________ _____________________________________________________________________________________________________________________________________ _____________________________________________________________________________________________________________________________________  WHEN TO CALL YOUR DOCTOR: 1. Fever over 101.0 2. Nausea and/or vomiting. 3. Extreme swelling or bruising. 4. Continued bleeding from incision. 5. Increased pain, redness, or drainage from the incision.  The clinic staff is available to answer your questions during regular business hours.  Please don't hesitate to call and ask to speak to one of  the nurses for clinical concerns.  If you have a medical emergency, go to the nearest emergency room or call 911.  A surgeon from [facility] is  always on call at the hospital.  For further questions, please visit [email or web address]    Post Anesthesia Home Care Instructions  Activity: Get plenty of rest for the remainder of the day. A responsible individual must stay with you for 24 hours following the procedure.  For the next 24 hours, DO NOT: -Drive a car -Paediatric nurse -Drink alcoholic beverages -Take any medication unless instructed by your physician -Make any legal decisions or sign important papers.  Meals: Start with liquid foods such as gelatin or soup. Progress to regular foods as tolerated. Avoid greasy, spicy, heavy foods. If nausea and/or vomiting occur, drink only clear liquids until the nausea and/or vomiting subsides. Call your physician if vomiting continues.  Special Instructions/Symptoms: Your throat may feel dry or sore from the anesthesia or the breathing tube placed in your throat during surgery. If this causes discomfort, gargle with warm salt water. The discomfort should disappear within 24 hours.  If you had a scopolamine patch placed behind your ear for the management of post- operative nausea and/or vomiting:  1. The medication in the patch is effective for 72 hours, after which it should be removed.  Wrap patch in a tissue and discard in the trash. Wash hands thoroughly with soap and water. 2. You may remove the patch earlier than 72 hours if you experience unpleasant side effects which may include dry mouth, dizziness or visual disturbances. 3. Avoid touching the patch. Wash your hands with soap and water after contact with the patch.

## 2019-10-05 NOTE — H&P (Signed)
Cindie Laroche Location: [facility] Patient #: [id number] DOB: [birth year] Married / Language: English / Race: White Female  History of Present Illness  Patient words: Patient sent at the request of the breast Center of [facility] Dr. Judyann Munson for abnormal screening mammogram. She is noted to have 2 clusters of atypical right breast microcalcifications. One was in the periareolar region and the second was in the right upper outer quadrant. The right upper quadrant biopsies showed flat atypia while the other showed fibrocystic change with microcalcifications. She denies any history of breast pain, nipple discharge or breast mass. She states her paternal aunts had breast cancer in her 16s as well as a other female GYN malignancy. The risks for family history is unknown. She has no specific complaints today and did well with her biopsy.     CLINICAL DATA: Patient was called back from screening mammogram for right breast calcifications.  EXAM: DIGITAL DIAGNOSTIC RIGHT MAMMOGRAM WITH CAD  COMPARISON: Baseline screening mammogram dated 04/23/2019.  ACR Breast Density Category c: The breast tissue is heterogeneously dense, which may obscure small masses.  FINDINGS: Additional images of the right breast were performed. There are grouped coarse heterogeneous calcifications in the upper slightly outer quadrant of the right breast spanning an area of 7 mm. There is a second group of coarse heterogeneous calcifications spanning 4 mm in the upper-outer quadrant of the right breast.  Mammographic images were processed with CAD.  IMPRESSION: Two areas of indeterminate calcifications in the upper-outer quadrant of the right breast.  RECOMMENDATION: Stereotactic biopsy of the 2 groups of calcifications in the upper-outer quadrant of the right breast is recommended. The biopsies will be scheduled at the patient's convenience.  I have discussed the findings and  recommendations with the patient. If applicable, a reminder letter will be sent to the patient regarding the next appointment.  BI-RADS CATEGORY 4: Suspicious.   Electronically Signed By: Baird Lyons M.D. On: 06/28/2019 16:18      Diagnosis 1. Breast, right, needle core biopsy, lateral periareolar, x clip - FIBROCYSTIC CHANGE WITH CALCIFICATIONS. PSEUDOANGIOMATOUS STROMAL HYPERPLASIA. 2. Breast, right, needle core biopsy, upper outer middle depth, coil clip - FLAT EPITHELIAL ATYPIA WITH CALCIFICATIONS.  The patient is a 44 year old female.   Past Surgical History (Chanel Lonni Fix, CMA; 08/01/2019 11:09 AM) Oral Surgery Tonsillectomy  Diagnostic Studies History  Mammogram within last year Pap Smear 1-5 years ago  Allergies (Chanel Lonni Fix, CMA; 08/01/2019 11:10 AM) Ciprofloxacin *CHEMICALS* Percocet *ANALGESICS - OPIOID* Allergies Reconciled  Medication History Adderall XR (25MG  Capsule ER 24HR, Oral) Active. Xanax (1MG  Tablet, Oral) Active. Amphetamine-Dextroamphetamine (30MG  Tablet, Oral) Active. Pantoprazole Sodium (40MG  Tablet DR, Oral) Active. Topiramate (100MG  Tablet, Oral) Active. buPROPion HCl ER (XL) (300MG  Tablet ER 24HR, Oral) Active. FLUoxetine HCl (20MG  Capsule, Oral) Active. Trulance (3MG  Tablet, Oral) Active. Medications Reconciled  Social History Alcohol use Occasional alcohol use. Caffeine use Carbonated beverages. No drug use Tobacco use Never smoker.  Family History Alcohol Abuse Brother, Family Members In General. Breast Cancer Family Members In General. Cancer Family Members In General, Father. Cerebrovascular Accident Father. Colon Polyps Father. Depression Brother, Father, Mother. Diabetes Mellitus Family Members In General. Heart Disease Father. Hypertension Father. Melanoma Family Members In General. Ovarian Cancer Family Members In General. Seizure disorder Daughter.  Pregnancy /  Birth History ( Age at menarche 15 years. Contraceptive History Oral contraceptives. Gravida 3 Length (months) of breastfeeding 7-12 Maternal age 25-25 Para 3 Regular periods  Other Problems ( Anxiety Disorder Back Pain Depression  Gastroesophageal Reflux Disease Lump In Breast Other disease, cancer, significant illness Seizure Disorder     Review of Systems (Chanel Nolan CMA; 08/01/2019 11:09 AM) General Not Present- Appetite Loss, Chills, Fatigue, Fever, Night Sweats, Weight Gain and Weight Loss. Skin Not Present- Change in Wart/Mole, Dryness, Hives, Jaundice, New Lesions, Non-Healing Wounds, Rash and Ulcer. HEENT Present- Seasonal Allergies. Not Present- Earache, Hearing Loss, Hoarseness, Nose Bleed, Oral Ulcers, Ringing in the Ears, Sinus Pain, Sore Throat, Visual Disturbances, Wears glasses/contact lenses and Yellow Eyes. Respiratory Not Present- Bloody sputum, Chronic Cough, Difficulty Breathing, Snoring and Wheezing. Breast Present- Breast Mass. Not Present- Breast Pain, Nipple Discharge and Skin Changes. Cardiovascular Present- Leg Cramps. Not Present- Chest Pain, Difficulty Breathing Lying Down, Palpitations, Rapid Heart Rate, Shortness of Breath and Swelling of Extremities. Gastrointestinal Present- Abdominal Pain, Bloating, Constipation, Excessive gas, Hemorrhoids and Nausea. Not Present- Bloody Stool, Change in Bowel Habits, Chronic diarrhea, Difficulty Swallowing, Gets full quickly at meals, Indigestion, Rectal Pain and Vomiting. Female Genitourinary Present- Urgency. Not Present- Frequency, Nocturia, Painful Urination and Pelvic Pain. Musculoskeletal Present- Back Pain. Not Present- Joint Pain, Joint Stiffness, Muscle Pain, Muscle Weakness and Swelling of Extremities. Neurological Present- Seizures. Not Present- Decreased Memory, Fainting, Headaches, Numbness, Tingling, Tremor, Trouble walking and Weakness. Psychiatric Present- Anxiety and Depression. Not  Present- Bipolar, Change in Sleep Pattern, Fearful and Frequent crying. Endocrine Not Present- Cold Intolerance, Excessive Hunger, Hair Changes, Heat Intolerance, Hot flashes and New Diabetes. Hematology Present- Easy Bruising. Not Present- Blood Thinners, Excessive bleeding, Gland problems, HIV and Persistent Infections.  Vitals  08/01/2019 11:11 AM Weight: 204 lb Height: 73in Body Surface Area: 2.17 m Body Mass Index: 26.91 kg/m  Temp.: 97.69F  Pulse: 107 (Regular)         Physical Exam   General Mental Status-Alert. General Appearance-Consistent with stated age. Hydration-Well hydrated. Voice-Normal.  Head and Neck Head-normocephalic, atraumatic with no lesions or palpable masses. Trachea-midline. Thyroid Gland Characteristics - normal size and consistency.  Chest and Lung Exam Chest and lung exam reveals -quiet, even and easy respiratory effort with no use of accessory muscles and on auscultation, normal breath sounds, no adventitious sounds and normal vocal resonance. Inspection Chest Wall - Normal. Back - normal.  Breast Breast - Left-Symmetric, Non Tender, No Biopsy scars, no Dimpling - Left, No Inflammation, No Lumpectomy scars, No Mastectomy scars, No Peau d' Orange. Breast - Right-Symmetric, Non Tender, No Biopsy scars, no Dimpling - Right, No Inflammation, No Lumpectomy scars, No Mastectomy scars, No Peau d' Orange. Breast Lump-No Palpable Breast Mass.  Cardiovascular Cardiovascular examination reveals -normal heart sounds, regular rate and rhythm with no murmurs and normal pedal pulses bilaterally.  Neurologic Neurologic evaluation reveals -alert and oriented x 3 with no impairment of recent or remote memory. Mental Status-Normal.  Neuropsychiatric The patient's mood and affect are described as -normal. Associations-intact. Judgment and Insight-insight is appropriate concerning matters relevant to  self.  Musculoskeletal Normal Exam - Left-Upper Extremity Strength Normal and Lower Extremity Strength Normal. Normal Exam - Right-Upper Extremity Strength Normal and Lower Extremity Strength Normal.  Lymphatic Head & Neck  General Head & Neck Lymphatics: Bilateral - Description - Normal. Axillary  General Axillary Region: Bilateral - Description - Normal. Tenderness - Non Tender.    Assessment & Plan (  ATYPICAL DUCTAL HYPERPLASIA OF RIGHT BREAST (N60.91) Impression: Flat atypia noted on biopsy. This is in the right upper quadrant with the calcifications. Recommend right breast lumpectomy with seed localization. The other area is benign and can be followed.  Risk of lumpectomy include bleeding, infection, seroma, more surgery, use of seed/wire, wound care, cosmetic deformity and the need for other treatments, death , blood clots, death. Pt agrees to proceed.  Total time 45 minutes discussing surgery, genetic counseling, complications of surgery, documentation, and face-to-face time  Current Plans You are being scheduled for surgery- Our schedulers will call you.  You should hear from our office's scheduling department within 5 working days about the location, date, and time of surgery. We try to make accommodations for patient's preferences in scheduling surgery, but sometimes the OR schedule or the surgeon's schedule prevents [place] from making those accommodations.  If you have not heard from our office [phone]) in 5 working days, call the office and ask for your surgeon's nurse.  If you have other questions about your diagnosis, plan, or surgery, call the office and ask for your surgeon's nurse.  Pt Education - [facility] Breast Biopsy HCI: discussed with patient and provided information.  FAMILY HISTORY OF BREAST CANCER (Z80.3) Impression: Genetics   Lifetime risk over 20% therefore considered high risk based upon [employer] MODEL

## 2019-10-05 NOTE — Anesthesia Procedure Notes (Signed)
Procedure Name: LMA Insertion Date/Time: 10/05/2019 12:28 PM Performed by: [place], Dorris Vangorder C, CRNA Pre-anesthesia Checklist: Patient identified, Emergency Drugs available, Suction available and Patient being monitored Patient Re-evaluated:Patient Re-evaluated prior to induction Oxygen Delivery Method: Circle system utilized Preoxygenation: Pre-oxygenation with 100% oxygen Induction Type: IV induction Ventilation: Mask ventilation without difficulty LMA: LMA inserted LMA Size: 4.0 Number of attempts: 1 Airway Equipment and Method: Bite block Placement Confirmation: positive ETCO2 Tube secured with: Tape Dental Injury: Teeth and Oropharynx as per pre-operative assessment

## 2019-10-05 NOTE — Anesthesia Preprocedure Evaluation (Addendum)
Anesthesia Evaluation  Patient identified by MRN, date of birth, ID band Patient awake    Reviewed: Allergy & Precautions, NPO status , Patient's Chart, lab work & pertinent test results  History of Anesthesia Complications Negative for: history of anesthetic complications  Airway Mallampati: II  TM Distance: >3 FB Neck ROM: Full    Dental no notable dental hx. (+) Dental Advisory Given   Pulmonary neg pulmonary ROS,    Pulmonary exam normal        Cardiovascular negative cardio ROS Normal cardiovascular exam     Neuro/Psych Seizures -, Well Controlled,  PSYCHIATRIC DISORDERS Anxiety Depression    GI/Hepatic Neg liver ROS, GERD  Medicated,  Endo/Other  negative endocrine ROS  Renal/GU negative Renal ROS     Musculoskeletal negative musculoskeletal ROS (+)   Abdominal   Peds  Hematology negative hematology ROS (+)   Anesthesia Other Findings   Reproductive/Obstetrics                            Anesthesia Physical Anesthesia Plan  ASA: II  Anesthesia Plan: General   Post-op Pain Management:    Induction: Intravenous  PONV Risk Score and Plan: 4 or greater and Ondansetron, Dexamethasone, Midazolam and Scopolamine patch - Pre-op  Airway Management Planned: LMA  Additional Equipment:   Intra-op Plan:   Post-operative Plan: Extubation in OR  Informed Consent: I have reviewed the patients History and Physical, chart, labs and discussed the procedure including the risks, benefits and alternatives for the proposed anesthesia with the patient or authorized representative who has indicated his/her understanding and acceptance.     Dental advisory given  Plan Discussed with: Anesthesiologist and CRNA  Anesthesia Plan Comments:        Anesthesia Quick Evaluation

## 2019-10-05 NOTE — Anesthesia Postprocedure Evaluation (Signed)
Anesthesia Post Note  Patient: Julia [name]  Procedure(s) Performed: RIGHT BREAST LUMPECTOMY WITH RADIOACTIVE SEED LOCALIZATION (Right Breast)     Patient location during evaluation: PACU Anesthesia Type: General Level of consciousness: sedated Pain management: pain level controlled Vital Signs Assessment: post-procedure vital signs reviewed and stable Respiratory status: spontaneous breathing and respiratory function stable Cardiovascular status: stable Postop Assessment: no apparent nausea or vomiting Anesthetic complications: no   No complications documented.  Last Vitals:  Vitals:   10/05/19 1350 10/05/19 1426  BP:  115/74  Pulse: 76 63  Resp: 18 16  Temp:  36.9 C  SpO2: 100% 100%    Last Pain:  Vitals:   10/05/19 1426  TempSrc:   PainSc: 3                  [name]

## 2019-10-05 NOTE — Progress Notes (Signed)
IV in left hand upon arrival to PACU. IV C/D/I and infusing.

## 2019-10-05 NOTE — Interval H&P Note (Signed)
History and Physical Interval Note:  10/05/2019 12:16 PM  Julia [name]  has presented today for surgery, with the diagnosis of RIGHT BREAST ATYPICAL DUCTAL HYPERPLASIA.  The various methods of treatment have been discussed with the patient and family. After consideration of risks, benefits and other options for treatment, the patient has consented to  Procedure(s): RIGHT BREAST LUMPECTOMY WITH RADIOACTIVE SEED LOCALIZATION (Right) as a surgical intervention.  The patient's history has been reviewed, patient examined, no change in status, stable for surgery.  I have reviewed the patient's chart and labs.  Questions were answered to the patient's satisfaction.     Dortha Schwalbe MD

## 2019-10-06 ENCOUNTER — Encounter (HOSPITAL_BASED_OUTPATIENT_CLINIC_OR_DEPARTMENT_OTHER): Payer: Self-pay | Admitting: Surgery

## 2019-10-09 ENCOUNTER — Other Ambulatory Visit: Payer: Self-pay | Admitting: Internal Medicine

## 2019-10-09 LAB — SURGICAL PATHOLOGY

## 2019-10-12 ENCOUNTER — Encounter (HOSPITAL_BASED_OUTPATIENT_CLINIC_OR_DEPARTMENT_OTHER): Payer: Self-pay | Admitting: Surgery

## 2019-10-12 DIAGNOSIS — F411 Generalized anxiety disorder: Secondary | ICD-10-CM | POA: Diagnosis not present

## 2019-10-12 NOTE — Addendum Note (Signed)
Addendum  created 10/12/19 1032 by Heather Roberts, MD   Intraprocedure Event deleted

## 2019-10-19 DIAGNOSIS — F411 Generalized anxiety disorder: Secondary | ICD-10-CM | POA: Diagnosis not present

## 2019-10-21 ENCOUNTER — Other Ambulatory Visit: Payer: Self-pay | Admitting: Internal Medicine

## 2019-10-26 DIAGNOSIS — F411 Generalized anxiety disorder: Secondary | ICD-10-CM | POA: Diagnosis not present

## 2019-11-16 DIAGNOSIS — F411 Generalized anxiety disorder: Secondary | ICD-10-CM | POA: Diagnosis not present

## 2019-11-23 DIAGNOSIS — F411 Generalized anxiety disorder: Secondary | ICD-10-CM | POA: Diagnosis not present

## 2019-12-07 DIAGNOSIS — F411 Generalized anxiety disorder: Secondary | ICD-10-CM | POA: Diagnosis not present

## 2019-12-14 DIAGNOSIS — F411 Generalized anxiety disorder: Secondary | ICD-10-CM | POA: Diagnosis not present

## 2019-12-28 DIAGNOSIS — F411 Generalized anxiety disorder: Secondary | ICD-10-CM | POA: Diagnosis not present

## 2020-01-11 DIAGNOSIS — F411 Generalized anxiety disorder: Secondary | ICD-10-CM | POA: Diagnosis not present

## 2020-01-18 DIAGNOSIS — F411 Generalized anxiety disorder: Secondary | ICD-10-CM | POA: Diagnosis not present

## 2020-01-22 ENCOUNTER — Other Ambulatory Visit: Payer: Self-pay | Admitting: Surgery

## 2020-01-22 DIAGNOSIS — Z9189 Other specified personal risk factors, not elsewhere classified: Secondary | ICD-10-CM

## 2020-02-17 ENCOUNTER — Other Ambulatory Visit: Payer: BC Managed Care – PPO

## 2020-03-06 ENCOUNTER — Ambulatory Visit
Admission: RE | Admit: 2020-03-06 | Discharge: 2020-03-06 | Disposition: A | Discharge status: Home / Self Care | Payer: BC Managed Care – PPO | Source: Ambulatory Visit | Attending: Surgery | Admitting: Surgery

## 2020-03-06 ENCOUNTER — Other Ambulatory Visit: Payer: Self-pay

## 2020-03-06 DIAGNOSIS — Z9189 Other specified personal risk factors, not elsewhere classified: Secondary | ICD-10-CM

## 2020-03-06 DIAGNOSIS — N6489 Other specified disorders of breast: Secondary | ICD-10-CM | POA: Diagnosis not present

## 2020-03-06 IMAGING — MR MR BREAST BILAT WO/W CM
8 of 12 series · 29 of 48 positions shown · IV contrast (9 ml gadavist)
Comparison: Recent mammography

CLINICAL DATA: The patient's paternal aunt had breast cancer.
History of benign lumpectomy for flat epithelial atypia [DATE]. No current problems.

LABS:  None
EXAM:
BILATERAL BREAST MRI WITH AND WITHOUT CONTRAST
TECHNIQUE: Multiplanar, multisequence MR images of both breasts were obtained
prior to and following the intravenous administration of 9 ml of
Gadavist

[Series 2: t2_tirm_tra ipat (a-p) · axial · 3.0mm · 0.68mm/px · 1 of 60 slices shown]
[im 1/60]
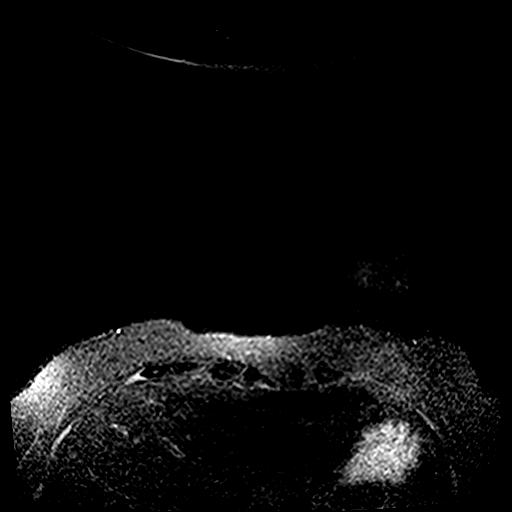

[Series 3: fl3d pre-cm no · axial · non-contrast · 0.9mm · 0.91mm/px · z∈[-110,+61]mm · 5 of 192 slices shown]
[im 1/192]
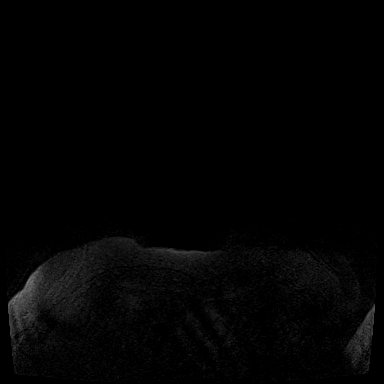
[im 48/192]
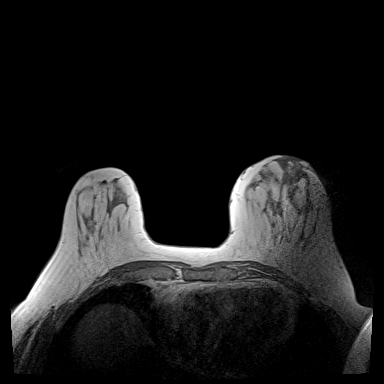
[im 96/192]
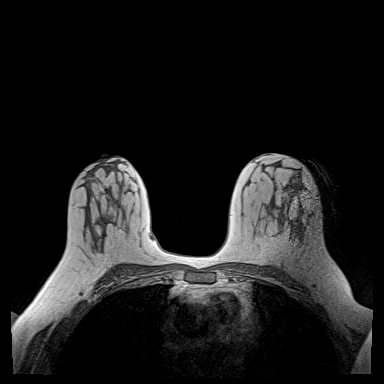
[im 144/192]
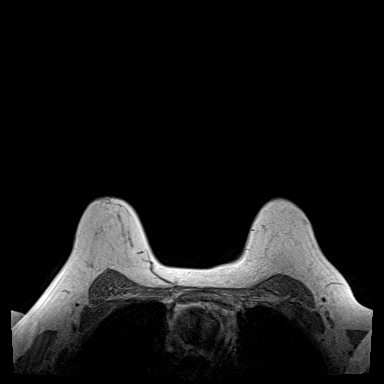
[im 192/192]
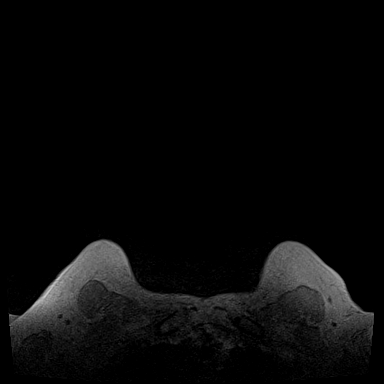

[Series 7: fl3d pre-cm · axial · non-contrast · 0.9mm · 0.87mm/px · z∈[-77,+95]mm · 5 of 192 slices shown]
[im 1/192]
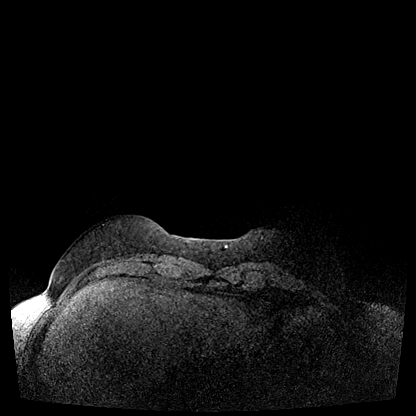
[im 48/192]
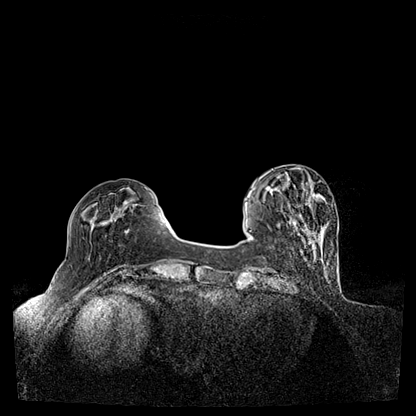
[im 96/192]
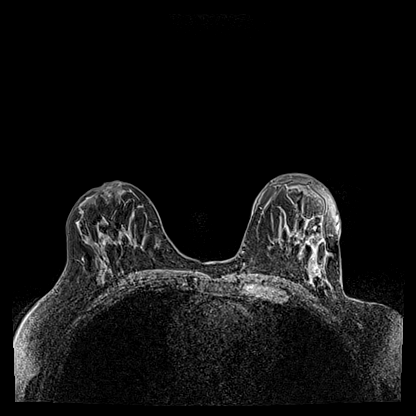
[im 144/192]
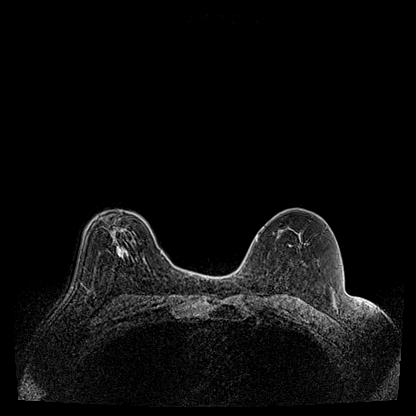
[im 192/192]
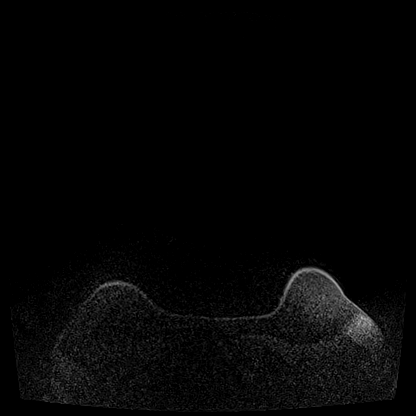

[Series 8: fl3d post-cm 20 · axial · 0.9mm · 0.87mm/px · z∈[-77,+95]mm · 5 of 192 slices shown (1 of 3)]
[im 1/192]
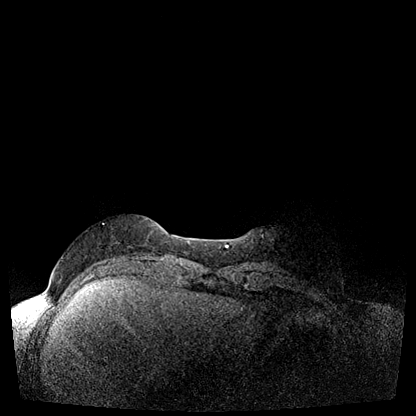
[im 48/192]
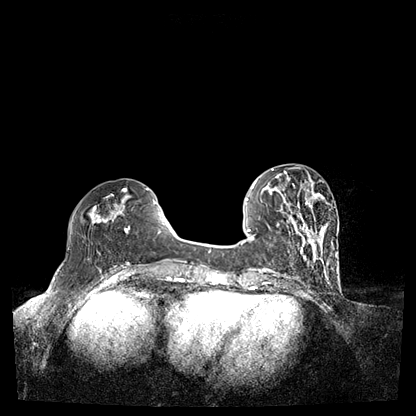
[im 96/192]
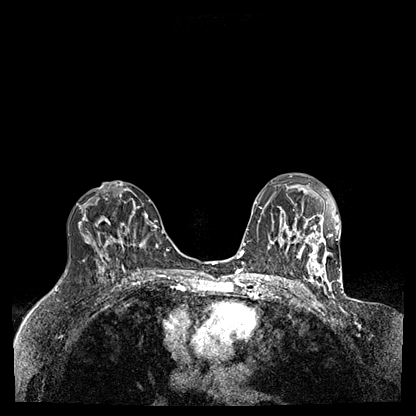
[im 144/192]
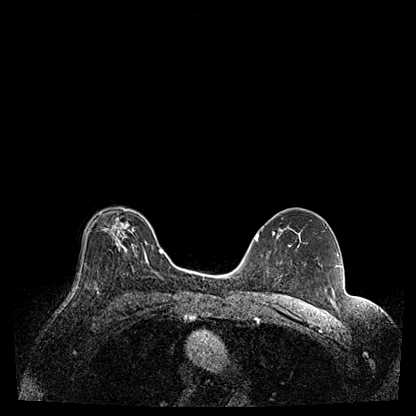
[im 192/192]
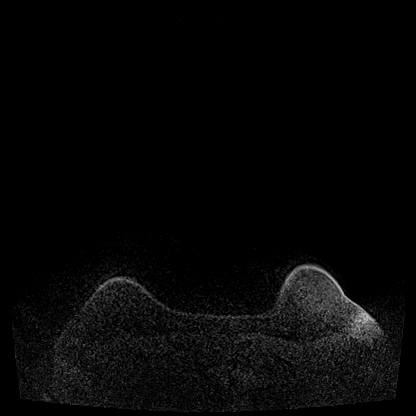

[Series 9: fl3d post-cm 20 · axial · 0.9mm · 0.87mm/px · z∈[-77,+95]mm · 5 of 192 slices shown (2 of 3)]
[im 1/192]
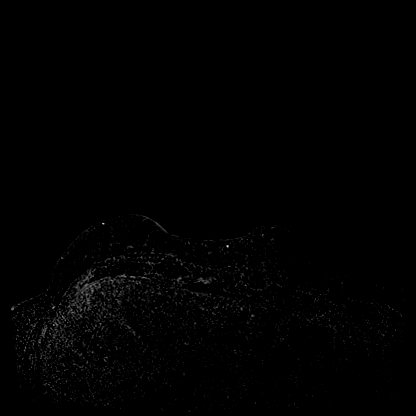
[im 48/192]
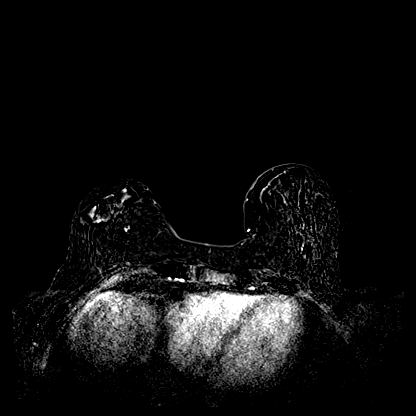
[im 96/192]
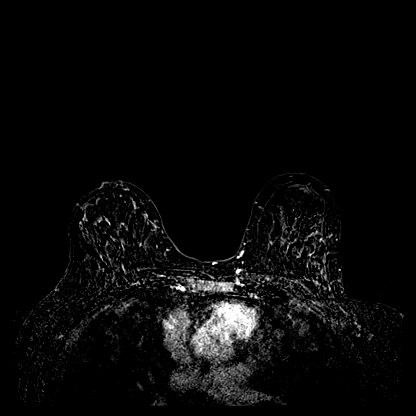
[im 144/192]
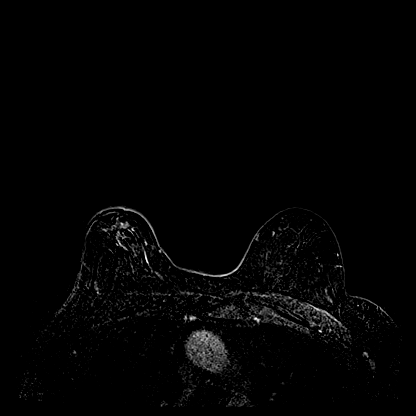
[im 192/192]
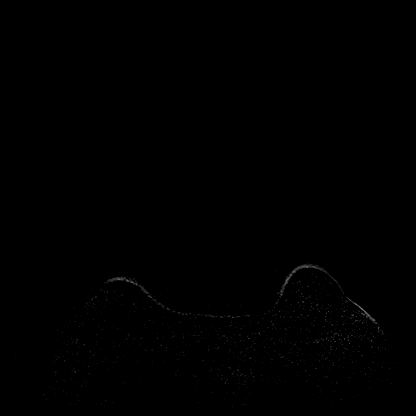

[Series 10: fl3d post-cm 20 · axial · 172.8mm · 0.87mm/px · 1 of 1 slices shown (3 of 3)]
[im 1/1]
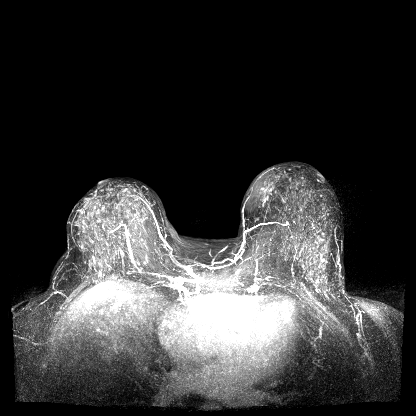

[Series 11: fl3d post-cm 3min · axial · 0.9mm · 0.87mm/px · z∈[-77,+95]mm · 6 of 192 slices shown]
[im 1/192]
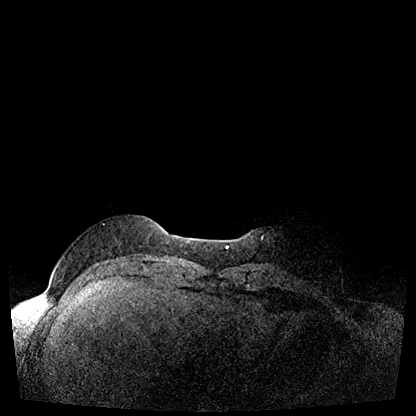
[im 39/192]
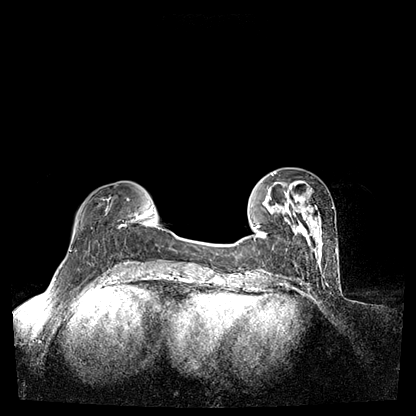
[im 77/192]
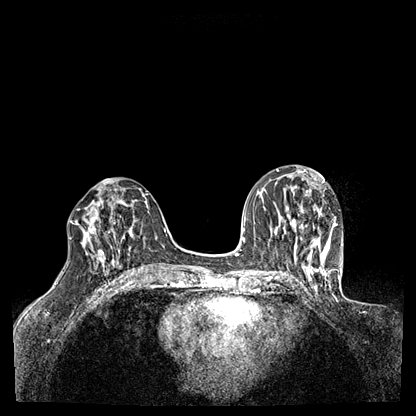
[im 115/192]
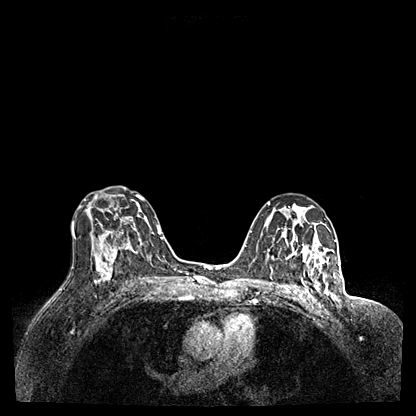
[im 153/192]
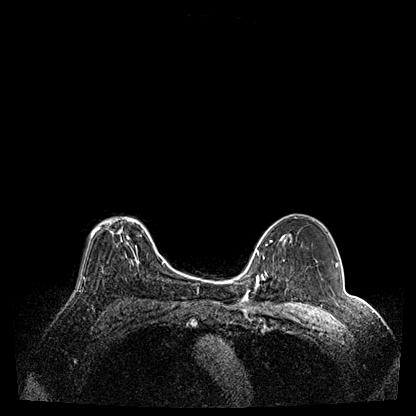
[im 192/192]
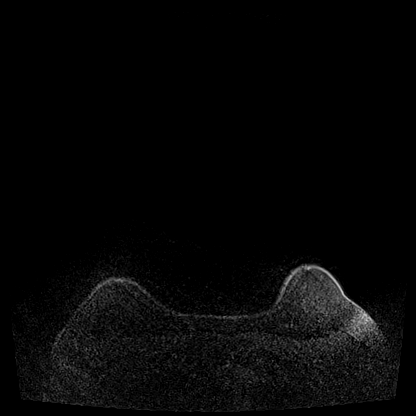

[Series 12: fl3d post-cm 3min_sub · axial · 0.9mm · 0.87mm/px · 1 of 192 slices shown]
[im 1/192]
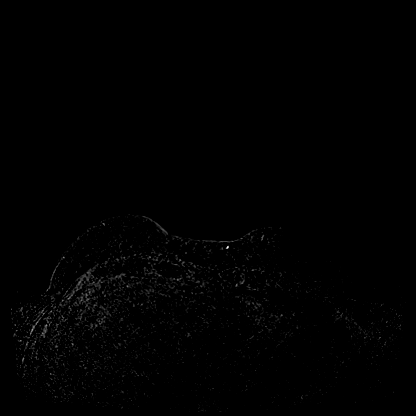

[29 of 48 positions shown; findings below may reference images not displayed]

Three-dimensional MR images were rendered by post-processing of the
original MR data on an independent workstation. The
three-dimensional MR images were interpreted, and findings are
reported in the following complete MRI report for this study. Three
dimensional images were evaluated at the independent interpreting
workstation using the DynaCAD thin client.
FINDINGS: Breast composition: c. Heterogeneous fibroglandular tissue.

Background parenchymal enhancement: Moderate.

Right breast: Postsurgical changes in the superior right breast
consistent with history. Multiple small enhancing foci of no
significance are identified. No suspicious masses or non mass
enhancement.

Left breast: Multiple small enhancing foci of no significance are
identified. No suspicious masses or non mass enhancement.

Lymph nodes: No abnormal appearing lymph nodes.

Ancillary findings:  None.
IMPRESSION: No MRI evidence of malignancy.

RECOMMENDATION:
If the patient is high risk for breast cancer, either through
detection of a genetic mutation or a calculated lifetime risk of
greater than 20%, recommend annual breast MRI and annual
mammography. If the patient is low or intermediate risk, recommend
annual mammography. If the patient is intermediate risk, she would
be eligible for an abbreviated breast MRI at the discretion of her
and her physician.

BI-RADS CATEGORY  2: Benign.

## 2020-03-06 MED ORDER — GADOBUTROL 1 MMOL/ML IV SOLN
9.0000 mL | Freq: Once | INTRAVENOUS | Status: AC | PRN
  Administered 2020-03-06: 9 mL via INTRAVENOUS

## 2020-04-18 DIAGNOSIS — F900 Attention-deficit hyperactivity disorder, predominantly inattentive type: Secondary | ICD-10-CM | POA: Diagnosis not present

## 2020-04-18 DIAGNOSIS — F331 Major depressive disorder, recurrent, moderate: Secondary | ICD-10-CM | POA: Diagnosis not present

## 2020-05-20 DIAGNOSIS — J09X2 Influenza due to identified novel influenza A virus with other respiratory manifestations: Secondary | ICD-10-CM | POA: Diagnosis not present

## 2020-05-20 DIAGNOSIS — R059 Cough, unspecified: Secondary | ICD-10-CM | POA: Diagnosis not present

## 2020-05-21 ENCOUNTER — Encounter (HOSPITAL_COMMUNITY): Payer: Self-pay | Admitting: Student

## 2020-05-21 ENCOUNTER — Emergency Department (HOSPITAL_COMMUNITY): Payer: BC Managed Care – PPO

## 2020-05-21 ENCOUNTER — Emergency Department (HOSPITAL_COMMUNITY)
Admission: EM | Admit: 2020-05-21 | Discharge: 2020-05-22 | Disposition: A | Discharge status: Home / Self Care | Payer: BC Managed Care – PPO | Attending: Emergency Medicine | Admitting: Emergency Medicine

## 2020-05-21 ENCOUNTER — Other Ambulatory Visit: Payer: Self-pay

## 2020-05-21 DIAGNOSIS — Z7982 Long term (current) use of aspirin: Secondary | ICD-10-CM | POA: Insufficient documentation

## 2020-05-21 DIAGNOSIS — J111 Influenza due to unidentified influenza virus with other respiratory manifestations: Secondary | ICD-10-CM

## 2020-05-21 DIAGNOSIS — R0602 Shortness of breath: Secondary | ICD-10-CM

## 2020-05-21 DIAGNOSIS — R059 Cough, unspecified: Secondary | ICD-10-CM | POA: Diagnosis not present

## 2020-05-21 LAB — I-STAT BETA HCG BLOOD, ED (MC, WL, AP ONLY): I-stat hCG, quantitative: <5 m[IU]/mL (ref <5)

## 2020-05-21 LAB — CBC WITH DIFFERENTIAL/PLATELET
Abs Immature Granulocytes: 0.04 10*3/uL (ref 0.00–0.07)
Basophils Absolute: 0.1 10*3/uL (ref 0.0–0.1)
Basophils Relative: 1 %
Eosinophils Absolute: 0.3 10*3/uL (ref 0.0–0.5)
Eosinophils Relative: 4 %
HCT: 35.5 % — ABNORMAL LOW (ref 36.0–46.0)
Hemoglobin: 11 g/dL — ABNORMAL LOW (ref 12.0–15.0)
Immature Granulocytes: 0 %
Lymphocytes Relative: 31 %
Lymphs Abs: 2.9 10*3/uL (ref 0.7–4.0)
MCH: 26.5 pg (ref 26.0–34.0)
MCHC: 31 g/dL (ref 30.0–36.0)
MCV: 85.5 fL (ref 80.0–100.0)
Monocytes Absolute: 1.3 10*3/uL — ABNORMAL HIGH (ref 0.1–1.0)
Monocytes Relative: 14 %
Neutro Abs: 4.6 10*3/uL (ref 1.7–7.7)
Neutrophils Relative %: 50 %
Platelets: 259 10*3/uL (ref 150–400)
RBC: 4.15 MIL/uL (ref 3.87–5.11)
RDW: 15.9 % — ABNORMAL HIGH (ref 11.5–15.5)
WBC: 9.3 10*3/uL (ref 4.0–10.5)
nRBC: 0 % (ref 0.0–0.2)

## 2020-05-21 IMAGING — DX DG CHEST 2V
2 series · 2 of 2 positions shown · non-contrast
Comparison: [DATE]

CLINICAL DATA: Shortness of breath and cough recent diagnosis of
flu

EXAM:
CHEST - 2 VIEW

[w chest pa]
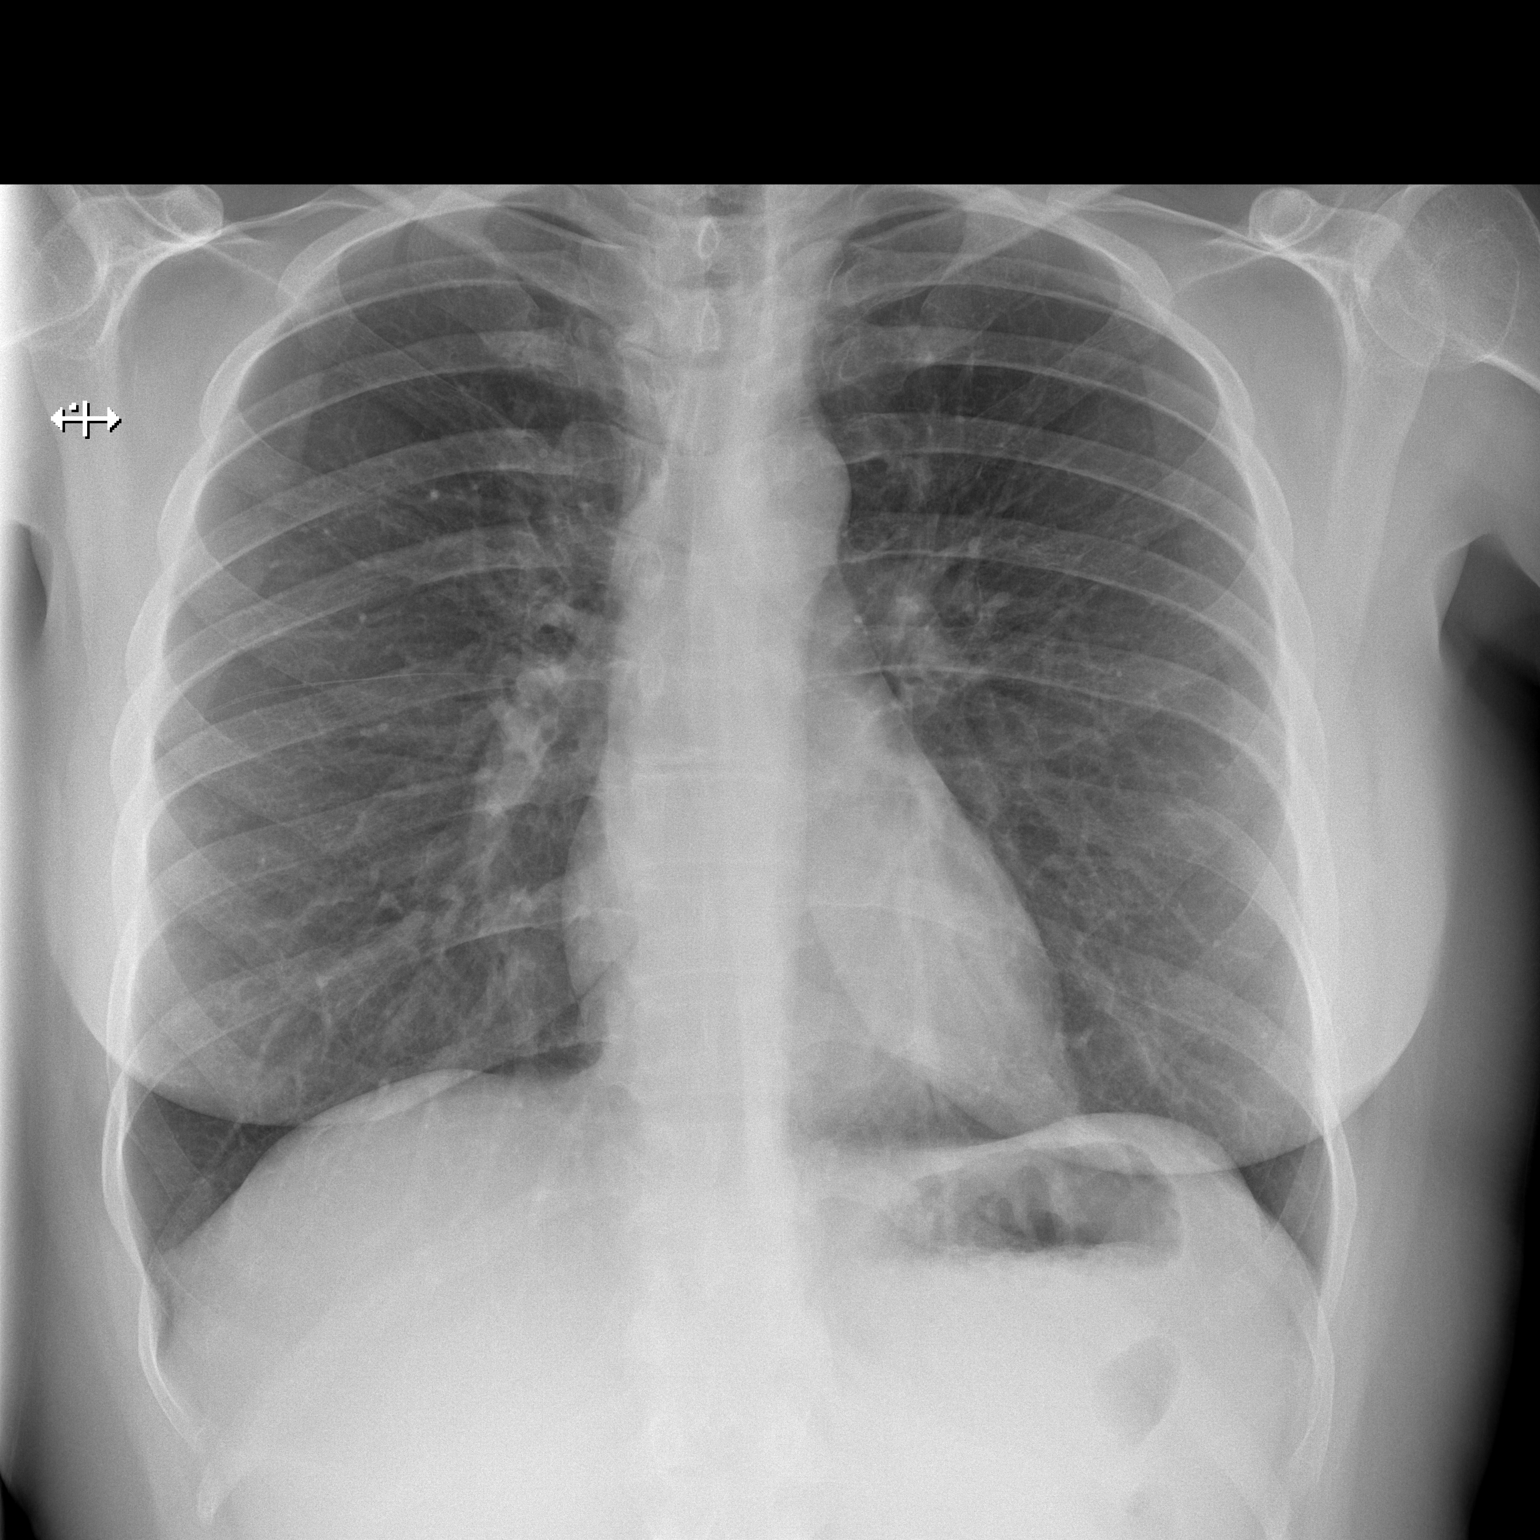

[w chest lat]
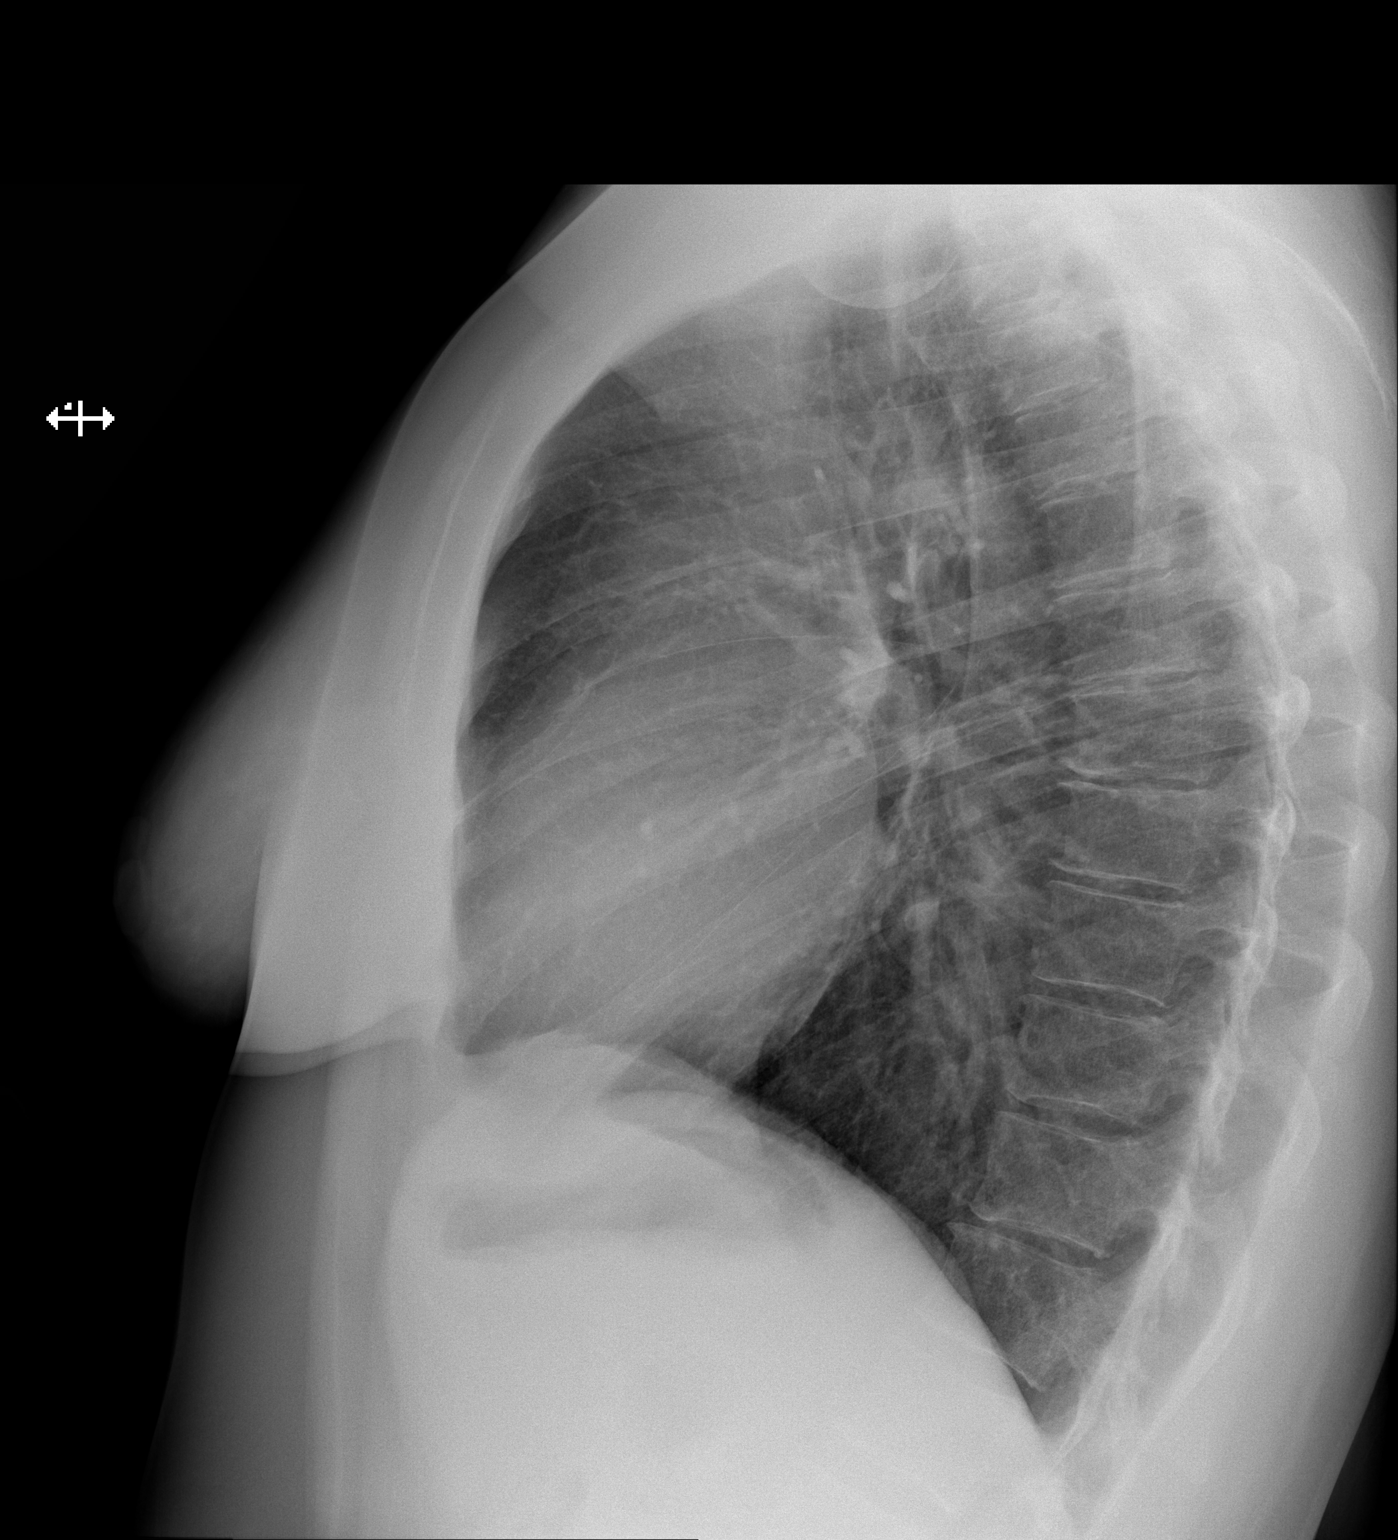

[2 of 2 positions shown; findings below may reference images not displayed]

FINDINGS: The heart size and mediastinal contours are within normal limits.
Both lungs are clear. The visualized skeletal structures are
unremarkable.
IMPRESSION: No active cardiopulmonary disease.

## 2020-05-21 MED ORDER — ALBUTEROL SULFATE HFA 108 (90 BASE) MCG/ACT IN AERS
4.0000 | INHALATION_SPRAY | Freq: Once | RESPIRATORY_TRACT | Status: AC
  Administered 2020-05-21: 4 via RESPIRATORY_TRACT

## 2020-05-21 NOTE — ED Provider Notes (Signed)
  Emergency Medicine Provider in Triage Note   MSE was initiated and I personally evaluated the patient  10:20 PM on May 21, 2020 as provider in triage.   Chief Complaint: cough, dyspnea  HPI  Patient is a 45 y.o. who presets to the ED with complaints of dyspnea. Sick for 4 days, cough, dyspnea, emesis, & chest pain w/ coughing- thinks she pulled a muscle. Diagnosed with flu A. On Abx currently. Received a 1 x dose tx for flu. Having continue dyspnea & cannot stop coughing.   Review of Systems  Positive: Cough, dyspnea, emesis, poor appetite, chest pain with coughing Negative: Fever, diarrhea, syncope.   Physical Exam  BP (!) 152/107   Pulse (!) 116   Temp 98.2 F (36.8 C) (Oral)   Resp 20   SpO2 99%    Gen:   Awake HEENT:  Atraumatic  Resp:  Actively coughing. Not in respiratory distress, no significant audible adventitious breath sounds appreciated. Chest wall tender to palpation anteriorly- reproduces patient's chest pain.  Cardiac:  tachycardic Abd:   Nondistended, nontender  MSK:   Moves extremities without difficulty  Neuro:  Speech clear.   Medical Decision Making   Initiation of care has begun. The patient has been counseled on the process, plan, and necessity for staying for the completion/evaluation, informed that the remainder of the evaluation will be completed by another provider, this initial triage assessment does not replace that evaluation, and the importance of remaining in the ED until their evaluation is complete.  Clinical Impression  Cough.         Desmond Lope 05/21/20 2228    Alvira Monday, MD 05/22/20 [phone]

## 2020-05-21 NOTE — ED Triage Notes (Signed)
Patient diagnosed with Flu A yesterday at PCP office.  Covid negative.  Patient comes in with more shortness of breath, cough and states what she has been given by PCP was antibiotics and cough med, which is not working well for her cough.  Patient with lots of phlegm production.

## 2020-05-22 LAB — COMPREHENSIVE METABOLIC PANEL
ALT: 17 U/L (ref 0–44)
AST: 29 U/L (ref 15–41)
Albumin: 3.7 g/dL (ref 3.5–5.0)
Alkaline Phosphatase: 76 U/L (ref 38–126)
Anion gap: 10 (ref 5–15)
BUN: 11 mg/dL (ref 6–20)
CO2: 24 mmol/L (ref 22–32)
Calcium: 9 mg/dL (ref 8.9–10.3)
Chloride: 106 mmol/L (ref 98–111)
Creatinine, Ser: 0.75 mg/dL (ref 0.44–1.00)
GFR, Estimated: >60 mL/min (ref >60)
Glucose, Bld: 72 mg/dL (ref 70–99)
Potassium: 3.6 mmol/L (ref 3.5–5.1)
Sodium: 140 mmol/L (ref 135–145)
Total Bilirubin: 0.5 mg/dL (ref 0.3–1.2)
Total Protein: 6.7 g/dL (ref 6.5–8.1)

## 2020-05-22 NOTE — ED Notes (Signed)
Pt eloped. Unable to get vitals or perform assessments at this time.

## 2020-05-22 NOTE — ED Provider Notes (Signed)
[facility] EMERGENCY DEPARTMENT Provider Note   CSN: [id number] Arrival date & time: 05/21/20  2134     History Chief Complaint  Patient presents with  . Influenza    KAYSEY BERNDT is a 45 y.o. female.  45 y.o female with a PMH of ADHD, Anemia, anxiety, presents to the ED with a chief complaint of cough, shortness of breath, chest pain x 4 days.  Patient was diagnosed with influenza on Monday by her primary care physician.  She was given for cough syrup,, flu patient without much improvement in her symptoms.  No alleviating or exacerbating factors.  She endorses chills, however did not measure her temperature.  States that she feels the cough with sputum is worsening, patient states "I am not getting any better ".  Does endorse chest pain, this occurs with coughing.  Denies any prior influenza vaccinations.  Denies any abdominal pain, nausea, vomiting.        The history is provided by the patient.  Influenza Presenting symptoms: cough   Presenting symptoms: no fever, no nausea, no sore throat and no vomiting   Severity:  Moderate Onset quality:  Gradual Duration:  4 days Progression:  Worsening Chronicity:  New Associated symptoms: chills        Past Medical History:  Diagnosis Date  . Acute posterior anal fissure   . ADHD (attention deficit hyperactivity disorder)   . Anemia   . Anorexia    history of eating disorder  . Anxiety   . Chronic idiopathic constipation   . Depression   . Epilepsy (HCC)   . Family history of bladder cancer   . Family history of breast cancer   . Family history of ovarian cancer   . GERD (gastroesophageal reflux disease)   . Obsessive compulsive disorder   . OCD (obsessive compulsive disorder)   . Seizures (HCC) 05/17/14    Patient Active Problem List   Diagnosis Date Noted  . Genetic testing 09/19/2019  . Family history of ovarian cancer   . Family history of breast cancer   . Family history of bladder  cancer   . Rectal pain 03/31/2017  . Chronic constipation 03/31/2017  . Gastroesophageal reflux disease 03/31/2017  . Partial symptomatic epilepsy with complex partial seizures, not intractable, without status epilepticus (HCC) 06/20/2014  . 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 Obsessive compulsive disorder   . Viral illness 05/19/2010  . Anal fissure 05/18/2010  . Normocytic anemia 05/18/2010  . Anxiety   . OCD (obsessive compulsive disorder)   . ADHD (attention deficit hyperactivity disorder)   . Anorexia     Past Surgical History:  Procedure Laterality Date  . BREAST LUMPECTOMY WITH RADIOACTIVE SEED LOCALIZATION Right 10/05/2019   Procedure: RIGHT BREAST LUMPECTOMY WITH RADIOACTIVE SEED LOCALIZATION;  Surgeon: Harriette Bouillon, MD;  Location: [facility];  Service: General;  Laterality: Right;  . LIPOSUCTION TRUNK    . TONSILLECTOMY       OB History    Gravida  3   Para  3   Term      Preterm      AB      Living  3     SAB      IAB      Ectopic      Multiple      Live Births  3           Family History  Problem Relation Age of Onset  . Parkinsonism Mother  52  . Hypertension Father   . Stroke Father   . Lymphoma Father 15  . Colon polyps Father   . Colon polyps Brother        at least 3  . Rheum arthritis Maternal Grandfather   . Parkinsonism Maternal Grandfather   . Diabetes Maternal Grandmother   . Parkinsonism Paternal Grandmother   . Autism spectrum disorder Daughter        [id number] del  . Skin cancer Maternal Aunt        BCC and SCC  . Breast cancer Paternal Aunt 63  . Ovarian cancer Paternal Aunt        dx in her early 59s  . Bladder Cancer Paternal Aunt        dx in her 34s  . Drug abuse Paternal Uncle   . Drug abuse Paternal Aunt   . Colon cancer Neg Hx   . Esophageal cancer Neg Hx   . Stomach cancer Neg Hx   . Liver disease Neg Hx   . Pancreatic cancer Neg Hx     Social History   Tobacco Use  .  Smoking status: Never Smoker  . Smokeless tobacco: Never Used  Substance Use Topics  . Alcohol use: Yes    Alcohol/week: 1.0 standard drink    Types: 1 Glasses of wine per week    Comment: occasional wine  . Drug use: No    Home Medications Prior to Admission medications   Medication Sig Start Date End Date Taking? Authorizing Provider  ALPRAZolam Prudy Feeler) 1 MG tablet Take 1 mg by mouth daily as needed for anxiety. For anxiety    [provider]  aspirin-acetaminophen-caffeine (EXCEDRIN MIGRAINE) [phone] MG per tablet Take 2 tablets by mouth every 6 (six) hours as needed for headache.    [provider]  buPROPion (WELLBUTRIN XL) 150 MG 24 hr tablet Take 1 tablet by mouth daily. 01/07/17   [provider]  buPROPion (WELLBUTRIN XL) 300 MG 24 hr tablet Take 1 tablet by mouth daily. 01/02/17   [provider]  Desvenlafaxine Succinate (PRISTIQ PO) Take by mouth.    [provider]  HYDROcodone-acetaminophen (NORCO/VICODIN) 5-325 MG tablet Take 1 tablet by mouth every 6 (six) hours as needed for moderate pain. 10/05/19   Cornett, Maisie Fus, MD  ibuprofen (ADVIL) 800 MG tablet Take 1 tablet (800 mg total) by mouth every 8 (eight) hours as needed. 10/05/19   Cornett, Maisie Fus, MD  Lisdexamfetamine Dimesylate (VYVANSE PO) Take by mouth.    [provider]  pantoprazole (PROTONIX) 40 MG tablet TAKE 1 TABLET BY MOUTH EVERY DAY 04/17/19   Pyrtle, Carie Caddy, MD  Plecanatide (TRULANCE) 3 MG TABS Take 1 tablet by mouth daily. 09/23/18   Pyrtle, Carie Caddy, MD  topiramate (TOPAMAX) 100 MG tablet Take 200 mg by mouth 2 (two) times daily.  11/28/15   [provider]    Allergies    Ciprofloxacin and Percocet [oxycodone-acetaminophen]  Review of Systems   Review of Systems  Constitutional: Positive for chills and diaphoresis. Negative for fever.  HENT: Negative for sore throat.   Respiratory: Positive for cough.   Cardiovascular: Positive for chest pain.   Gastrointestinal: Negative for abdominal pain, nausea and vomiting.  All other systems reviewed and are negative.   Physical Exam Updated Vital Signs BP 113/86 (BP Location: Right Arm)   Pulse 83   Temp 98.2 F (36.8 C) (Oral)   Resp 18   SpO2 100%   Physical  Exam Vitals and nursing note reviewed.  Constitutional:      Appearance: Normal appearance.  HENT:     Head: Normocephalic and atraumatic.     Nose: Nose normal.  Cardiovascular:     Rate and Rhythm: Normal rate.  Pulmonary:     Effort: Pulmonary effort is normal.     Breath sounds: No wheezing, rhonchi or rales.     Comments: Lungs are clear to auscultation without any wheezing, rhonchi or rales.  Abdominal:     General: Abdomen is flat.  Musculoskeletal:     Cervical back: Normal range of motion and neck supple.  Skin:    General: Skin is warm and dry.  Neurological:     Mental Status: She is alert and oriented to person, place, and time.     ED Results / Procedures / Treatments   Labs (all labs ordered are listed, but only abnormal results are displayed) Labs Reviewed  CBC WITH DIFFERENTIAL/PLATELET - Abnormal; Notable for the following components:      Result Value   Hemoglobin 11.0 (*)    HCT 35.5 (*)    RDW 15.9 (*)    Monocytes Absolute 1.3 (*)    All other components within normal limits  COMPREHENSIVE METABOLIC PANEL  I-STAT BETA HCG BLOOD, ED ([facility], [facility], [facility] ONLY)    EKG None  Radiology DG Chest 2 View  Result Date: 05/21/2020 CLINICAL DATA:  Shortness of breath and cough recent diagnosis of flu EXAM: CHEST - 2 VIEW COMPARISON:  December 20, 2015 FINDINGS: The heart size and mediastinal contours are within normal limits. Both lungs are clear. The visualized skeletal structures are unremarkable. IMPRESSION: No active cardiopulmonary disease. Electronically Signed   By: Maudry Mayhew MD   On: 05/21/2020 22:47    Procedures Procedures   Medications Ordered in ED Medications  albuterol  (VENTOLIN HFA) 108 (90 Base) MCG/ACT inhaler 4 puff (4 puffs Inhalation Given 05/21/20 2226)    ED Course  I have reviewed the triage vital signs and the nursing notes.  Pertinent labs & imaging results that were available during my care of the patient were reviewed by me and considered in my medical decision making (see chart for details).    MDM Rules/Calculators/A&P     Patient status post influenza diagnoses 2 days ago at her PCPs office, treated with cough syrup, influenza medication, antibiotics but reports no improvement in symptoms in the past 2 days.  She endorses chills but without any fevers.Patient was evaluated by me after 10 hours in the waiting room.   During primary evaluation patient arrived in the ED the previous day around 10 PM, her vitals were remarkable for tachycardia, hypertension, oxygen saturation at 99%.  When I saw patient this morning last vital intake was at 630, remarkable for a normal heart rate, normotensive, stating at 100% on room air. Lungs are cleared to auscultation without any wheezing, rhonchi, rales. No distant heart sounds.  Attempted to review chart along with occasions the patient was previously given by PCP, however PCP does not share records and unable to obtain this information. Evaluation was terminated abruptly as patient reports "I waited 10 hours in the waiting room for nothing", "I know I should have just gone to my primary care physician".  Unable to complete my exam as patient stood up from stretcher and walked out of the emergency department.  I did discuss with patient that all her labs look within normal limits and her x-ray did not  show any pneumonia.  Therefore they would just be symptomatic treatment needed at this time.  Xray of the chest showed: No active cardiopulmonary disease.  Unable to fulfill visit as patient eloped from the emergency visit.   Portions of this note were generated with Scientist, clinical (histocompatibility and immunogenetics)Dragon dictation software. Dictation  errors may occur despite best attempts at proofreading.  Final Clinical Impression(s) / ED Diagnoses Final diagnoses:  Shortness of breath  Influenza    Rx / DC Orders ED Discharge Orders    None       Claude MangesSoto, [place], PA-C 05/22/20 [id number]    Rozelle LoganHorton, Kristie M, DO 05/22/20 1358

## 2020-05-22 NOTE — ED Notes (Signed)
Pt eloped after seen by PA Soto.

## 2020-05-29 DIAGNOSIS — D649 Anemia, unspecified: Secondary | ICD-10-CM | POA: Diagnosis not present

## 2020-06-05 DIAGNOSIS — Z0000 Encounter for general adult medical examination without abnormal findings: Secondary | ICD-10-CM | POA: Diagnosis not present

## 2020-06-05 DIAGNOSIS — Z23 Encounter for immunization: Secondary | ICD-10-CM | POA: Diagnosis not present

## 2020-06-05 DIAGNOSIS — G40909 Epilepsy, unspecified, not intractable, without status epilepticus: Secondary | ICD-10-CM | POA: Diagnosis not present

## 2020-06-05 DIAGNOSIS — R7301 Impaired fasting glucose: Secondary | ICD-10-CM | POA: Diagnosis not present

## 2020-06-21 DIAGNOSIS — F419 Anxiety disorder, unspecified: Secondary | ICD-10-CM | POA: Diagnosis not present

## 2020-06-21 DIAGNOSIS — G40109 Localization-related (focal) (partial) symptomatic epilepsy and epileptic syndromes with simple partial seizures, not intractable, without status epilepticus: Secondary | ICD-10-CM | POA: Diagnosis not present

## 2020-06-21 DIAGNOSIS — Z79899 Other long term (current) drug therapy: Secondary | ICD-10-CM | POA: Diagnosis not present

## 2020-06-21 DIAGNOSIS — G43009 Migraine without aura, not intractable, without status migrainosus: Secondary | ICD-10-CM | POA: Diagnosis not present

## 2020-09-26 ENCOUNTER — Ambulatory Visit (INDEPENDENT_AMBULATORY_CARE_PROVIDER_SITE_OTHER): Payer: BC Managed Care – PPO

## 2020-09-26 ENCOUNTER — Other Ambulatory Visit: Payer: Self-pay

## 2020-09-26 ENCOUNTER — Ambulatory Visit (INDEPENDENT_AMBULATORY_CARE_PROVIDER_SITE_OTHER): Payer: BC Managed Care – PPO | Admitting: Podiatry

## 2020-09-26 DIAGNOSIS — M722 Plantar fascial fibromatosis: Secondary | ICD-10-CM

## 2020-09-26 MED ORDER — TRIAMCINOLONE ACETONIDE 10 MG/ML IJ SUSP
10.0000 mg | Freq: Once | INTRAMUSCULAR | Status: AC
  Administered 2020-09-26: 10 mg

## 2020-09-26 MED ORDER — DICLOFENAC SODIUM 75 MG PO TBEC: 75.0000 mg | DELAYED_RELEASE_TABLET | Freq: Two times a day (BID) | ORAL | 2 refills | Status: DC

## 2020-09-26 NOTE — Progress Notes (Signed)
Subjective:   Patient ID: Julia [name], female   DOB: 45 y.o.   MRN: [id number]   HPI Patient presents with chronic pain in the left heel of approximate 3 months duration states its been very sore for her and is making it hard for her to be active and she is on her feet all the time at home.  Patient does not smoke likes to be active   Review of Systems  All other systems reviewed and are negative.      Objective:  Physical Exam Vitals and nursing note reviewed.  Constitutional:      Appearance: She is well-developed.  Pulmonary:     Effort: Pulmonary effort is normal.  Musculoskeletal:        General: Normal range of motion.  Skin:    General: Skin is warm.  Neurological:     Mental Status: She is alert.    Neurovascular status intact muscle strength adequate range of motion within normal limits with exquisite discomfort plantar aspect left heel at the insertional point of the tendon into the calcaneus with inflammation fluid around the medial band.  Patient is found to have good digital perfusion well oriented x3     Assessment:  Acute plantar fasciitis left with inflammation fluid of the medial band     Plan:  H&P reviewed condition and recommended conservative care.  I went ahead today did sterile prep injected the medial band of the fascia 3 mg Kenalog 5 mg Xylocaine and applied fascial brace to lift up the arch.  Gave instructions on oral anti-inflammatory diclofenac and discussed long-term orthotics and reappoint in several weeks to reevaluate  X-rays indicate large spur no indication stress fracture arthritis

## 2020-09-26 NOTE — Patient Instructions (Signed)

## 2020-09-27 ENCOUNTER — Ambulatory Visit: Payer: BC Managed Care – PPO | Admitting: Podiatry

## 2020-10-10 ENCOUNTER — Encounter: Payer: Self-pay | Admitting: Podiatry

## 2020-10-10 ENCOUNTER — Other Ambulatory Visit: Payer: Self-pay

## 2020-10-10 ENCOUNTER — Ambulatory Visit (INDEPENDENT_AMBULATORY_CARE_PROVIDER_SITE_OTHER): Payer: BC Managed Care – PPO | Admitting: Podiatry

## 2020-10-10 DIAGNOSIS — M722 Plantar fascial fibromatosis: Secondary | ICD-10-CM | POA: Diagnosis not present

## 2020-10-10 MED ORDER — TRIAMCINOLONE ACETONIDE 10 MG/ML IJ SUSP
10.0000 mg | Freq: Once | INTRAMUSCULAR | Status: AC
  Administered 2020-10-10: 10 mg

## 2020-10-10 NOTE — Progress Notes (Signed)
Subjective:   Patient ID: Julia [name], female   DOB: 45 y.o.   MRN: [id number]   HPI Patient presents stating her heel pain has gotten better on the inside but is hurting more on the outside and she is still most likely long-term will need some form of support therapy    ROS      Objective:  Physical Exam  Neurovascular status intact with discomfort on the plantar aspect of the heel left more on the lateral central band of the medial band at this time.  Continued flatfoot deformity     Assessment:  Acute Planter fasciitis continuing more in the lateral and central band of the plantar fascia     Plan:  H&P reviewed condition and recommended further injection with support therapy and the possibility long-term of orthotics.  Did sterile prep injected the lateral band and into the central band 3 mg Dexasone Kenalog 5 mg Xylocaine advised on anti-inflammatories reappoint to recheck.  Would definitely recommend long-term orthotics for this patient

## 2020-10-21 DIAGNOSIS — F3342 Major depressive disorder, recurrent, in full remission: Secondary | ICD-10-CM | POA: Diagnosis not present

## 2020-10-21 DIAGNOSIS — F900 Attention-deficit hyperactivity disorder, predominantly inattentive type: Secondary | ICD-10-CM | POA: Diagnosis not present

## 2020-11-07 ENCOUNTER — Encounter: Payer: Self-pay | Admitting: Podiatry

## 2020-11-07 ENCOUNTER — Ambulatory Visit (INDEPENDENT_AMBULATORY_CARE_PROVIDER_SITE_OTHER): Payer: BC Managed Care – PPO | Admitting: Podiatry

## 2020-11-07 ENCOUNTER — Other Ambulatory Visit: Payer: Self-pay

## 2020-11-07 DIAGNOSIS — K644 Residual hemorrhoidal skin tags: Secondary | ICD-10-CM | POA: Insufficient documentation

## 2020-11-07 DIAGNOSIS — M722 Plantar fascial fibromatosis: Secondary | ICD-10-CM | POA: Diagnosis not present

## 2020-11-07 DIAGNOSIS — N83209 Unspecified ovarian cyst, unspecified side: Secondary | ICD-10-CM | POA: Insufficient documentation

## 2020-11-07 DIAGNOSIS — R1032 Left lower quadrant pain: Secondary | ICD-10-CM | POA: Diagnosis not present

## 2020-11-07 DIAGNOSIS — L709 Acne, unspecified: Secondary | ICD-10-CM | POA: Insufficient documentation

## 2020-11-07 NOTE — Progress Notes (Signed)
Subjective:   Patient ID: Julia [name], female   DOB: 45 y.o.   MRN: [id number]   HPI My pain seems better in the outside now its in the center and inside again and states well not as bad as it was still quite sore with ambulation still not able to do what I want   ROS      Objective:  Physical Exam  Neurovascular status intact with continued discomfort in the medial central band of the plantar fascial moderate nature with mild swelling and erythema around the area.  Lateral band improved     Assessment:  Continuation of stubborn plantar fascial inflammation left     Plan:  H&P spent a great deal time going over different shoe gear modifications that she can do along with different activities.  I did dispense night splint with all instructions on usage along with ice and topical medications and continue oral medications.  Discussed orthotics discussed possible immobilization with future injection if symptoms persist and reappoint 4 weeks to make a decision on where this is going and how it is doing with now 11-month history of condition

## 2020-11-28 DIAGNOSIS — R5383 Other fatigue: Secondary | ICD-10-CM | POA: Diagnosis not present

## 2020-12-05 ENCOUNTER — Ambulatory Visit: Payer: BC Managed Care – PPO | Admitting: Podiatry

## 2020-12-13 DIAGNOSIS — R5383 Other fatigue: Secondary | ICD-10-CM | POA: Diagnosis not present

## 2020-12-13 DIAGNOSIS — D649 Anemia, unspecified: Secondary | ICD-10-CM | POA: Diagnosis not present

## 2020-12-26 DIAGNOSIS — J3489 Other specified disorders of nose and nasal sinuses: Secondary | ICD-10-CM | POA: Diagnosis not present

## 2020-12-26 DIAGNOSIS — K219 Gastro-esophageal reflux disease without esophagitis: Secondary | ICD-10-CM | POA: Diagnosis not present

## 2021-02-20 ENCOUNTER — Encounter (HOSPITAL_COMMUNITY): Payer: Self-pay

## 2021-03-31 ENCOUNTER — Other Ambulatory Visit: Payer: Self-pay | Admitting: Obstetrics and Gynecology

## 2021-03-31 ENCOUNTER — Other Ambulatory Visit: Payer: Self-pay | Admitting: *Deleted

## 2021-03-31 ENCOUNTER — Other Ambulatory Visit: Payer: Self-pay | Admitting: Surgery

## 2021-03-31 DIAGNOSIS — Z9189 Other specified personal risk factors, not elsewhere classified: Secondary | ICD-10-CM

## 2021-03-31 DIAGNOSIS — Z1231 Encounter for screening mammogram for malignant neoplasm of breast: Secondary | ICD-10-CM

## 2021-04-03 ENCOUNTER — Ambulatory Visit: Payer: BC Managed Care – PPO

## 2021-04-07 DIAGNOSIS — F900 Attention-deficit hyperactivity disorder, predominantly inattentive type: Secondary | ICD-10-CM | POA: Diagnosis not present

## 2021-04-07 DIAGNOSIS — F3342 Major depressive disorder, recurrent, in full remission: Secondary | ICD-10-CM | POA: Diagnosis not present

## 2021-04-14 ENCOUNTER — Ambulatory Visit
Admission: RE | Admit: 2021-04-14 | Discharge: 2021-04-14 | Disposition: A | Discharge status: Home / Self Care | Payer: BC Managed Care – PPO | Source: Ambulatory Visit | Attending: Obstetrics and Gynecology | Admitting: Obstetrics and Gynecology

## 2021-04-14 DIAGNOSIS — Z1231 Encounter for screening mammogram for malignant neoplasm of breast: Secondary | ICD-10-CM

## 2021-04-14 IMAGING — MG MM DIGITAL SCREENING BILAT W/ TOMO AND CAD
8 series · 9 of 24 positions shown · non-contrast
Comparison: Previous exam(s).

CLINICAL DATA: Screening.

EXAM:
DIGITAL SCREENING BILATERAL MAMMOGRAM WITH TOMOSYNTHESIS AND CAD
TECHNIQUE: Bilateral screening digital craniocaudal and mediolateral oblique
mammograms were obtained. Bilateral screening digital breast
tomosynthesis was performed. The images were evaluated with
computer-aided detection.

[L MLO synth-2D]
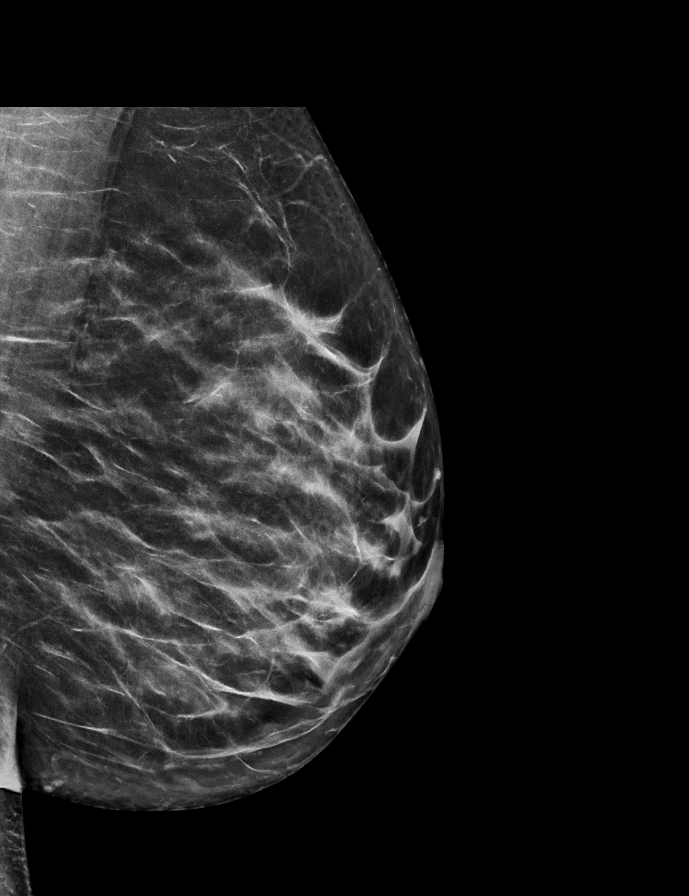

[R MLO synth-2D]
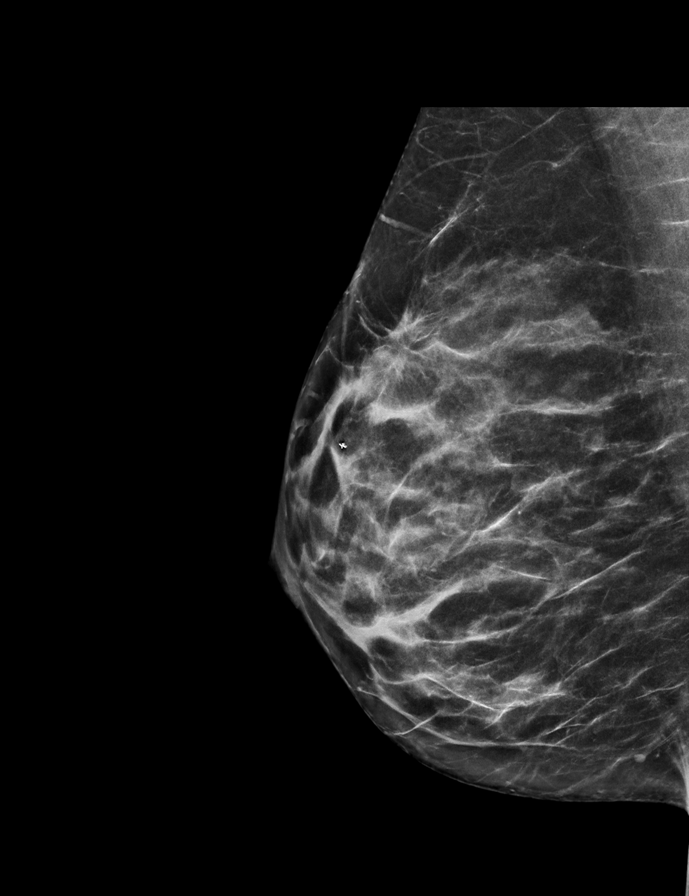

[R CC synth-2D]
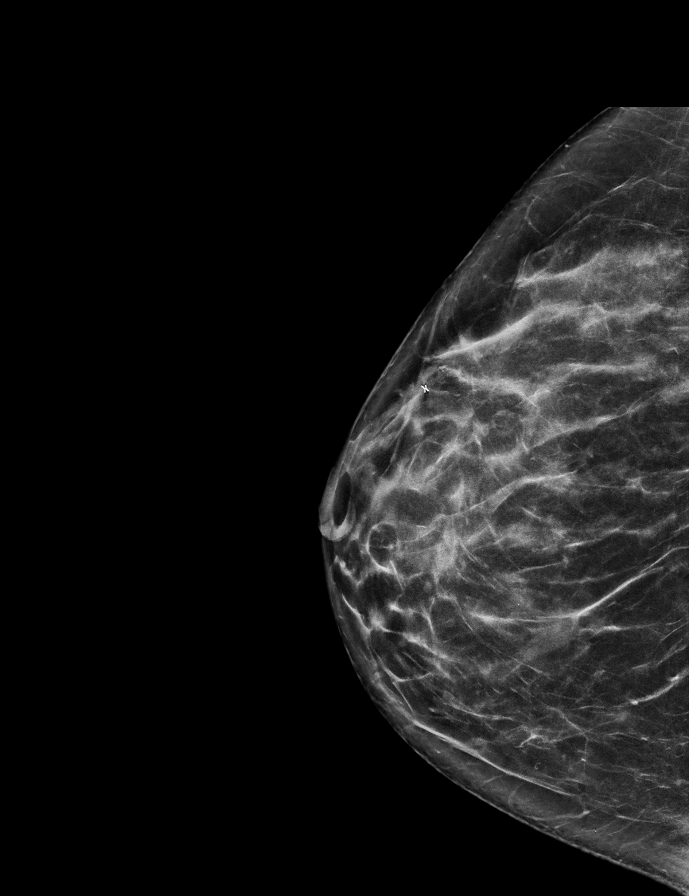

[L CC synth-2D]
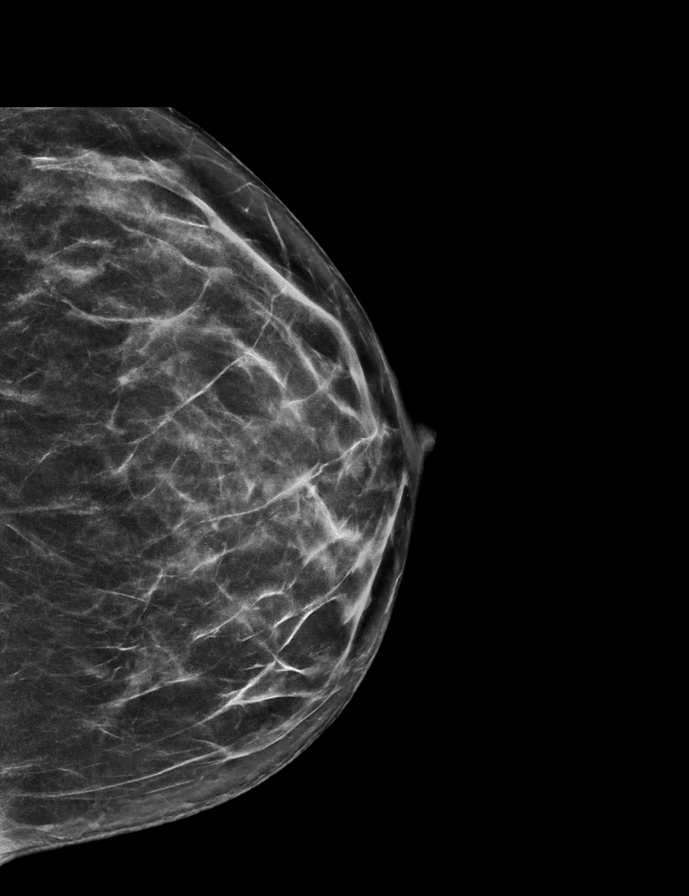

[R MLO tomo · 2 of 64 frames shown]
[frame 21/64]
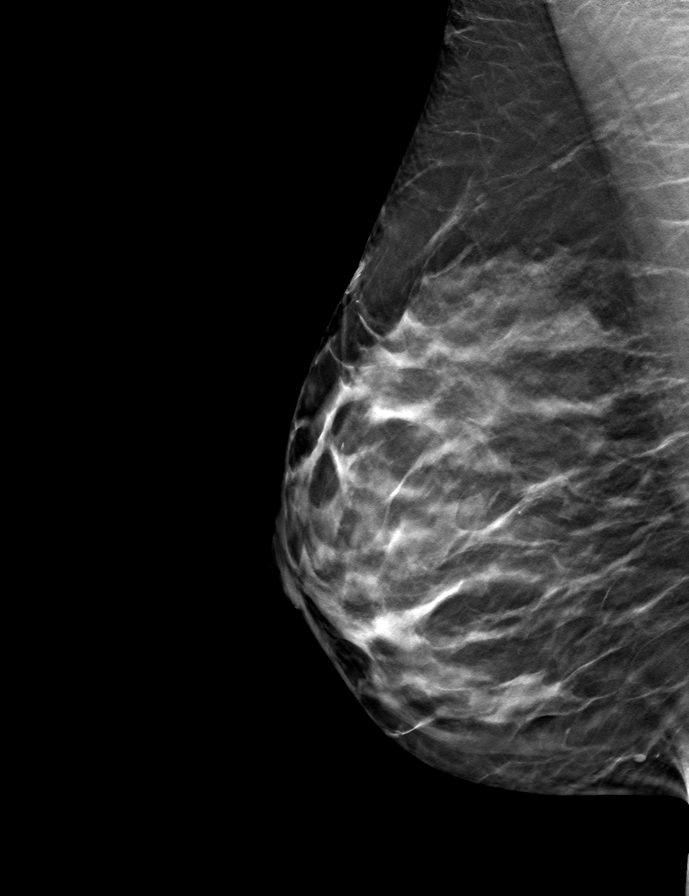
[frame 33/64]
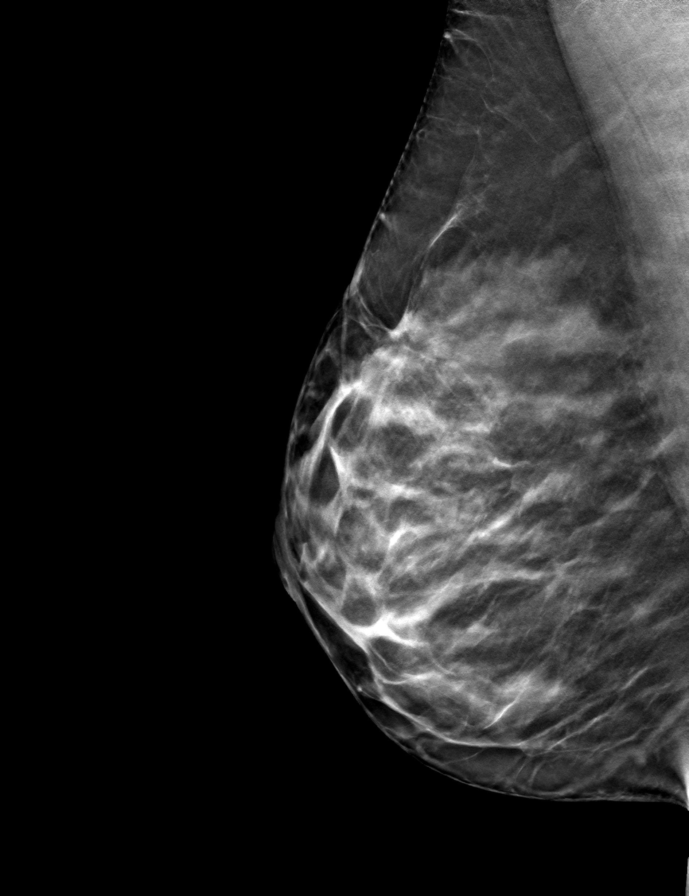

[R CC tomo · tomo slice 33/66.0]
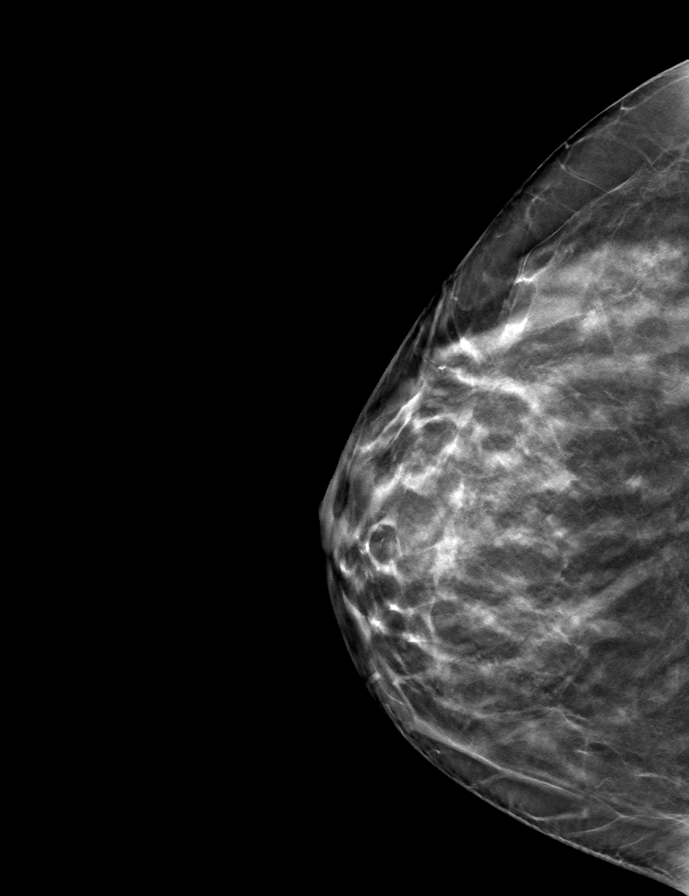

[L MLO tomo · tomo slice 36/71.0]
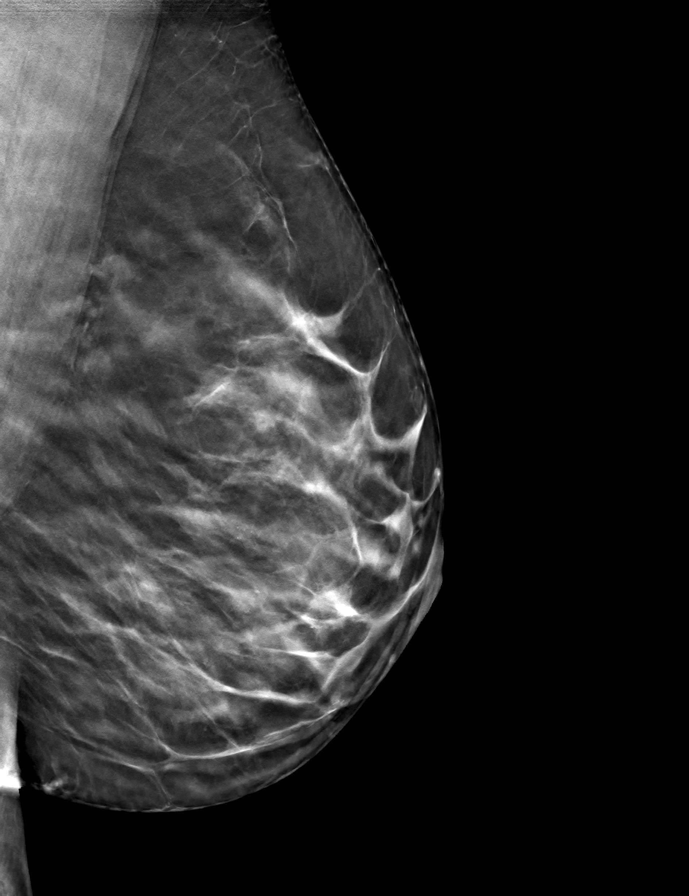

[L CC tomo · tomo slice 32/63.0]
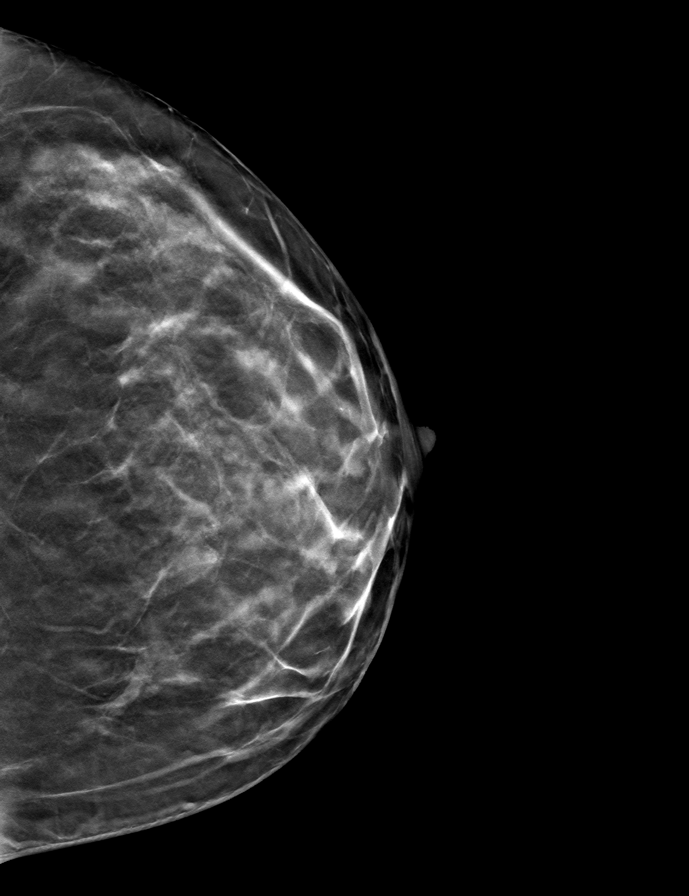

[9 of 24 positions shown; findings below may reference images not displayed]

ACR Breast Density Category c: The breast tissue is heterogeneously
dense, which may obscure small masses.
FINDINGS: There are no findings suspicious for malignancy.
IMPRESSION: No mammographic evidence of malignancy. A result letter of this
screening mammogram will be mailed directly to the patient.

RECOMMENDATION:
Screening mammogram in one year. (Code:[V2])

BI-RADS CATEGORY  1: Negative.

## 2021-04-18 ENCOUNTER — Ambulatory Visit
Admission: RE | Admit: 2021-04-18 | Discharge: 2021-04-18 | Disposition: A | Discharge status: Home / Self Care | Payer: BC Managed Care – PPO | Source: Ambulatory Visit | Attending: Obstetrics and Gynecology | Admitting: Obstetrics and Gynecology

## 2021-04-18 ENCOUNTER — Other Ambulatory Visit: Payer: Self-pay

## 2021-04-18 DIAGNOSIS — N6311 Unspecified lump in the right breast, upper outer quadrant: Secondary | ICD-10-CM | POA: Diagnosis not present

## 2021-04-18 DIAGNOSIS — Z9189 Other specified personal risk factors, not elsewhere classified: Secondary | ICD-10-CM

## 2021-04-18 IMAGING — MR MR BREAST BILAT WO/W CM
8 of 12 series · 32 of 48 positions shown · IV contrast (8 ml gadavist)
Comparison: Previous exam(s).

CLINICAL DATA: 45-year-old female with family history of breast
cancer and ovarian cancer in a paternal aunt. She has personal
history of benign excisional biopsy of the right breast for flat
epithelial atypia which was initially biopsied in [DATE].
Fibrocystic change was found on excisional biopsy.

EXAM:
BILATERAL BREAST MRI WITH AND WITHOUT CONTRAST
TECHNIQUE: Multiplanar, multisequence MR images of both breasts were obtained
prior to and following the intravenous administration of 8 ml of
Gadavist

[Series 2: t2_tirm_tra ipat (a-p) · axial · 3.0mm · 0.78mm/px · 1 of 73 slices shown]
[im 1/73]
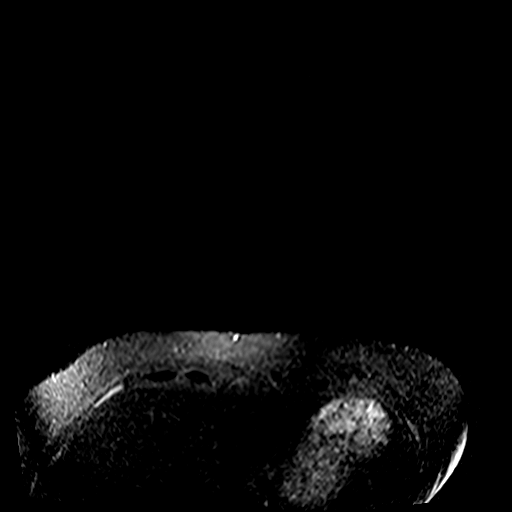

[Series 3: fl3d pre-cm non · axial · non-contrast · 1.2mm · 1.04mm/px · z∈[-156,+74]mm · 5 of 192 slices shown]
[im 1/192]
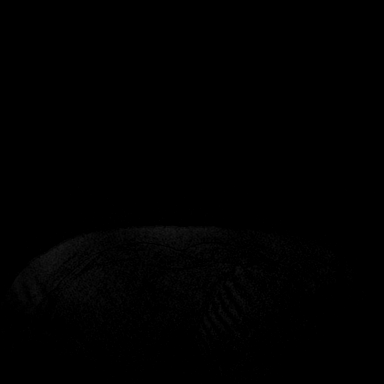
[im 48/192]
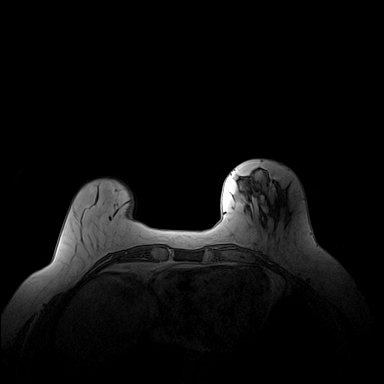
[im 96/192]
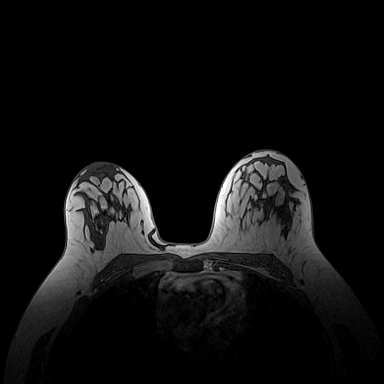
[im 144/192]
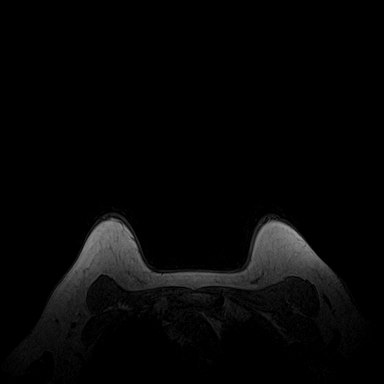
[im 192/192]
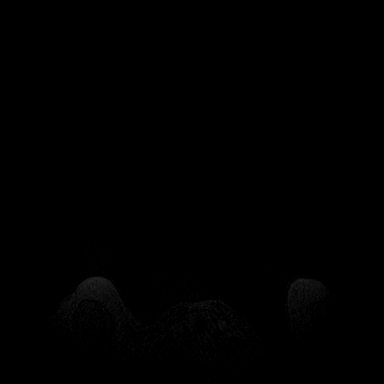

[Series 4: fl3d pre-cm · axial · non-contrast · 1.2mm · 1.04mm/px · z∈[-156,+74]mm · 5 of 192 slices shown]
[im 1/192]
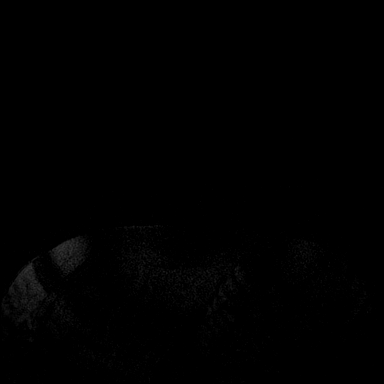
[im 48/192]
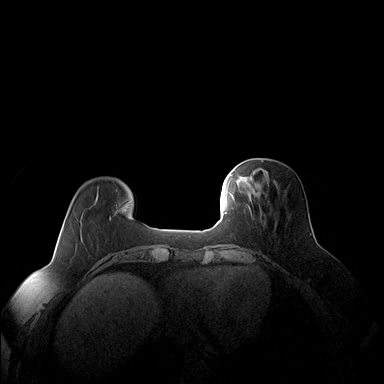
[im 96/192]
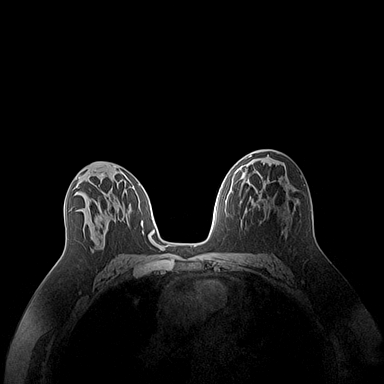
[im 144/192]
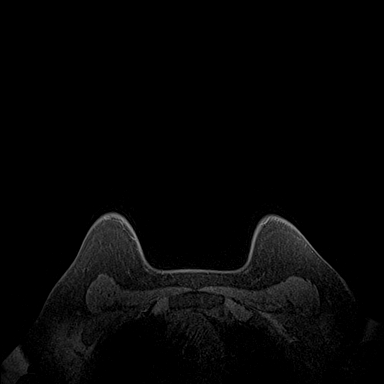
[im 192/192]
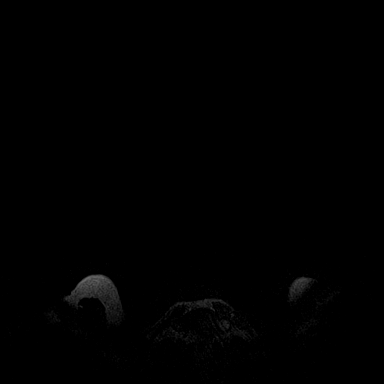

[Series 5: fl3d pre-cm 20 · axial · non-contrast · 1.2mm · 1.04mm/px · z∈[-156,+74]mm · 5 of 192 slices shown (1 of 3)]
[im 1/192]
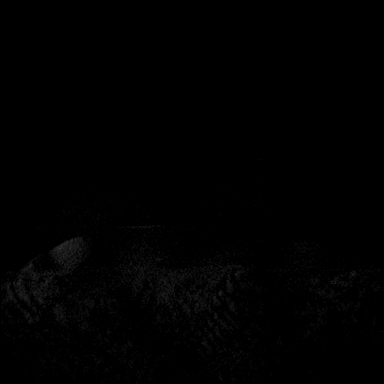
[im 48/192]
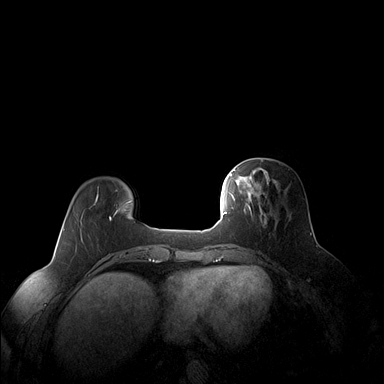
[im 96/192]
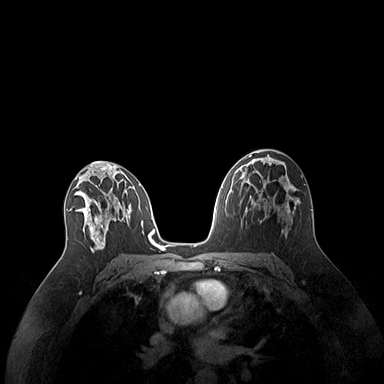
[im 144/192]
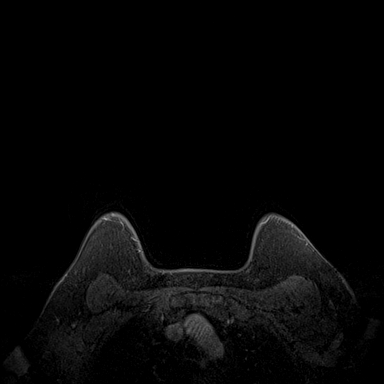
[im 192/192]
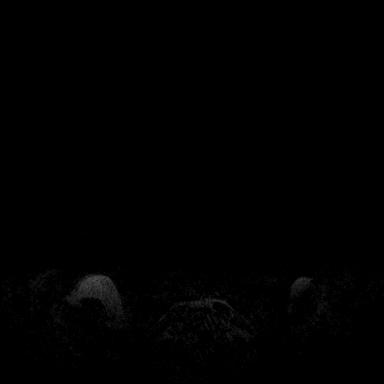

[Series 6: fl3d pre-cm 20 · axial · non-contrast · 1.2mm · 1.04mm/px · z∈[-156,+74]mm · 5 of 192 slices shown (2 of 3)]
[im 1/192]
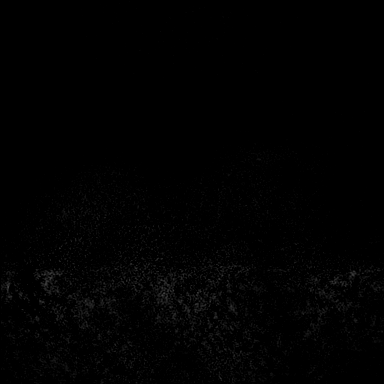
[im 48/192]
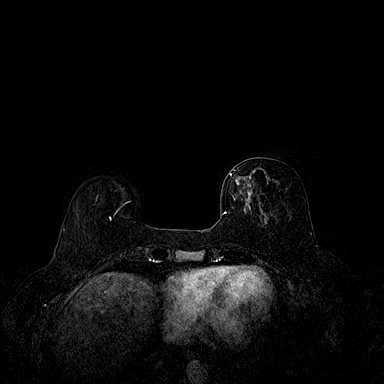
[im 96/192]
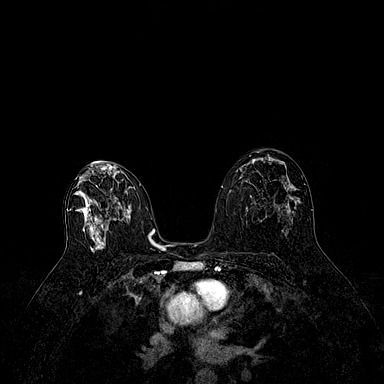
[im 144/192]
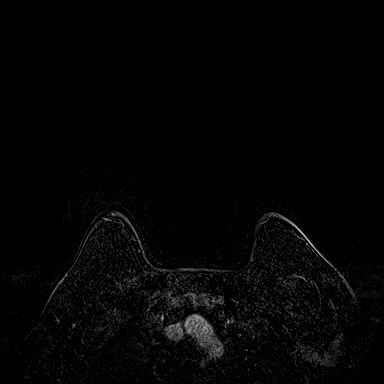
[im 192/192]
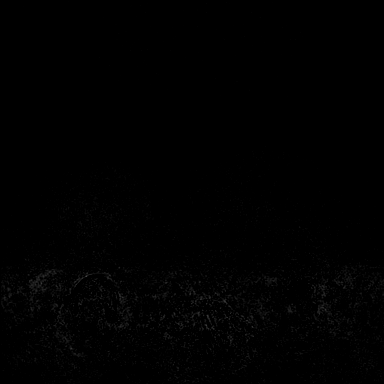

[Series 7: fl3d pre-cm 20 · axial · non-contrast · 230.4mm · 1.04mm/px · 1 of 1 slices shown (3 of 3)]
[im 1/1]
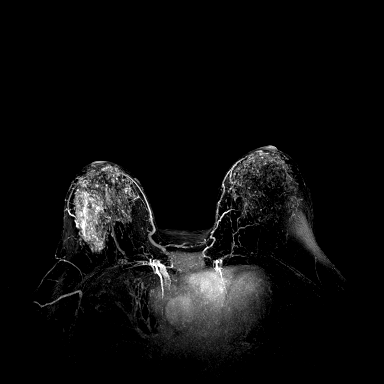

[Series 8: fl3d pre-cm 3min · axial · non-contrast · 1.2mm · 1.04mm/px · z∈[-156,+74]mm · 6 of 192 slices shown]
[im 1/192]
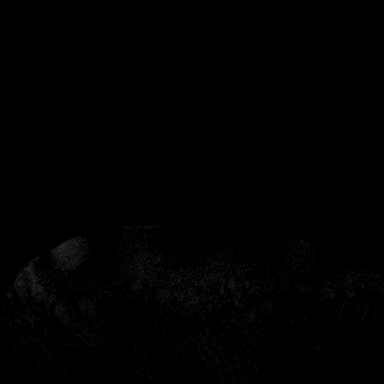
[im 39/192]
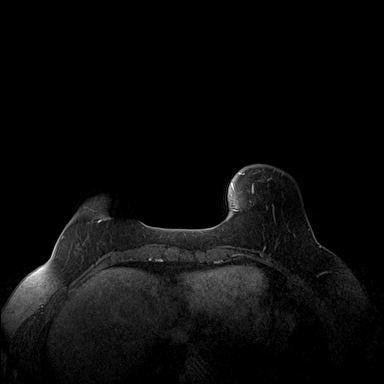
[im 77/192]
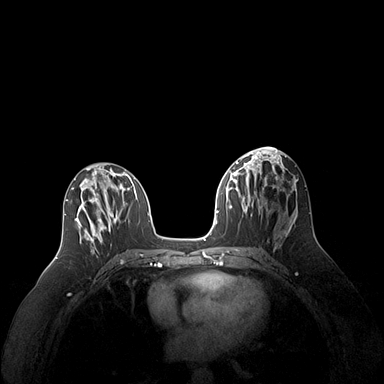
[im 115/192]
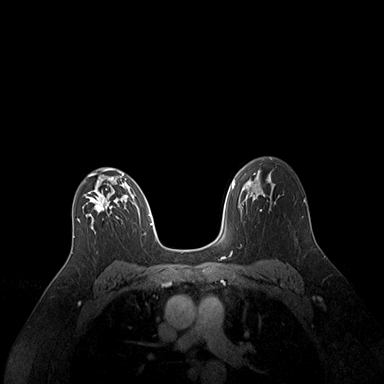
[im 153/192]
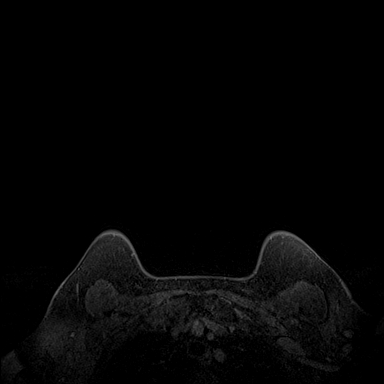
[im 192/192]
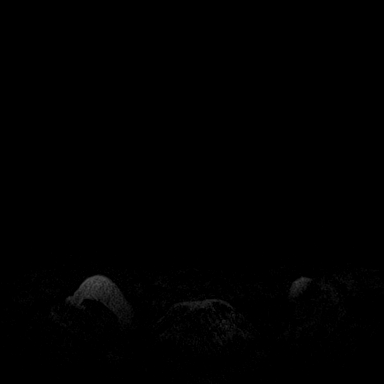

[Series 9: fl3d pre-cm 3min_sub · axial · non-contrast · 1.2mm · 1.04mm/px · z∈[-156,-19]mm · 4 of 192 slices shown]
[im 1/192]
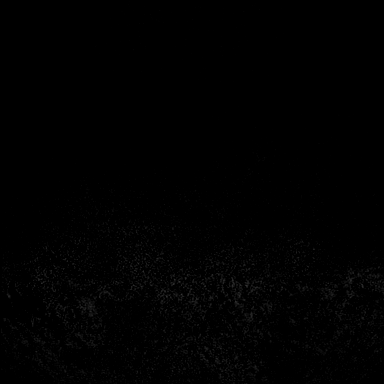
[im 39/192]
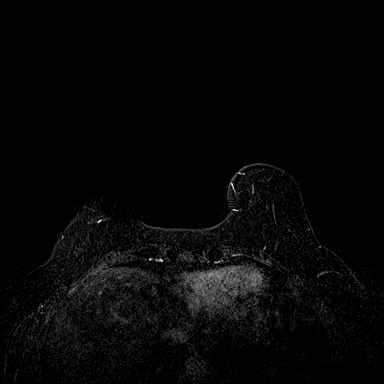
[im 77/192]
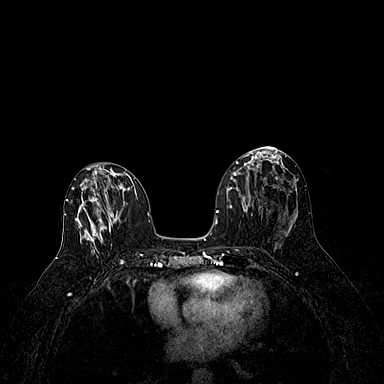
[im 115/192]
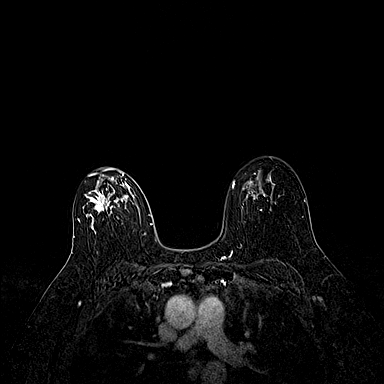

[32 of 48 positions shown; findings below may reference images not displayed]

Three-dimensional MR images were rendered by post-processing of the
original MR data on an independent workstation. The
three-dimensional MR images were interpreted, and findings are
reported in the following complete MRI report for this study. Three
dimensional images were evaluated at the independent interpreting
workstation using the DynaCAD thin client.
FINDINGS: Breast composition: c. Heterogeneous fibroglandular tissue.

Background parenchymal enhancement: Mild.

Right breast: Surgical distortion is noted in the upper-outer
quadrant of the right breast. There is prominent non mass
enhancement originating at the base of the area of distortion and
extending inferior and posterior in the lateral right breast (series
6, images 76 through 106). This non mass enhancement extends 8 cm in
the anterior to posterior dimension. While this could be related to
the patient's background parenchymal enhancement, it is asymmetric
to the contralateral breast. No enhancing masses are identified.

Left breast: No mass or abnormal enhancement.

Lymph nodes: No abnormal appearing lymph nodes.

Ancillary findings:  None.
IMPRESSION: 1. New broad area of non mass enhancement in the upper-outer to
lateral aspect of the right breast spanning 8 cm. This may be
related to the patient's background parenchymal enhancement, however
it is asymmetric from the left breast.

2.  No evidence of left breast malignancy.

RECOMMENDATION:
MRI guided biopsy is recommended for the anterior and posterior
margin of the right breast non mass enhancement. I suggest that the
patient return for this biopsy at a different phase of her menstrual
cycle in case this is related to background parenchymal enhancement.

BI-RADS CATEGORY  4: Suspicious.

## 2021-04-18 MED ORDER — GADOBUTROL 1 MMOL/ML IV SOLN
8.0000 mL | Freq: Once | INTRAVENOUS | Status: AC | PRN
  Administered 2021-04-18: 8 mL via INTRAVENOUS

## 2021-04-22 ENCOUNTER — Other Ambulatory Visit: Payer: Self-pay | Admitting: Obstetrics and Gynecology

## 2021-04-22 DIAGNOSIS — R9389 Abnormal findings on diagnostic imaging of other specified body structures: Secondary | ICD-10-CM

## 2021-05-02 ENCOUNTER — Ambulatory Visit
Admission: RE | Admit: 2021-05-02 | Discharge: 2021-05-02 | Disposition: A | Discharge status: Home / Self Care | Payer: BC Managed Care – PPO | Source: Ambulatory Visit | Attending: Obstetrics and Gynecology | Admitting: Obstetrics and Gynecology

## 2021-05-02 DIAGNOSIS — R9389 Abnormal findings on diagnostic imaging of other specified body structures: Secondary | ICD-10-CM

## 2021-05-02 DIAGNOSIS — R928 Other abnormal and inconclusive findings on diagnostic imaging of breast: Secondary | ICD-10-CM | POA: Diagnosis not present

## 2021-05-02 DIAGNOSIS — N6031 Fibrosclerosis of right breast: Secondary | ICD-10-CM | POA: Diagnosis not present

## 2021-05-02 DIAGNOSIS — N6011 Diffuse cystic mastopathy of right breast: Secondary | ICD-10-CM | POA: Diagnosis not present

## 2021-05-02 IMAGING — MG MM BREAST LOCALIZATION CLIP
4 series · 4 of 12 positions shown · non-contrast
Comparison: Previous exam(s).

CLINICAL DATA: Evaluate post biopsy marker clip placement following
MRI guided core needle biopsy of 2 areas of asymmetric enhancement
in the upper outer right breast.

EXAM:
3D DIAGNOSTIC RIGHT MAMMOGRAM POST MRI BIOPSY

[R CC synth-2D]
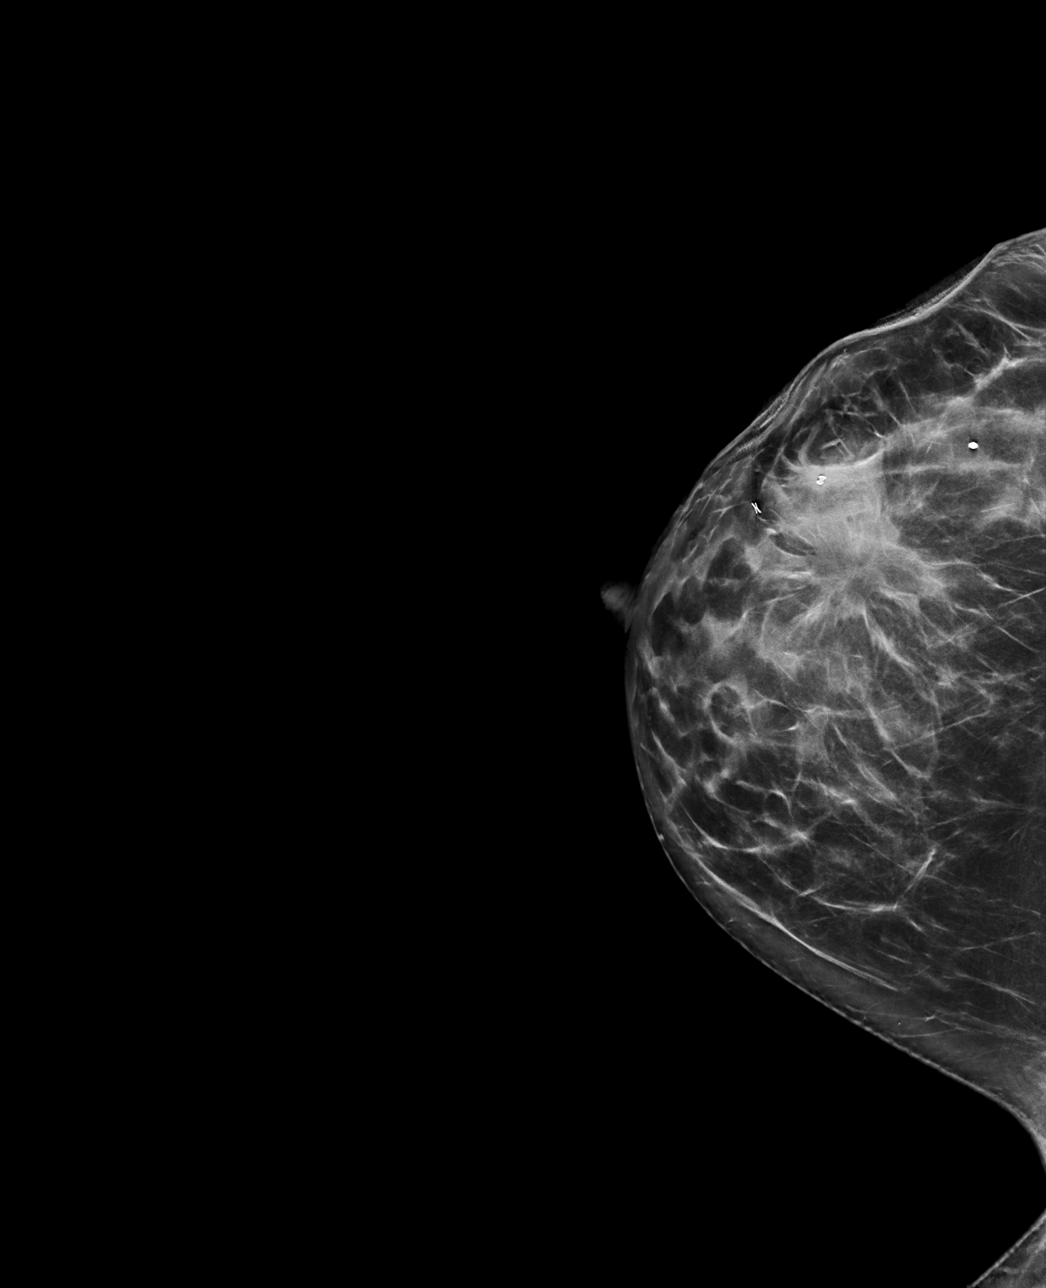

[R ML synth-2D]
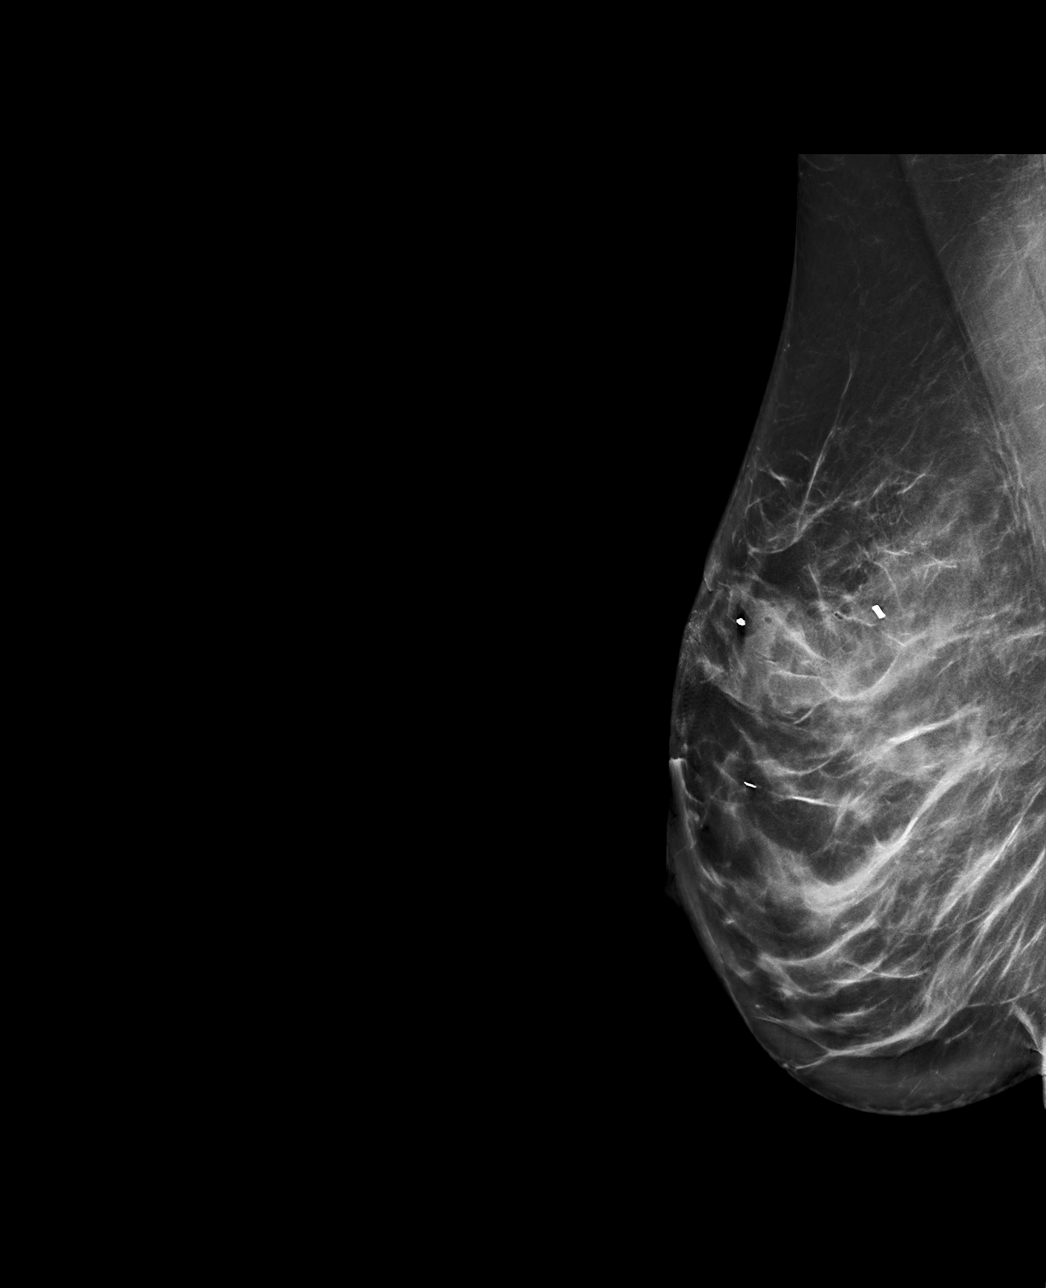

[R ML tomo · tomo slice 49/98.0]
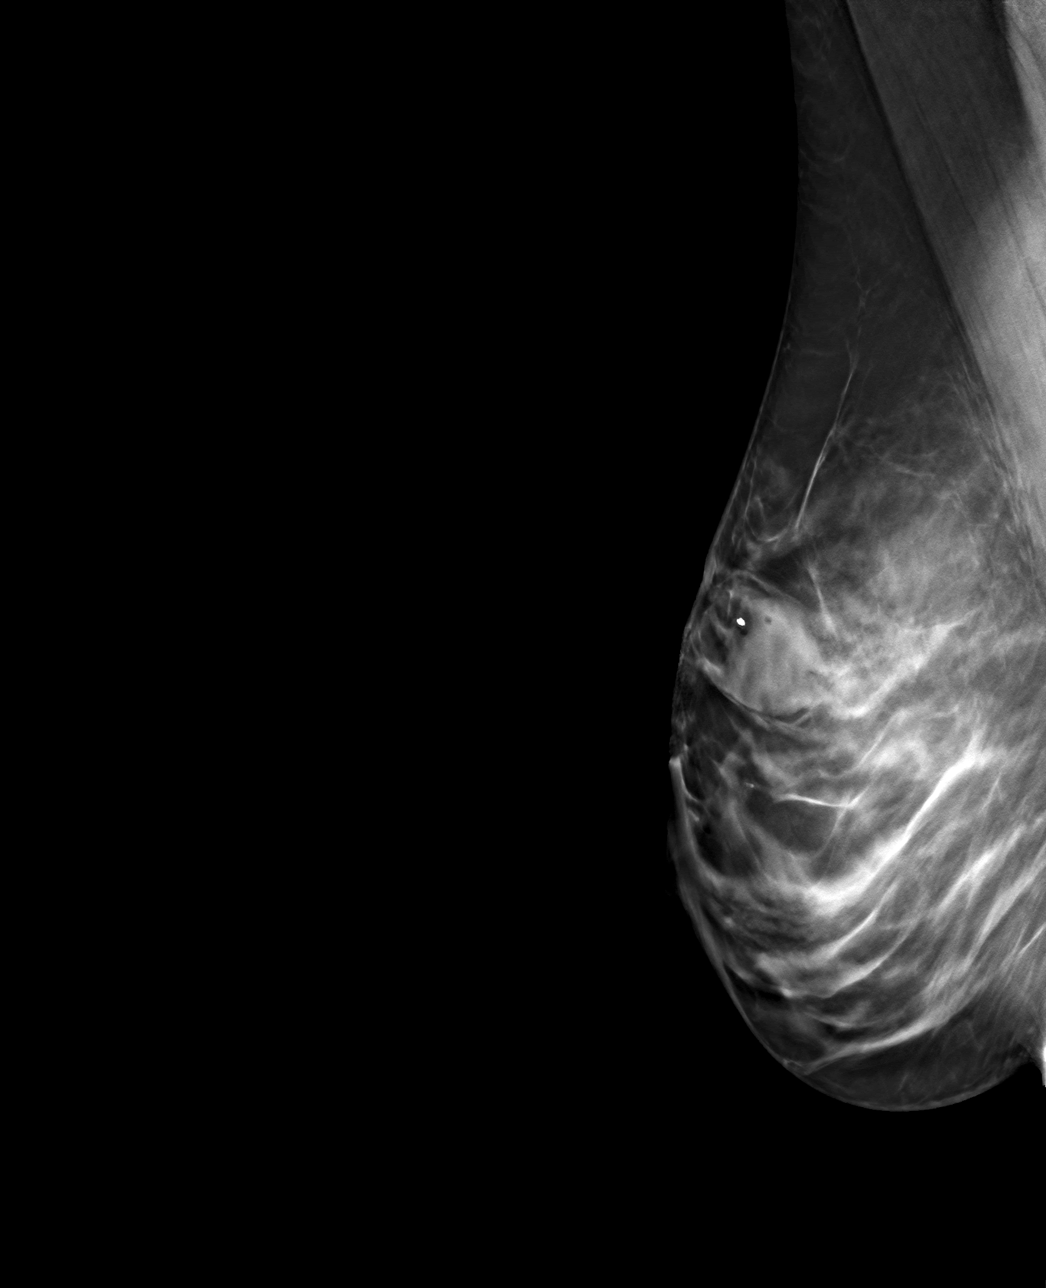

[R CC tomo · tomo slice 41/80.0]
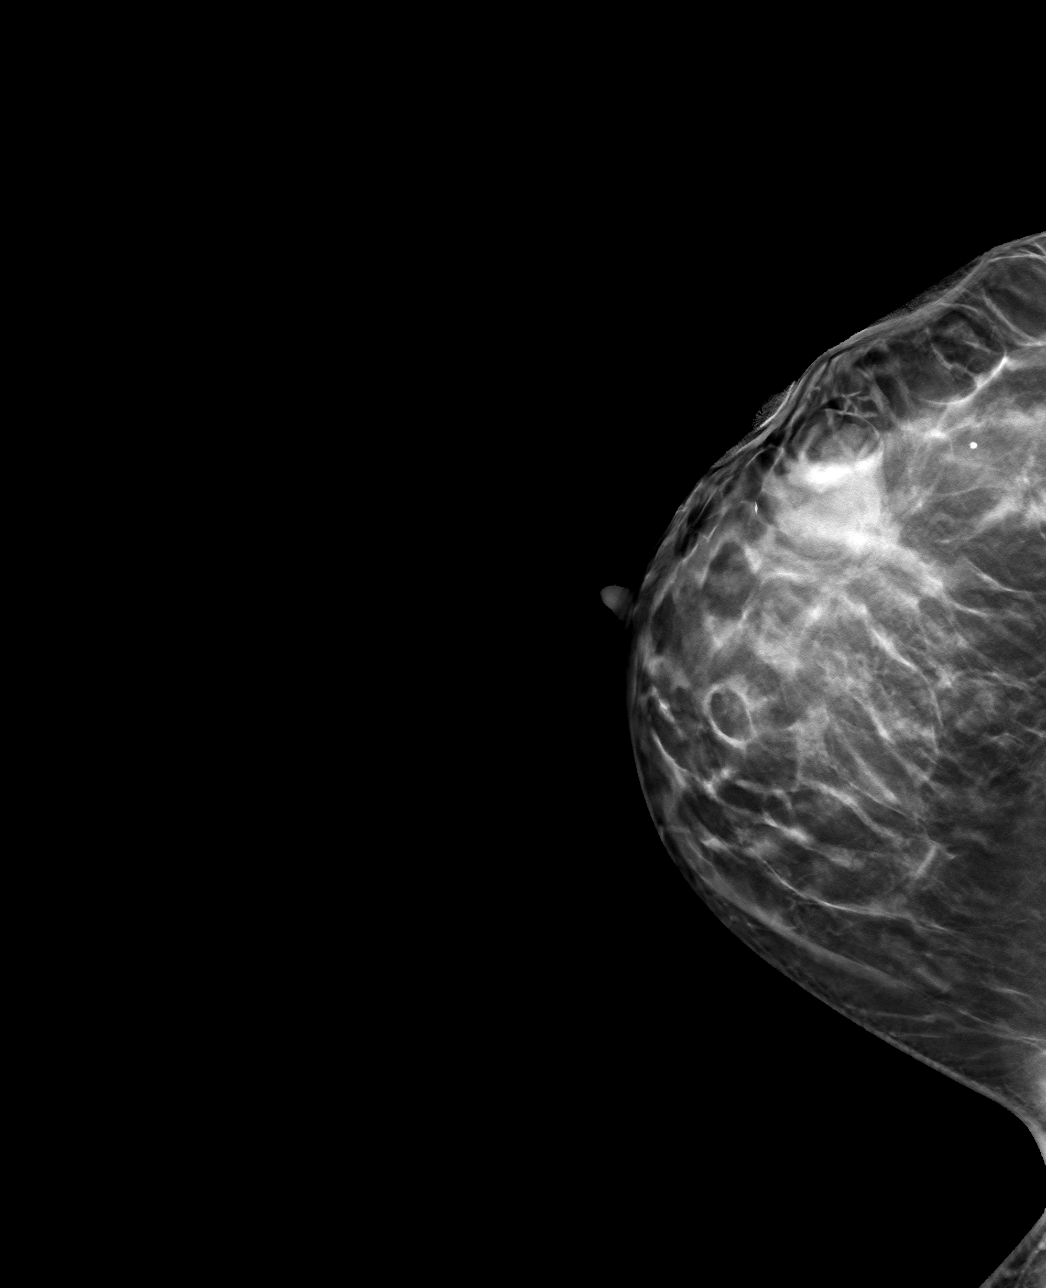

[4 of 12 positions shown; findings below may reference images not displayed]

FINDINGS: 3D Mammographic images were obtained following MRI guided biopsy of
2 areas of asymmetric right breast enhancement. The dumbbell shaped
biopsy clip, localizing the more anterior extent of the abnormal
enhancement, and the cylinder shaped biopsy clip, localizing the
more posterior aspect of the abnormal enhancement, are in the
appropriate locations.
IMPRESSION: Appropriate positioning of the dumbbell and cylinder shaped biopsy
marking clips at the site of biopsy in the upper outer quadrant of
the right breast.

Final Assessment: Post Procedure Mammograms for Marker Placement

## 2021-05-02 IMAGING — MR MR BREAST BX W/ LOC DEV 1ST LEASION IMAGE BX SPEC MR GUIDE*R*
6 of 8 series · 30 of 48 positions shown · IV contrast (gadavist)
Comparison: Previous exams.
COMPARISON: Previous exams.

Addendum:
CLINICAL DATA: Patient presents for MRI guided core needle biopsy
of the anterior-posterior extents of a broad area of asymmetric
enhancement in the upper outer right breast. Patient has a history
of a right lumpectomy for breast carcinoma.

EXAM:
MRI GUIDED CORE NEEDLE BIOPSY OF THE RIGHT BREAST: 2 MRI GUIDED
RIGHT BREAST BIOPSIES PERFORMED
TECHNIQUE: Multiplanar, multisequence MR imaging of the right breast was
performed both before and after administration of intravenous
contrast.
CONTRAST:  8 mL of Gadavist

[Series 2: fiducial unilateral · sagittal · 2.0mm · 1.33mm/px · 1 of 52 slices shown]
[im 1/52]
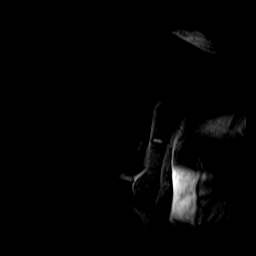

[Series 3: dynamic pre · axial · non-contrast · 1.3mm · 0.73mm/px · z∈[-116,+91]mm · 6 of 160 slices shown]
[im 1/160]
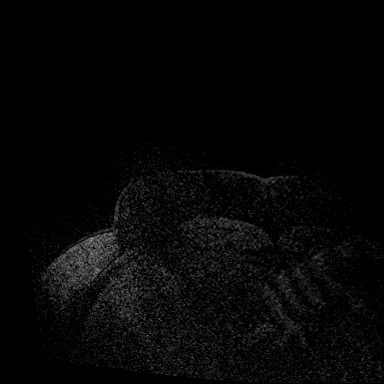
[im 32/160]
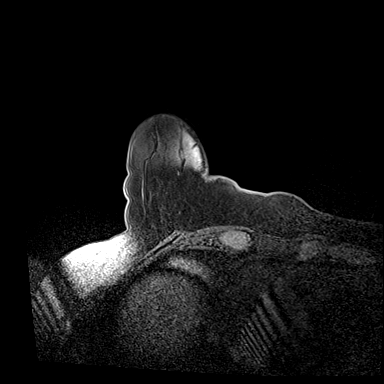
[im 64/160]
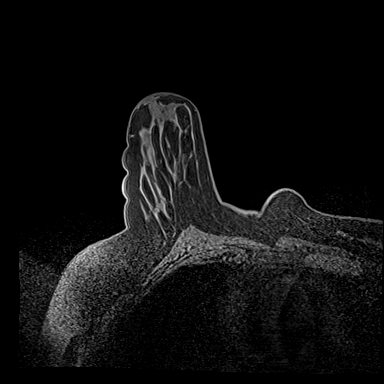
[im 96/160]
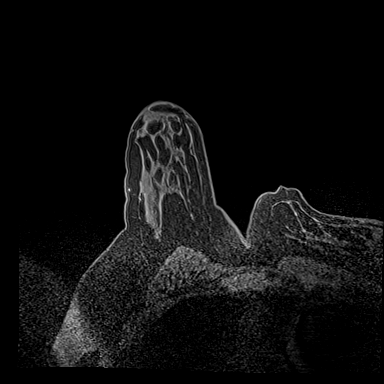
[im 128/160]
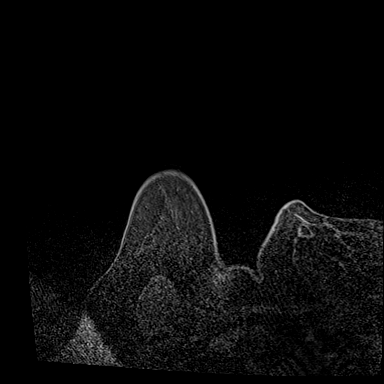
[im 160/160]
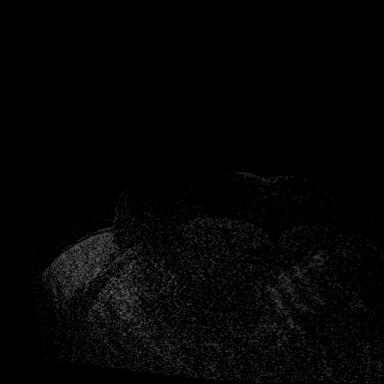

[Series 4: dynamic post 20 · axial · 1.3mm · 0.73mm/px · z∈[-116,+91]mm · 6 of 160 slices shown (1 of 2)]
[im 1/160]
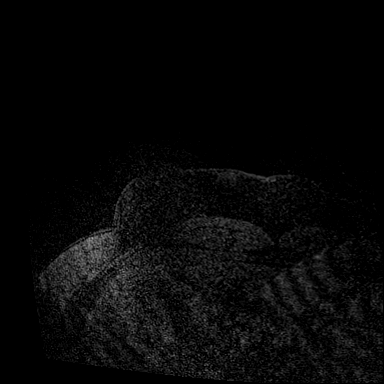
[im 32/160]
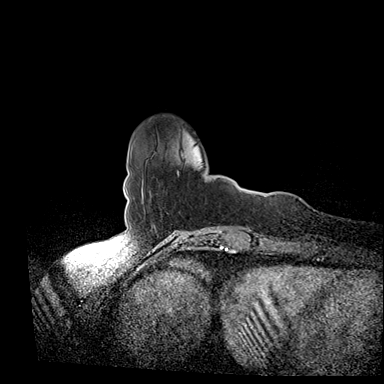
[im 64/160]
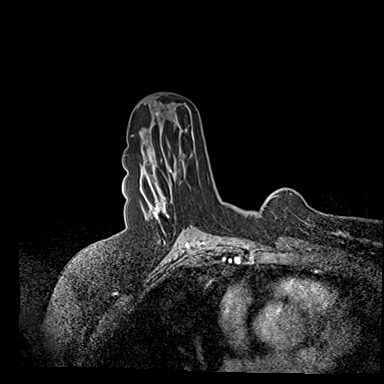
[im 96/160]
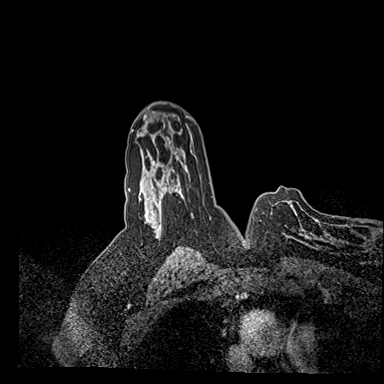
[im 128/160]
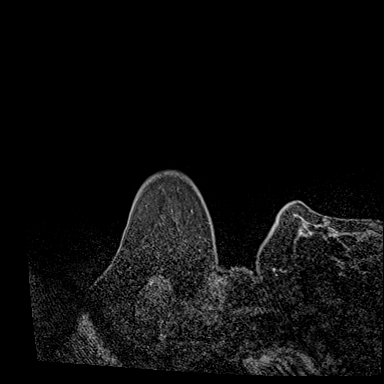
[im 160/160]
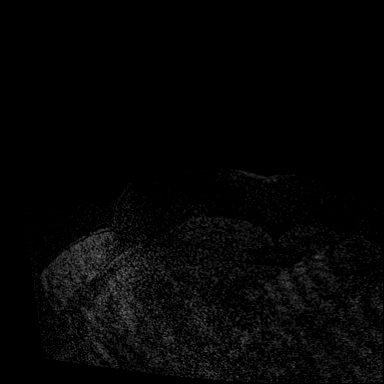

[Series 5: dynamic post 20 · axial · 1.3mm · 0.73mm/px · z∈[-116,+91]mm · 7 of 160 slices shown (2 of 2)]
[im 1/160]
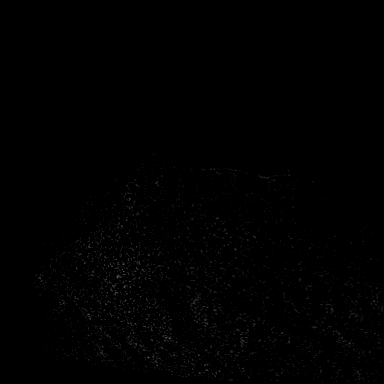
[im 27/160]
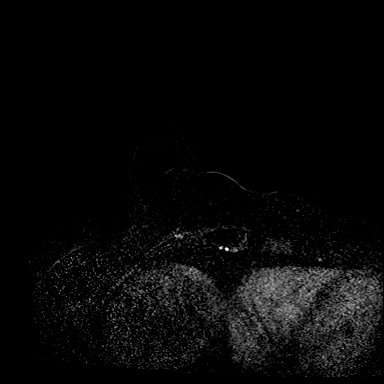
[im 54/160]
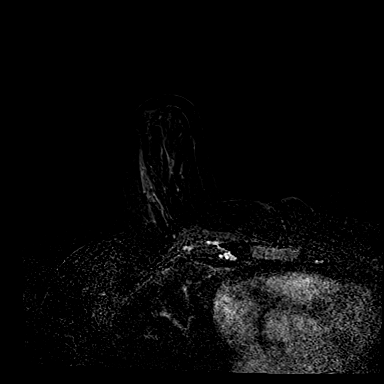
[im 80/160]
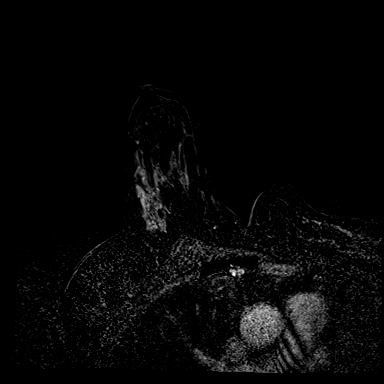
[im 107/160]
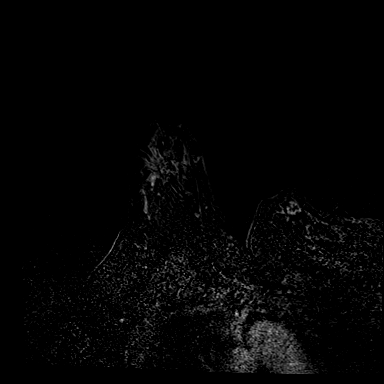
[im 133/160]
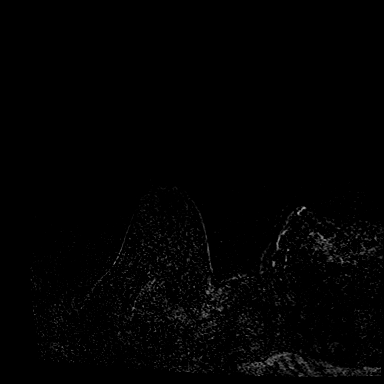
[im 160/160]
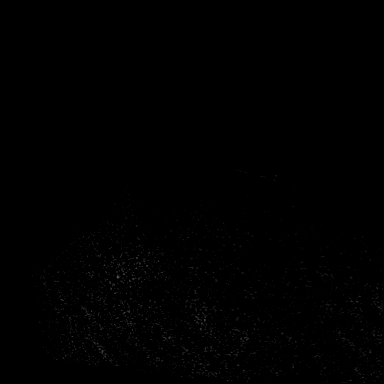

[Series 6: dynamic post 3 · axial · 1.3mm · 0.73mm/px · z∈[-116,+91]mm · 7 of 160 slices shown (1 of 2)]
[im 1/160]
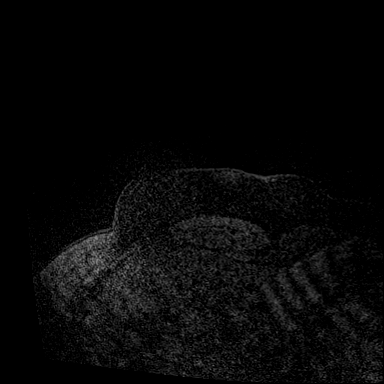
[im 27/160]
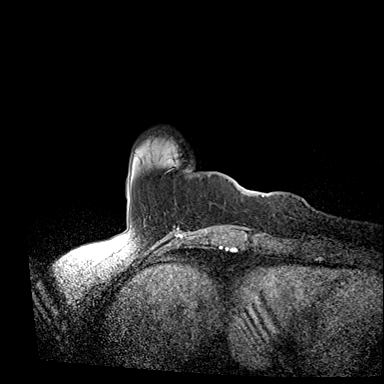
[im 54/160]
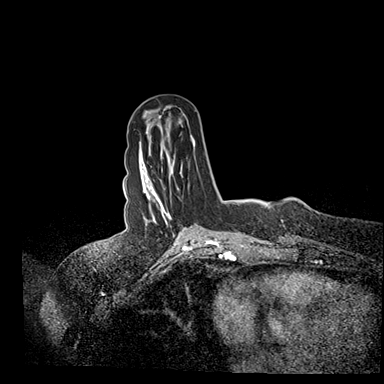
[im 80/160]
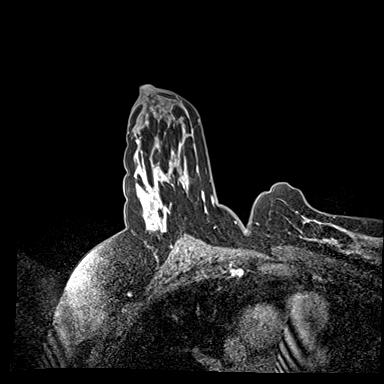
[im 107/160]
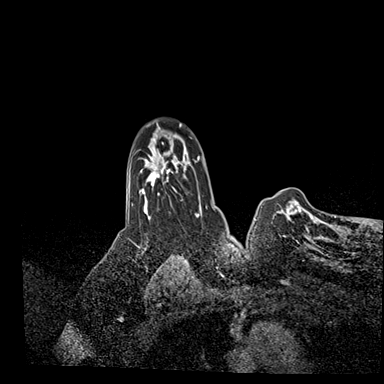
[im 133/160]
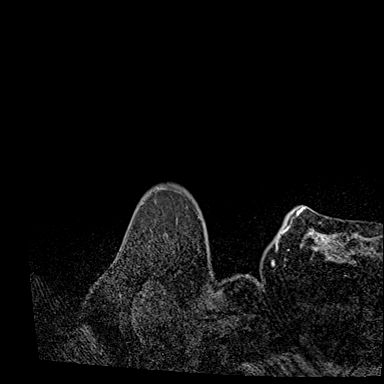
[im 160/160]
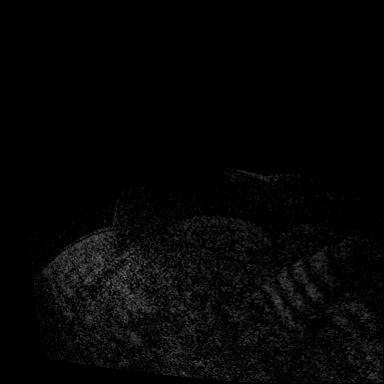

[Series 7: dynamic post 3 · axial · 1.3mm · 0.73mm/px · z∈[-116,-47]mm · 3 of 160 slices shown (2 of 2)]
[im 1/160]
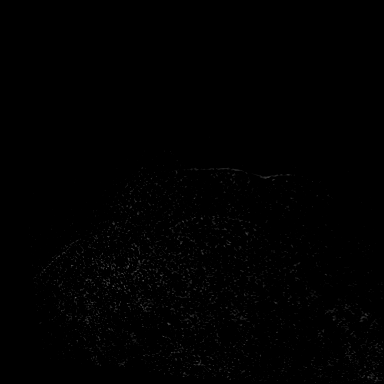
[im 27/160]
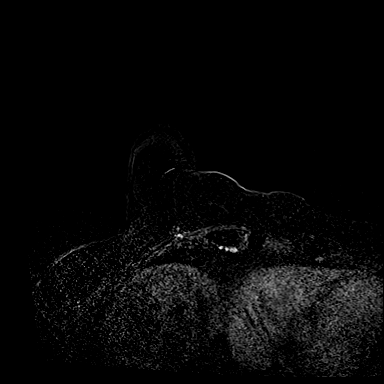
[im 54/160]
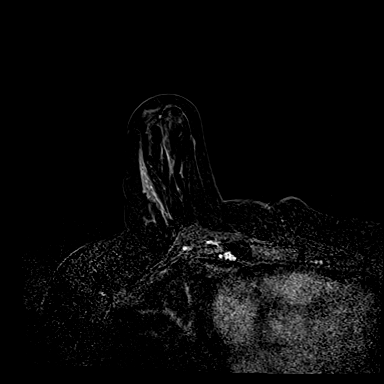

[30 of 48 positions shown; findings below may reference images not displayed]

FINDINGS: I met with the patient, and we discussed the procedure of MRI guided
biopsy, including risks, benefits, and alternatives. Specifically,
we discussed the risks of infection, bleeding, tissue injury, clip
migration, and inadequate sampling. Informed, written consent was
given. The usual time out protocol was performed immediately prior
to the procedure.

Biopsy #1: UOQ, anterior.

Using sterile technique, 1% Lidocaine, MRI guidance, and a 9 gauge
vacuum assisted device, biopsy was performed of anterior aspect of
the area asymmetric enhancement using a lateral approach. At the
conclusion of the procedure, a barbell shaped tissue marker clip was
deployed into the biopsy cavity.

Biopsy #2: UOQ, posterior.

Using sterile technique, 1% Lidocaine, MRI guidance, and a 9 gauge
vacuum assisted device, biopsy was performed of more posterior
aspect of the right breast asymmetric enhancement using a lateral
approach. At the conclusion of the procedure, a cylinder shaped
tissue marker clip was deployed into the biopsy cavity.

Follow-up 2-view mammogram was performed and dictated separately.
IMPRESSION: MRI guided biopsy of the right breast. The anterior and posterior
aspects asymmetric upper-outer quadrant right breast enhancement
were sampled. No apparent complications.

ADDENDUM:
Pathology revealed FOCAL FIBROCYSTIC CHANGES AND FIBROSCLEROSIS WITH
MINUTE FOCI OF INTRALUMINAL MICROCALCIFICATION AND WITHOUT
EPITHELIAL HYPERPLASIA of the RIGHT breast, upper outer quadrant,
anterior, (barbell clip). This was found to be concordant by Dr.
TEMENOUJKA.

Pathology revealed BENIGN BREAST TISSUE WITH FOCAL PERIDUCTAL
FIBROSIS of the RIGHT breast, upper outer quadrant, posterior,
(cylinder clip). This was found to be concordant by Dr. TEMENOUJKA
TEMENOUJKA.

Pathology results were discussed with the patient by telephone. The
patient reported doing well after the biopsies with tenderness and
oozing at the sites. Post biopsy instructions and care were reviewed
and questions were answered. The patient was encouraged to call The
direct phone number was provided.

The patient was instructed to return for a bilateral breast MRI in 6
months, per protocol, and to continue with annual screening
mammography in [DATE].

Pathology results reported by TEMENOUJKA, RN on [DATE].

*** End of Addendum ***
FINDINGS: I met with the patient, and we discussed the procedure of MRI guided
biopsy, including risks, benefits, and alternatives. Specifically,
we discussed the risks of infection, bleeding, tissue injury, clip
migration, and inadequate sampling. Informed, written consent was
given. The usual time out protocol was performed immediately prior
to the procedure.

Biopsy #1: UOQ, anterior.

Using sterile technique, 1% Lidocaine, MRI guidance, and a 9 gauge
vacuum assisted device, biopsy was performed of anterior aspect of
the area asymmetric enhancement using a lateral approach. At the
conclusion of the procedure, a barbell shaped tissue marker clip was
deployed into the biopsy cavity.

Biopsy #2: UOQ, posterior.

Using sterile technique, 1% Lidocaine, MRI guidance, and a 9 gauge
vacuum assisted device, biopsy was performed of more posterior
aspect of the right breast asymmetric enhancement using a lateral
approach. At the conclusion of the procedure, a cylinder shaped
tissue marker clip was deployed into the biopsy cavity.

Follow-up 2-view mammogram was performed and dictated separately.
IMPRESSION: MRI guided biopsy of the right breast. The anterior and posterior
aspects asymmetric upper-outer quadrant right breast enhancement
were sampled. No apparent complications.

## 2021-05-02 MED ORDER — GADOBUTROL 1 MMOL/ML IV SOLN
8.0000 mL | Freq: Once | INTRAVENOUS | Status: AC | PRN
  Administered 2021-05-02: 8 mL via INTRAVENOUS

## 2021-05-08 ENCOUNTER — Other Ambulatory Visit: Payer: BC Managed Care – PPO

## 2021-06-16 DIAGNOSIS — Z01419 Encounter for gynecological examination (general) (routine) without abnormal findings: Secondary | ICD-10-CM | POA: Diagnosis not present

## 2021-06-16 DIAGNOSIS — Z6826 Body mass index (BMI) 26.0-26.9, adult: Secondary | ICD-10-CM | POA: Diagnosis not present

## 2021-07-01 DIAGNOSIS — N83202 Unspecified ovarian cyst, left side: Secondary | ICD-10-CM | POA: Diagnosis not present

## 2021-10-06 DIAGNOSIS — F3342 Major depressive disorder, recurrent, in full remission: Secondary | ICD-10-CM | POA: Diagnosis not present

## 2021-10-06 DIAGNOSIS — F900 Attention-deficit hyperactivity disorder, predominantly inattentive type: Secondary | ICD-10-CM | POA: Diagnosis not present

## 2021-10-06 DIAGNOSIS — F411 Generalized anxiety disorder: Secondary | ICD-10-CM | POA: Diagnosis not present

## 2021-10-15 ENCOUNTER — Encounter: Payer: Self-pay | Admitting: Podiatry

## 2021-10-15 ENCOUNTER — Ambulatory Visit (INDEPENDENT_AMBULATORY_CARE_PROVIDER_SITE_OTHER): Payer: BC Managed Care – PPO | Admitting: Podiatry

## 2021-10-15 ENCOUNTER — Ambulatory Visit (INDEPENDENT_AMBULATORY_CARE_PROVIDER_SITE_OTHER): Payer: BC Managed Care – PPO

## 2021-10-15 DIAGNOSIS — M7672 Peroneal tendinitis, left leg: Secondary | ICD-10-CM

## 2021-10-15 DIAGNOSIS — M722 Plantar fascial fibromatosis: Secondary | ICD-10-CM

## 2021-10-15 MED ORDER — TRIAMCINOLONE ACETONIDE 10 MG/ML IJ SUSP
10.0000 mg | Freq: Once | INTRAMUSCULAR | Status: AC
  Administered 2021-10-15: 10 mg

## 2021-10-15 NOTE — Progress Notes (Signed)
Subjective:   Patient ID: Julia [name], female   DOB: 46 y.o.   MRN: [id number]   HPI Patient presents stating that she has developed pain more in the outside of her foot now over the last couple months and the heel and the arch can still bother her some but not to the same degree and she did have 4 to 6 months of complete relief   ROS      Objective:  Physical Exam  Neuro vascular status intact with discomfort now more around the peroneal tendon insertion base of fifth metatarsal left with mild discomfort in the plantar fascia arch     Assessment:  Difficult to say as to whether or not some of this may be compensation for heel pain which still occurs or if it is a primary perineal inflammatory condition but it is sore     Plan:  Reviewed condition organ to focus on the perineal and I did do sterile prep and injected the insertion perineal 3 mg Dexasone Kenalog 5 mg Xylocaine after discussing risk I discussed the possibility for shockwave PRP injection of the plantar fascia if symptoms persist but may also just require or consider orthotic devices before we would go any further

## 2021-11-24 ENCOUNTER — Other Ambulatory Visit: Payer: Self-pay | Admitting: Obstetrics and Gynecology

## 2021-11-24 DIAGNOSIS — Z803 Family history of malignant neoplasm of breast: Secondary | ICD-10-CM

## 2021-11-26 ENCOUNTER — Ambulatory Visit: Payer: BC Managed Care – PPO | Admitting: Podiatry

## 2021-12-15 ENCOUNTER — Ambulatory Visit
Admission: RE | Admit: 2021-12-15 | Discharge: 2021-12-15 | Disposition: A | Discharge status: Home / Self Care | Payer: BC Managed Care – PPO | Source: Ambulatory Visit | Attending: Obstetrics and Gynecology | Admitting: Obstetrics and Gynecology

## 2021-12-15 DIAGNOSIS — Z803 Family history of malignant neoplasm of breast: Secondary | ICD-10-CM

## 2021-12-15 DIAGNOSIS — N6489 Other specified disorders of breast: Secondary | ICD-10-CM | POA: Diagnosis not present

## 2021-12-15 MED ORDER — GADOPICLENOL 0.5 MMOL/ML IV SOLN
8.0000 mL | Freq: Once | INTRAVENOUS | Status: AC | PRN
  Administered 2021-12-15: 8 mL via INTRAVENOUS

## 2022-09-25 ENCOUNTER — Other Ambulatory Visit: Payer: Self-pay | Admitting: Obstetrics and Gynecology

## 2022-09-25 DIAGNOSIS — R928 Other abnormal and inconclusive findings on diagnostic imaging of breast: Secondary | ICD-10-CM

## 2022-12-11 ENCOUNTER — Other Ambulatory Visit: Payer: Self-pay | Admitting: Obstetrics and Gynecology

## 2022-12-11 DIAGNOSIS — Z803 Family history of malignant neoplasm of breast: Secondary | ICD-10-CM

## 2023-02-25 ENCOUNTER — Other Ambulatory Visit (HOSPITAL_COMMUNITY): Payer: Self-pay

## 2023-02-25 MED ORDER — AMPHETAMINE-DEXTROAMPHET ER 30 MG PO CP24
30.0000 mg | ORAL_CAPSULE | Freq: Every morning | ORAL | 0 refills | Status: DC
  Filled 2023-02-25: qty 30, 30d supply, fill #0

## 2023-02-25 MED ORDER — AMPHETAMINE-DEXTROAMPHETAMINE 30 MG PO TABS
30.0000 mg | ORAL_TABLET | Freq: Every day | ORAL | 0 refills | Status: DC
  Filled 2023-02-25: qty 30, 30d supply, fill #0

## 2023-05-29 ENCOUNTER — Emergency Department (HOSPITAL_BASED_OUTPATIENT_CLINIC_OR_DEPARTMENT_OTHER)

## 2023-05-29 ENCOUNTER — Other Ambulatory Visit: Payer: Self-pay

## 2023-05-29 ENCOUNTER — Encounter (HOSPITAL_BASED_OUTPATIENT_CLINIC_OR_DEPARTMENT_OTHER): Payer: Self-pay

## 2023-05-29 ENCOUNTER — Inpatient Hospital Stay (HOSPITAL_BASED_OUTPATIENT_CLINIC_OR_DEPARTMENT_OTHER)
Admission: EM | Admit: 2023-05-29 | Discharge: 2023-06-01 | DRG: 603 | Disposition: A | Discharge status: Home / Self Care | Attending: Internal Medicine | Admitting: Internal Medicine

## 2023-05-29 DIAGNOSIS — Z79899 Other long term (current) drug therapy: Secondary | ICD-10-CM | POA: Diagnosis not present

## 2023-05-29 DIAGNOSIS — Z8261 Family history of arthritis: Secondary | ICD-10-CM

## 2023-05-29 DIAGNOSIS — Z8659 Personal history of other mental and behavioral disorders: Secondary | ICD-10-CM | POA: Diagnosis not present

## 2023-05-29 DIAGNOSIS — F429 Obsessive-compulsive disorder, unspecified: Secondary | ICD-10-CM | POA: Diagnosis present

## 2023-05-29 DIAGNOSIS — F909 Attention-deficit hyperactivity disorder, unspecified type: Secondary | ICD-10-CM | POA: Diagnosis present

## 2023-05-29 DIAGNOSIS — K219 Gastro-esophageal reflux disease without esophagitis: Secondary | ICD-10-CM | POA: Diagnosis present

## 2023-05-29 DIAGNOSIS — F419 Anxiety disorder, unspecified: Secondary | ICD-10-CM | POA: Diagnosis present

## 2023-05-29 DIAGNOSIS — L03211 Cellulitis of face: Secondary | ICD-10-CM | POA: Diagnosis present

## 2023-05-29 DIAGNOSIS — E8721 Acute metabolic acidosis: Secondary | ICD-10-CM | POA: Diagnosis present

## 2023-05-29 DIAGNOSIS — D649 Anemia, unspecified: Secondary | ICD-10-CM | POA: Diagnosis present

## 2023-05-29 DIAGNOSIS — G40209 Localization-related (focal) (partial) symptomatic epilepsy and epileptic syndromes with complex partial seizures, not intractable, without status epilepticus: Secondary | ICD-10-CM | POA: Diagnosis present

## 2023-05-29 DIAGNOSIS — Z881 Allergy status to other antibiotic agents status: Secondary | ICD-10-CM

## 2023-05-29 DIAGNOSIS — L0201 Cutaneous abscess of face: Secondary | ICD-10-CM | POA: Diagnosis present

## 2023-05-29 DIAGNOSIS — Z823 Family history of stroke: Secondary | ICD-10-CM | POA: Diagnosis not present

## 2023-05-29 DIAGNOSIS — K5909 Other constipation: Secondary | ICD-10-CM | POA: Diagnosis present

## 2023-05-29 DIAGNOSIS — Z833 Family history of diabetes mellitus: Secondary | ICD-10-CM

## 2023-05-29 DIAGNOSIS — Z885 Allergy status to narcotic agent status: Secondary | ICD-10-CM | POA: Diagnosis not present

## 2023-05-29 DIAGNOSIS — Z8249 Family history of ischemic heart disease and other diseases of the circulatory system: Secondary | ICD-10-CM | POA: Diagnosis not present

## 2023-05-29 DIAGNOSIS — E876 Hypokalemia: Secondary | ICD-10-CM | POA: Diagnosis not present

## 2023-05-29 DIAGNOSIS — Z808 Family history of malignant neoplasm of other organs or systems: Secondary | ICD-10-CM | POA: Diagnosis not present

## 2023-05-29 DIAGNOSIS — Z79890 Hormone replacement therapy (postmenopausal): Secondary | ICD-10-CM

## 2023-05-29 DIAGNOSIS — Z83719 Family history of colon polyps, unspecified: Secondary | ICD-10-CM

## 2023-05-29 LAB — CBC WITH DIFFERENTIAL/PLATELET
Abs Immature Granulocytes: 0.06 10*3/uL (ref 0.00–0.07)
Basophils Absolute: 0 10*3/uL (ref 0.0–0.1)
Basophils Relative: 0 %
Eosinophils Absolute: 0.2 10*3/uL (ref 0.0–0.5)
Eosinophils Relative: 1 %
HCT: 35.5 % — ABNORMAL LOW (ref 36.0–46.0)
Hemoglobin: 11.9 g/dL — ABNORMAL LOW (ref 12.0–15.0)
Immature Granulocytes: 0 %
Lymphocytes Relative: 13 %
Lymphs Abs: 1.9 10*3/uL (ref 0.7–4.0)
MCH: 31.6 pg (ref 26.0–34.0)
MCHC: 33.5 g/dL (ref 30.0–36.0)
MCV: 94.4 fL (ref 80.0–100.0)
Monocytes Absolute: 1.3 10*3/uL — ABNORMAL HIGH (ref 0.1–1.0)
Monocytes Relative: 9 %
Neutro Abs: 11.4 10*3/uL — ABNORMAL HIGH (ref 1.7–7.7)
Neutrophils Relative %: 77 %
Platelets: 215 10*3/uL (ref 150–400)
RBC: 3.76 MIL/uL — ABNORMAL LOW (ref 3.87–5.11)
RDW: 12.8 % (ref 11.5–15.5)
WBC: 14.8 10*3/uL — ABNORMAL HIGH (ref 4.0–10.5)
nRBC: 0 % (ref 0.0–0.2)

## 2023-05-29 LAB — COMPREHENSIVE METABOLIC PANEL WITH GFR
ALT: 5 U/L (ref 0–44)
AST: 15 U/L (ref 15–41)
Albumin: 3.8 g/dL (ref 3.5–5.0)
Alkaline Phosphatase: 76 U/L (ref 38–126)
Anion gap: 11 (ref 5–15)
BUN: 10 mg/dL (ref 6–20)
CO2: 20 mmol/L — ABNORMAL LOW (ref 22–32)
Calcium: 8.8 mg/dL — ABNORMAL LOW (ref 8.9–10.3)
Chloride: 107 mmol/L (ref 98–111)
Creatinine, Ser: 0.84 mg/dL (ref 0.44–1.00)
GFR, Estimated: >60 mL/min (ref >60)
Glucose, Bld: 81 mg/dL (ref 70–99)
Potassium: 3.5 mmol/L (ref 3.5–5.1)
Sodium: 138 mmol/L (ref 135–145)
Total Bilirubin: 0.4 mg/dL (ref 0.0–1.2)
Total Protein: 6 g/dL — ABNORMAL LOW (ref 6.5–8.1)

## 2023-05-29 MED ORDER — KETOROLAC TROMETHAMINE 30 MG/ML IJ SOLN
30.0000 mg | Freq: Four times a day (QID) | INTRAMUSCULAR | Status: DC | PRN
  Administered 2023-05-29: 30 mg via INTRAVENOUS
  Filled 2023-05-29: qty 1

## 2023-05-29 MED ORDER — TOPIRAMATE 100 MG PO TABS
300.0000 mg | ORAL_TABLET | Freq: Every day | ORAL | Status: DC
  Administered 2023-05-29 – 2023-05-31 (×3): 300 mg via ORAL
  Filled 2023-05-29 (×3): qty 3

## 2023-05-29 MED ORDER — FENTANYL CITRATE PF 50 MCG/ML IJ SOSY
50.0000 ug | PREFILLED_SYRINGE | Freq: Once | INTRAMUSCULAR | Status: AC
  Administered 2023-05-29: 50 ug via INTRAVENOUS
  Filled 2023-05-29: qty 1

## 2023-05-29 MED ORDER — FENTANYL CITRATE PF 50 MCG/ML IJ SOSY
50.0000 ug | PREFILLED_SYRINGE | Freq: Once | INTRAMUSCULAR | Status: AC | PRN
  Administered 2023-05-29: 50 ug via INTRAVENOUS
  Filled 2023-05-29: qty 1

## 2023-05-29 MED ORDER — SODIUM CHLORIDE 0.9 % IV SOLN
3.0000 g | Freq: Once | INTRAVENOUS | Status: AC
Start: 2023-05-29 — End: 2023-05-29
  Administered 2023-05-29: 3 g via INTRAVENOUS

## 2023-05-29 MED ORDER — FLUOXETINE HCL 20 MG PO CAPS
60.0000 mg | ORAL_CAPSULE | Freq: Every day | ORAL | Status: DC
  Administered 2023-05-29 – 2023-05-31 (×3): 60 mg via ORAL
  Filled 2023-05-29 (×3): qty 3

## 2023-05-29 MED ORDER — ENOXAPARIN SODIUM 40 MG/0.4ML IJ SOSY
40.0000 mg | PREFILLED_SYRINGE | INTRAMUSCULAR | Status: DC
  Administered 2023-05-29 – 2023-05-31 (×3): 40 mg via SUBCUTANEOUS
  Filled 2023-05-29 (×3): qty 0.4

## 2023-05-29 MED ORDER — VANCOMYCIN HCL 1500 MG/300ML IV SOLN
1500.0000 mg | Freq: Once | INTRAVENOUS | Status: AC
Start: 2023-05-29 — End: 2023-05-29
  Administered 2023-05-29: 1500 mg via INTRAVENOUS
  Filled 2023-05-29: qty 300

## 2023-05-29 MED ORDER — KETOROLAC TROMETHAMINE 30 MG/ML IJ SOLN
30.0000 mg | Freq: Four times a day (QID) | INTRAMUSCULAR | Status: AC
  Administered 2023-05-29 – 2023-05-30 (×3): 30 mg via INTRAVENOUS
  Filled 2023-05-29 (×3): qty 1

## 2023-05-29 MED ORDER — IOHEXOL 300 MG/ML  SOLN
75.0000 mL | Freq: Once | INTRAMUSCULAR | Status: AC | PRN
  Administered 2023-05-29: 75 mL via INTRAVENOUS

## 2023-05-29 MED ORDER — ACETAMINOPHEN 325 MG PO TABS
650.0000 mg | ORAL_TABLET | Freq: Four times a day (QID) | ORAL | Status: DC | PRN
  Administered 2023-05-30: 650 mg via ORAL
  Filled 2023-05-29: qty 2

## 2023-05-29 MED ORDER — BUPROPION HCL ER (XL) 300 MG PO TB24
300.0000 mg | ORAL_TABLET | Freq: Every day | ORAL | Status: DC
  Administered 2023-05-29 – 2023-05-30 (×2): 300 mg via ORAL
  Filled 2023-05-29 (×2): qty 1

## 2023-05-29 MED ORDER — VANCOMYCIN HCL IN DEXTROSE 1-5 GM/200ML-% IV SOLN
1000.0000 mg | Freq: Two times a day (BID) | INTRAVENOUS | Status: DC
  Administered 2023-05-30 – 2023-05-31 (×3): 1000 mg via INTRAVENOUS
  Filled 2023-05-29 (×3): qty 200

## 2023-05-29 MED ORDER — ACETAMINOPHEN 650 MG RE SUPP: 650.0000 mg | Freq: Four times a day (QID) | RECTAL | Status: DC | PRN

## 2023-05-29 MED ORDER — FENTANYL CITRATE PF 50 MCG/ML IJ SOSY: 25.0000 ug | PREFILLED_SYRINGE | INTRAMUSCULAR | Status: DC | PRN

## 2023-05-29 MED ORDER — ONDANSETRON HCL 4 MG PO TABS
4.0000 mg | ORAL_TABLET | Freq: Four times a day (QID) | ORAL | Status: DC | PRN
Start: 2023-05-29 — End: 2023-06-01

## 2023-05-29 MED ORDER — CEFAZOLIN SODIUM-DEXTROSE 2-4 GM/100ML-% IV SOLN: 2.0000 g | Freq: Three times a day (TID) | INTRAVENOUS | Status: DC

## 2023-05-29 MED ORDER — ONDANSETRON HCL 4 MG/2ML IJ SOLN: 4.0000 mg | Freq: Four times a day (QID) | INTRAMUSCULAR | Status: DC | PRN

## 2023-05-29 MED ORDER — LACTATED RINGERS IV BOLUS
1000.0000 mL | Freq: Once | INTRAVENOUS | Status: AC
  Administered 2023-05-29: 1000 mL via INTRAVENOUS

## 2023-05-29 MED ORDER — SODIUM CHLORIDE 0.9 % IV SOLN
1.0000 g | INTRAVENOUS | Status: DC
  Administered 2023-05-29 – 2023-05-31 (×3): 1 g via INTRAVENOUS
  Filled 2023-05-29 (×3): qty 10

## 2023-05-29 MED ORDER — POLYETHYLENE GLYCOL 3350 17 G PO PACK: 17.0000 g | PACK | Freq: Every day | ORAL | Status: DC | PRN

## 2023-05-29 NOTE — Progress Notes (Signed)
 Pharmacy Antibiotic Note  Julia [name] is a 48 y.o. female admitted on 05/29/2023 with cellulitis.  Pharmacy has been consulted for vancomycin dosing.  Plan: Vanc 1500mg  IV x 1 then 1g IV q12 - goal AUC 400-550  Height: 6\' 1"  (185.4 cm) Weight: 78.5 kg (173 lb) IBW/kg (Calculated) : 75.4  Temp (24hrs), Avg:98 F (36.7 C), Min:97.8 F (36.6 C), Max:98.1 F (36.7 C)  Recent Labs  Lab 05/29/23 0536  WBC 14.8*  CREATININE 0.84    Estimated Creatinine Clearance: 100.7 mL/min (by C-G formula based on SCr of 0.84 mg/dL).    Allergies  Allergen Reactions   Ciprofloxacin Hives   Percocet [Oxycodone-Acetaminophen ] Nausea Only      Thank you for allowing pharmacy to be a part of this patient's care.  Bernett Brill 05/29/2023 12:37 PM

## 2023-05-29 NOTE — ED Notes (Signed)
 Julia [name] at [facility] for transport

## 2023-05-29 NOTE — ED Triage Notes (Addendum)
 Patient picking at a pimple a few days ago. Presents with swelling to the right side of her face. States it is painful 7/10. Also has purulent drainage coming from the site of the pimple. States she cannot eat because she cannot open her mouth all the way. States she can feel the pain on the right side of her neck.

## 2023-05-29 NOTE — ED Provider Notes (Signed)
 Patient handed off to me at 7 AM awaiting CT of her face.  Concerns for facial cellulitis.  She does have a little bit of trismus.  She does have skin wound or pimple that seems to be the nidus for the inflammation.  No recent dental work.  No major medical problems.  She has a white count of 14 but no fever.  She been given IV antibiotics.  Overall CT scan does show broad-based right facial cellulitis with regional soft tissue edema and inflammation but no abscess or gas.  There is some small reactive lymph nodes.  Otherwise CT is unremarkable.  Given the extensive swelling and minor trismus I think it is reasonable to observe her overnight for further IV antibiotics.  Patient to be admitted to medicine service for further care.  This chart was dictated using voice recognition software.  Despite best efforts to proofread,  errors can occur which can change the documentation meaning.    Lowery Rue, DO 05/29/23 [phone]

## 2023-05-29 NOTE — ED Provider Notes (Signed)
 [place] EMERGENCY DEPARTMENT AT [facility] Provider Note   CSN: [id number] Arrival date & time: 05/29/23  0430     History  Chief Complaint  Patient presents with   Facial Swelling    Julia [name] is a 48 y.o. female.  HPI   48 year old female presenting to the Emergency Department with 2 days of facial swelling.  The patient states that symptoms started as a pimple along her right jawline.  The surrounding erythema grew in the area began to swell.  It has subsequently ruptured and is draining pus.  She states that she has been having worsening pain and swelling, having some difficulty opening her mouth.  She denies any active dental pain or swelling.  She has had difficulty eating due to inability to open her mouth all the way.  She feels the pain radiating down her neck on the right side.  She denies any difficulty breathing.  She denies any tongue elevation.  Home Medications Prior to Admission medications   Medication Sig Start Date End Date Taking? Authorizing Provider  ALPRAZolam (XANAX) 1 MG tablet Take 1 mg by mouth daily as needed for anxiety. For anxiety    [provider]  amphetamine -dextroamphetamine  (ADDERALL XR) 30 MG 24 hr capsule Take 1 capsule (30 mg total) by mouth in the morning. 02/25/23     amphetamine -dextroamphetamine  (ADDERALL) 30 MG tablet Take 1 tablet by mouth daily. 02/25/23     buPROPion (WELLBUTRIN XL) 150 MG 24 hr tablet Take 1 tablet by mouth daily. 01/07/17   [provider]  dexmethylphenidate (FOCALIN) 10 MG tablet Take 20 mg by mouth 2 (two) times daily. 09/09/20   [provider]  diclofenac  (VOLTAREN ) 75 MG EC tablet Take 1 tablet (75 mg total) by mouth 2 (two) times daily. 09/26/20   Brandt Cake, DPM  DYANAVEL  XR 20 MG CHER Take 1 tablet by mouth every morning. 10/07/21   [provider]  levothyroxine (SYNTHROID) 50 MCG tablet TAKE 1 TABLET BY MOUTH EVERY DAY IN THE MORNING ON EMPTY STOMACH FOR 30  DAYS 12/13/20   [provider]  pantoprazole  (PROTONIX ) 40 MG tablet TAKE 1 TABLET BY MOUTH EVERY DAY 04/17/19   Pyrtle, Amber Bail, MD  topiramate (TOPAMAX) 100 MG tablet Take 200 mg by mouth 2 (two) times daily.  11/28/15   [provider]      Allergies    Ciprofloxacin and Percocet [oxycodone-acetaminophen ]    Review of Systems   Review of Systems  All other systems reviewed and are negative.   Physical Exam Updated Vital Signs BP (!) 108/56   Pulse 77   Temp 97.8 F (36.6 C) (Oral)   Resp 17   Ht 6\' 1"  (1.854 m)   Wt 78.5 kg   SpO2 100%   BMI 22.82 kg/m  Physical Exam Vitals and nursing note reviewed.  Constitutional:      General: She is not in acute distress.    Appearance: She is well-developed.  HENT:     Head: Normocephalic and atraumatic.     Comments: Mild trismus, significant swelling and erythema along the right jawline, tenderness to palpation, no crepitus, appears to be a ruptured abscess draining purulence Eyes:     Conjunctiva/sclera: Conjunctivae normal.  Cardiovascular:     Rate and Rhythm: Normal rate and regular rhythm.     Heart sounds: No murmur heard. Pulmonary:     Effort: Pulmonary effort is normal. No respiratory distress.  Breath sounds: Normal breath sounds.  Abdominal:     Palpations: Abdomen is soft.     Tenderness: There is no abdominal tenderness.  Musculoskeletal:        General: No swelling.     Cervical back: Neck supple.  Skin:    General: Skin is warm and dry.     Capillary Refill: Capillary refill takes less than 2 seconds.  Neurological:     Mental Status: She is alert.  Psychiatric:        Mood and Affect: Mood normal.     ED Results / Procedures / Treatments   Labs (all labs ordered are listed, but only abnormal results are displayed) Labs Reviewed  CULTURE, BLOOD (ROUTINE X 2)  CULTURE, BLOOD (ROUTINE X 2)  CBC WITH DIFFERENTIAL/PLATELET  COMPREHENSIVE METABOLIC PANEL WITH GFR     EKG None  Radiology No results found.  Procedures Procedures    Medications Ordered in ED Medications  lactated ringers  bolus 1,000 mL (has no administration in time range)  fentaNYL  (SUBLIMAZE ) injection 50 mcg (has no administration in time range)  Ampicillin-Sulbactam (UNASYN) 3 g in sodium chloride  0.9 % 100 mL IVPB (has no administration in time range)    ED Course/ Medical Decision Making/ A&P                                 Medical Decision Making Amount and/or Complexity of Data Reviewed Labs: ordered. Radiology: ordered.  Risk Prescription drug management.    48 year old female presenting to the Emergency Department with 2 days of facial swelling.  The patient states that symptoms started as a pimple along her right jawline.  The surrounding erythema grew in the area began to swell.  It has subsequently ruptured and is draining pus.  She states that she has been having worsening pain and swelling, having some difficulty opening her mouth.  She denies any active dental pain or swelling.  She has had difficulty eating due to inability to open her mouth all the way.  She feels the pain radiating down her neck on the right side.  She denies any difficulty breathing.  She denies any tongue elevation.  On arrival, the patient was vitally stable, afebrile, not tachycardic or tachypneic, BP 108/56, saturating 100% on room air.  Physical exam revealed a likely ruptured abscess along the right jawline with associated facial cellulitis.  No evidence of Ludwig's Angina.  Lower concern for RPA.  Given extensive nature of swelling, will obtain CT imaging.   Patient given IV Unasyn, IV fluid bolus, IV fentanyl .  Signout given to Dr. Curatolo at 0700.   Final Clinical Impression(s) / ED Diagnoses Final diagnoses:  None    Rx / DC Orders ED Discharge Orders     None         Rosealee Concha, MD 05/29/23 [phone]

## 2023-05-29 NOTE — Plan of Care (Signed)

## 2023-05-29 NOTE — Progress Notes (Signed)
 Plan of Care Note for accepted transfer   Patient: Julia [name] MRN: [id number]   DOA: 05/29/2023  Facility requesting transfer: [facility]. Requesting Provider: Lowery Rue, DO. Reason for transfer: Facial cellulitis. Facility course:  48 year old female who presented to the emergency department due to facial cellulitis after complication of apnea.  She received LR 1000 mm bolus, Unasyn and fentanyl  50 mcg IVP.  White count is 14.8 and CT does not show any signs of an abscess or gas.  Plan of care: The patient is accepted for admission to Med-surg  unit, at [facility] to continue therapy with IV antibiotics.  Author: Danice Dural, MD 05/29/2023  Check [email or web address] for on-call coverage.  Nursing staff, Please call [facility] Admits & Consults System-Wide number on Amion as soon as patient's arrival, so appropriate admitting provider can evaluate the pt.

## 2023-05-29 NOTE — H&P (Signed)
 History and Physical    Patient: Julia [name] DOB: [birth year] DOA: 05/29/2023 DOS: the patient was seen and examined on 05/29/2023 PCP: Jeannine Milroy., MD  Patient coming from: Home  Chief Complaint:  Chief Complaint  Patient presents with   Facial Swelling   HPI: Julia [name] is a 48 y.o. female with medical history significant of acute posterior anal fissure, ADHD, anemia, anorexia, anxiety, chronic idiopathic constipation, depression, epilepsy, GERD, OCD who presented the emergency department due to facial swelling.  2 days ago she had a "pimple" in her face which she started to squeeze.  She took a sewing needle and punctured the area without hitting the needle or cleaning with alcohol prior to proceeding with that.  She has since then developed significant facial edema, erythema, calor and tenderness that has made it difficult to chew or open her mouth.  No airway compromise.  Earlier today she woke up sweating, but no fever.  She has also felt nauseous.  She denied rhinorrhea, sore throat, wheezing or hemoptysis.  No chest pain, palpitations, diaphoresis, PND, orthopnea or pitting edema of the lower extremities.  Has some nausea, but no abdominal pain, emesis, diarrhea, constipation, melena or hematochezia.  No flank pain, dysuria, frequency or hematuria.  No polyuria, polydipsia, polyphagia or blurred vision.   Lab work: CBC showed white count 14.8, hemoglobin 11.9 g/dL platelets 562.  CMP showed CO2 of 20 mmol/L with a normal anion gap.  Glucose, renal function and the rest of the electrolytes are normal after calcium correction.  Total protein was 6.0 g/dL, but the rest of the hepatic functions were normal.  Imaging: CT soft tissue neck with broadbase right facial cellulitis original soft tissue edema and inflammation with no abscess or gas.  Small reactive right level 1B lymph nodes.  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  H advanced cervical spine disc and endplate degeneration at C5-C6.  ED course: Initial vital signs were temperature 97.8 F, pulse 77, respiration 17, BP 108 over 56 mmHg and O2 sat 100% on room air.  The patient received Unasyn 3 g IVPB, fentanyl  50 mcg IVP and 1000 mL of LR bolus.   Review of Systems: As mentioned in the history of present illness. All other systems reviewed and are negative.  Past Medical History:  Diagnosis Date   Acute posterior anal fissure    ADHD (attention deficit hyperactivity disorder)    Anemia    Anorexia    history of eating disorder   Anxiety    Chronic idiopathic constipation    Depression    Epilepsy (HCC)    Family history of bladder cancer    Family history of breast cancer    Family history of ovarian cancer    GERD (gastroesophageal reflux disease)    Obsessive compulsive disorder    OCD (obsessive compulsive disorder)    Seizures (HCC) 05/17/14   Past Surgical History:  Procedure Laterality Date   BREAST LUMPECTOMY WITH RADIOACTIVE SEED LOCALIZATION Right 10/05/2019   Procedure: RIGHT BREAST LUMPECTOMY WITH RADIOACTIVE SEED LOCALIZATION;  Surgeon: Sim Dryer, MD;  Location: [facility];  Service: General;  Laterality: Right;   LIPOSUCTION TRUNK     TONSILLECTOMY     Social History:  reports that she has never smoked. She has never used smokeless tobacco. She reports current alcohol use of about 1.0 standard drink of alcohol per week. She reports that she does not  use drugs.  Allergies  Allergen Reactions   Ciprofloxacin Hives   Percocet [Oxycodone-Acetaminophen ] Nausea Only    Family History  Problem Relation Age of Onset   Parkinsonism Mother 92   Hypertension Father    Stroke Father    Lymphoma Father 71   Colon polyps Father    Colon polyps Brother        at least 3   Rheum arthritis Maternal Grandfather    Parkinsonism Maternal Grandfather    Diabetes Maternal Grandmother    Parkinsonism  Paternal Grandmother    Autism spectrum disorder Daughter        [id number] del   Skin cancer Maternal Aunt        BCC and SCC   Breast cancer Paternal Aunt 78   Ovarian cancer Paternal Aunt        dx in her early 37s   Bladder Cancer Paternal Aunt        dx in her 32s   Drug abuse Paternal Uncle    Drug abuse Paternal Aunt    Colon cancer Neg Hx    Esophageal cancer Neg Hx    Stomach cancer Neg Hx    Liver disease Neg Hx    Pancreatic cancer Neg Hx     Prior to Admission medications   Medication Sig Start Date End Date Taking? Authorizing Provider  ALPRAZolam (XANAX) 1 MG tablet Take 1 mg by mouth daily as needed for anxiety. For anxiety    [provider]  amphetamine -dextroamphetamine  (ADDERALL XR) 30 MG 24 hr capsule Take 1 capsule (30 mg total) by mouth in the morning. 02/25/23     amphetamine -dextroamphetamine  (ADDERALL) 30 MG tablet Take 1 tablet by mouth daily. 02/25/23     buPROPion (WELLBUTRIN XL) 150 MG 24 hr tablet Take 1 tablet by mouth daily. 01/07/17   [provider]  dexmethylphenidate (FOCALIN) 10 MG tablet Take 20 mg by mouth 2 (two) times daily. 09/09/20   [provider]  diclofenac  (VOLTAREN ) 75 MG EC tablet Take 1 tablet (75 mg total) by mouth 2 (two) times daily. 09/26/20   Brandt Cake, DPM  DYANAVEL  XR 20 MG CHER Take 1 tablet by mouth every morning. 10/07/21   [provider]  levothyroxine (SYNTHROID) 50 MCG tablet TAKE 1 TABLET BY MOUTH EVERY DAY IN THE MORNING ON EMPTY STOMACH FOR 30 DAYS 12/13/20   [provider]  pantoprazole  (PROTONIX ) 40 MG tablet TAKE 1 TABLET BY MOUTH EVERY DAY 04/17/19   Pyrtle, Amber Bail, MD  topiramate (TOPAMAX) 100 MG tablet Take 200 mg by mouth 2 (two) times daily.  11/28/15   [provider]    Physical Exam: Vitals:   05/29/23 0442 05/29/23 0815 05/29/23 0845 05/29/23 0956  BP: (!) 108/56 104/72 115/82 116/69  Pulse: 77 76 62 64  Resp: 17   18  Temp: 97.8 F (36.6 C)   98.1  F (36.7 C)  TempSrc: Oral   Oral  SpO2: 100% 100% 100% 100%  Weight:      Height:       Physical Exam Vitals and nursing note reviewed.  Constitutional:      General: She is awake. She is not in acute distress.    Appearance: Normal appearance. She is ill-appearing.  HENT:     Head: Normocephalic.     Nose: No rhinorrhea.     Mouth/Throat:     Mouth: Mucous membranes are moist.  Eyes:     General: No  scleral icterus.    Pupils: Pupils are equal, round, and reactive to light.  Neck:     Vascular: No JVD.  Cardiovascular:     Rate and Rhythm: Normal rate and regular rhythm.     Pulses: Normal pulses.     Heart sounds: S1 normal and S2 normal.  Pulmonary:     Effort: Pulmonary effort is normal.     Breath sounds: Normal breath sounds. No wheezing, rhonchi or rales.  Abdominal:     General: Bowel sounds are normal. There is no distension.     Palpations: Abdomen is soft.     Tenderness: There is no abdominal tenderness. There is no guarding.  Musculoskeletal:     Cervical back: Neck supple.     Right lower leg: No edema.     Left lower leg: No edema.  Skin:    General: Skin is warm and dry.     Findings: Erythema and lesion present.  Neurological:     General: No focal deficit present.     Mental Status: She is alert and oriented to person, place, and time.  Psychiatric:        Mood and Affect: Mood normal.        Behavior: Behavior normal. Behavior is cooperative.        Data Reviewed:  Results are pending, will review when available.  Assessment and Plan: Principal Problem:   Facial cellulitis Admit to MedSurg/inpatient. Analgesics as needed. Antiemetics as needed. Begin ceftriaxone  1 g IVPB every 24 hours. Begin vancomycin per pharmacy. Follow-up blood culture and sensitivity Follow CBC and CMP in a.m. Pantoprazole  40 mg IVP daily. Follow CBC, CMP in AM.  Active Problems:   Anxiety   OCD (obsessive compulsive disorder) Continue bupropion 300 mg  p.o. daily. Continue fluoxetine 60 mg p.o. bedtime.    ADHD (attention deficit hyperactivity disorder) Only using Adderall as needed.    Normocytic anemia Monitor hematocrit/hemoglobin.    Chronic constipation MiraLAX as needed.    Gastroesophageal reflux disease No significant symptoms at the moment. Antiacid, H2 blocker or PPI as needed.    Partial symptomatic epilepsy with complex partial seizures,    not intractable, without status epilepticus (HCC)  Continue topiramate 300 mg p.o. bedtime.    Advance Care Planning:   Code Status: Full Code   Consults:   Family Communication: Her spouse was at bedside.  Severity of Illness: The appropriate patient status for this patient is INPATIENT. Inpatient status is judged to be reasonable and necessary in order to provide the required intensity of service to ensure the patient's safety. The patient's presenting symptoms, physical exam findings, and initial radiographic and laboratory data in the context of their chronic comorbidities is felt to place them at high risk for further clinical deterioration. Furthermore, it is not anticipated that the patient will be medically stable for discharge from the hospital within 2 midnights of admission.   * I certify that at the point of admission it is my clinical judgment that the patient will require inpatient hospital care spanning beyond 2 midnights from the point of admission due to high intensity of service, high risk for further deterioration and high frequency of surveillance required.*  Author: Danice Dural, MD 05/29/2023 10:05 AM  For on call review [email or web address].   This document was prepared using Dragon voice recognition software and may contain some unintended transcription errors.

## 2023-05-30 DIAGNOSIS — L03211 Cellulitis of face: Secondary | ICD-10-CM | POA: Diagnosis not present

## 2023-05-30 LAB — CBC
HCT: 37.1 % (ref 36.0–46.0)
Hemoglobin: 11.8 g/dL — ABNORMAL LOW (ref 12.0–15.0)
MCH: 31.6 pg (ref 26.0–34.0)
MCHC: 31.8 g/dL (ref 30.0–36.0)
MCV: 99.5 fL (ref 80.0–100.0)
Platelets: 219 10*3/uL (ref 150–400)
RBC: 3.73 MIL/uL — ABNORMAL LOW (ref 3.87–5.11)
RDW: 12.7 % (ref 11.5–15.5)
WBC: 13.9 10*3/uL — ABNORMAL HIGH (ref 4.0–10.5)
nRBC: 0 % (ref 0.0–0.2)

## 2023-05-30 LAB — COMPREHENSIVE METABOLIC PANEL WITH GFR
ALT: 12 U/L (ref 0–44)
AST: 27 U/L (ref 15–41)
Albumin: 3 g/dL — ABNORMAL LOW (ref 3.5–5.0)
Alkaline Phosphatase: 68 U/L (ref 38–126)
Anion gap: 8 (ref 5–15)
BUN: 12 mg/dL (ref 6–20)
CO2: 18 mmol/L — ABNORMAL LOW (ref 22–32)
Calcium: 8.2 mg/dL — ABNORMAL LOW (ref 8.9–10.3)
Chloride: 112 mmol/L — ABNORMAL HIGH (ref 98–111)
Creatinine, Ser: 0.83 mg/dL (ref 0.44–1.00)
GFR, Estimated: >60 mL/min (ref >60)
Glucose, Bld: 84 mg/dL (ref 70–99)
Potassium: 3.7 mmol/L (ref 3.5–5.1)
Sodium: 138 mmol/L (ref 135–145)
Total Bilirubin: 0.5 mg/dL (ref 0.0–1.2)
Total Protein: 5.9 g/dL — ABNORMAL LOW (ref 6.5–8.1)

## 2023-05-30 LAB — HIV ANTIBODY (ROUTINE TESTING W REFLEX): HIV Screen 4th Generation wRfx: NONREACTIVE

## 2023-05-30 MED ORDER — TRAMADOL HCL 50 MG PO TABS
50.0000 mg | ORAL_TABLET | Freq: Four times a day (QID) | ORAL | Status: DC | PRN
  Administered 2023-05-30 – 2023-05-31 (×4): 50 mg via ORAL
  Filled 2023-05-30 (×4): qty 1

## 2023-05-30 MED ORDER — KETOROLAC TROMETHAMINE 30 MG/ML IJ SOLN
30.0000 mg | Freq: Four times a day (QID) | INTRAMUSCULAR | Status: DC
  Administered 2023-05-30 – 2023-06-01 (×7): 30 mg via INTRAVENOUS
  Filled 2023-05-30 (×7): qty 1

## 2023-05-30 NOTE — Progress Notes (Signed)
 PROGRESS NOTE    Julia [name]  UJW:[id number] DOB: 1975/10/11 DOA: 05/29/2023 PCP: Jeannine Milroy., MD   Brief Narrative:  48 year old female with history of ADHD, anxiety, chronic idiopathic constipation, depression, epilepsy, GERD, OCD presented with worsening facial swelling after she squeezed her pimple on her face and subsequently punctured it with a sewing needle.  On presentation, WBC was 14.8.  CT of soft tissue neck showed right facial cellulitis with no abscess or gas.  She was started on broad-spectrum antibiotics.  Assessment & Plan:   Right facial cellulitis - Imaging did not show abscess or gas.  Continue broad-spectrum antibiotics.  Anxiety/OCD/ADHD Continue bupropion, fluoxetine along with Adderall as needed  Chronic constipation--continue MiraLAX as needed  Leukocytosis - Improving  Acute metabolic acidosis - Mild.  Encourage oral intake.  History of complex partial seizures -Continue Topamax.  Outpatient follow-up with neurology  DVT prophylaxis: Lovenox Code Status: Full Family Communication: None at bedside Disposition Plan: Status is: Inpatient Remains inpatient appropriate because: Of severity of illness.  Need for IV antibiotics.  Antimicrobials: Rocephin  and vancomycin from 05/29/2023 onwards   Subjective: Patient seen and examined at bedside.  Still complains of right facial pain but feels better.  Denies vomiting, abdominal pain or fever.  Objective: Vitals:   05/29/23 0956 05/29/23 1855 05/29/23 2003 05/30/23 0414  BP: 116/69 94/67 104/67 (!) 124/90  Pulse: 64 62 73 67  Resp: 18 18 16 18   Temp: 98.1 F (36.7 C) 98.1 F (36.7 C) 98.5 F (36.9 C) 97.8 F (36.6 C)  TempSrc: Oral     SpO2: 100% 100% 100% 100%  Weight:      Height:        Intake/Output Summary (Last 24 hours) at 05/30/2023 0824 Last data filed at 05/29/2023 1736 Gross per 24 hour  Intake 400 ml  Output --  Net 400 ml   Filed Weights   05/29/23 0441  Weight: 78.5  kg    Examination:  General exam: Appears calm and comfortable.  On room air. Respiratory system: Bilateral decreased breath sounds at bases Cardiovascular system: S1 & S2 heard, Rate controlled Gastrointestinal system: Abdomen is nondistended, soft and nontender. Normal bowel sounds heard. Extremities: No cyanosis, clubbing, edema  Central nervous system: Alert and oriented. No focal neurological deficits. Moving extremities Skin: Right facial swelling, erythema, tenderness with no discharge present: Much improved compared to admission photo Psychiatry: Judgement and insight appear normal. Mood & affect appropriate.     Data Reviewed: I have personally reviewed following labs and imaging studies  CBC: Recent Labs  Lab 05/29/23 0536 05/30/23 0501  WBC 14.8* 13.9*  NEUTROABS 11.4*  --   HGB 11.9* 11.8*  HCT 35.5* 37.1  MCV 94.4 99.5  PLT 215 219   Basic Metabolic Panel: Recent Labs  Lab 05/29/23 0536 05/30/23 0501  NA 138 138  K 3.5 3.7  CL 107 112*  CO2 20* 18*  GLUCOSE 81 84  BUN 10 12  CREATININE 0.84 0.83  CALCIUM 8.8* 8.2*   GFR: Estimated Creatinine Clearance: 101.9 mL/min (by C-G formula based on SCr of 0.83 mg/dL). Liver Function Tests: Recent Labs  Lab 05/29/23 0536 05/30/23 0501  AST 15 27  ALT 5 12  ALKPHOS 76 68  BILITOT 0.4 0.5  PROT 6.0* 5.9*  ALBUMIN 3.8 3.0*   No results for input(s): "LIPASE", "AMYLASE" in the last 168 hours. No results for input(s): "AMMONIA" in the last 168 hours. Coagulation Profile: No results for input(s): "INR", "  PROTIME" in the last 168 hours. Cardiac Enzymes: No results for input(s): "CKTOTAL", "CKMB", "CKMBINDEX", "TROPONINI" in the last 168 hours. BNP (last 3 results) No results for input(s): "PROBNP" in the last 8760 hours. HbA1C: No results for input(s): "HGBA1C" in the last 72 hours. CBG: No results for input(s): "GLUCAP" in the last 168 hours. Lipid Profile: No results for input(s): "CHOL", "HDL",  "LDLCALC", "TRIG", "CHOLHDL", "LDLDIRECT" in the last 72 hours. Thyroid  Function Tests: No results for input(s): "TSH", "T4TOTAL", "FREET4", "T3FREE", "THYROIDAB" in the last 72 hours. Anemia Panel: No results for input(s): "VITAMINB12", "FOLATE", "FERRITIN", "TIBC", "IRON", "RETICCTPCT" in the last 72 hours. Sepsis Labs: No results for input(s): "PROCALCITON", "LATICACIDVEN" in the last 168 hours.  Recent Results (from the past 240 hours)  Blood culture (routine x 2)     Status: None (Preliminary result)   Collection Time: 05/29/23  5:36 AM   Specimen: BLOOD LEFT FOREARM  Result Value Ref Range Status   Specimen Description   Final    BLOOD LEFT FOREARM Performed at [facility], [address], [place], [place] [address]    Special Requests   Final    BOTTLES DRAWN AEROBIC AND ANAEROBIC Blood Culture adequate volume Performed at [facility], [address], [place], [place] [address]    Culture   Final    NO GROWTH < 12 HOURS Performed at [facility], [address], [place], [place] [address]    Report Status PENDING  Incomplete  Blood culture (routine x 2)     Status: None (Preliminary result)   Collection Time: 05/29/23  5:42 AM   Specimen: BLOOD  Result Value Ref Range Status   Specimen Description   Final    BLOOD LEFT ANTECUBITAL Performed at [facility], [address] [place], [place] [address]    Special Requests   Final    BOTTLES DRAWN AEROBIC AND ANAEROBIC Blood Culture adequate volume Performed at [facility], [address] [place], [place] [address]    Culture   Final    NO GROWTH < 12 HOURS Performed at [facility], [address] [place], [place] [address]    Report Status PENDING  Incomplete         Radiology Studies: CT Soft Tissue Neck W Contrast Result Date: 05/29/2023 CLINICAL DATA:  48 year old female with right side swelling onset yesterday  originating with a skin lesion. EXAM: CT NECK WITH CONTRAST TECHNIQUE: Multidetector CT imaging of the neck was performed using the standard protocol following the bolus administration of intravenous contrast. RADIATION DOSE REDUCTION: This exam was performed according to the departmental dose-optimization program which includes automated exposure control, adjustment of the mA and/or kV according to patient size and/or use of iterative reconstruction technique. CONTRAST:  75mL OMNIPAQUE  IOHEXOL  300 MG/ML  SOLN COMPARISON:  None Available. FINDINGS: Pharynx and larynx: Glottis is closed. Larynx, pharynx, parapharyngeal spaces, retropharyngeal spaces are negative. Salivary glands: Negative sublingual space. Left submandibular space, bilateral parotid spaces are normal. There is mild soft tissue edema and inflammation in the lateral aspect of the submandibular space, underlying abnormal thickening of the platysma there, overlying subcutaneous inflammation (see below). But the right submandibular gland appears to remain normal. Thyroid : Diminutive, negative. Lymph nodes: Broad-based area of right face, inferior cheek, Peri mandibular region subcutaneous soft tissue swelling, stranding, thickening of the platysma, and this underlies a skin marker placed on series 2, image 71 corresponding to the reported dermal lesion. There is regional  dermis thickening. No soft tissue gas. No organized or drainable fluid collection. Reactive appearing right level 1 B lymph nodes which are subcentimeter but increased in size and number. No lymph node heterogeneity. Other bilateral survey node stations remain normal. Vascular: Major vascular structures in the bilateral neck and at the skull base are patent, enhancing including the right IJ, right external jugular vein and major right facial veins. Limited intracranial: Negative. Visualized orbits: Negative. Mastoids and visualized paranasal sinuses: Retention cyst and mild mucosal  thickening in the right maxillary alveolar recess which is inseparable from the posterior right maxillary molar with fillings. Minimal paranasal sinus mucosal thickening otherwise. No layering sinus fluid. Tympanic cavities and mastoids are clear. 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 Ordinary cervical spine disc and endplate degeneration at C5-C6, although advanced for age. No acute osseous abnormality identified. Upper chest: Negative. IMPRESSION: 1. Broad-based Right Facial Cellulitis. Regional soft tissue edema and inflammation with no abscess or gas. Small reactive right level 1B lymph nodes. 2. 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 3. Age advanced cervical spine disc and endplate degeneration at C5-C6. Electronically Signed   By: Marlise Simpers M.D.   On: 05/29/2023 06:55        Scheduled Meds:  buPROPion  300 mg Oral Daily   enoxaparin (LOVENOX) injection  40 mg Subcutaneous Q24H   FLUoxetine  60 mg Oral QHS   topiramate  300 mg Oral QHS   Continuous Infusions:  cefTRIAXone  (ROCEPHIN )  IV 1 g (05/29/23 1649)   vancomycin 1,000 mg (05/30/23 0222)          Audria Leather, MD [facility] 05/30/2023, 8:24 AM

## 2023-05-30 NOTE — Plan of Care (Signed)

## 2023-05-31 DIAGNOSIS — L03211 Cellulitis of face: Secondary | ICD-10-CM | POA: Diagnosis not present

## 2023-05-31 LAB — CBC WITH DIFFERENTIAL/PLATELET
Abs Immature Granulocytes: 0.06 10*3/uL (ref 0.00–0.07)
Basophils Absolute: 0 10*3/uL (ref 0.0–0.1)
Basophils Relative: 0 %
Eosinophils Absolute: 0.2 10*3/uL (ref 0.0–0.5)
Eosinophils Relative: 1 %
HCT: 39.1 % (ref 36.0–46.0)
Hemoglobin: 12.6 g/dL (ref 12.0–15.0)
Immature Granulocytes: 1 %
Lymphocytes Relative: 19 %
Lymphs Abs: 2.4 10*3/uL (ref 0.7–4.0)
MCH: 31.3 pg (ref 26.0–34.0)
MCHC: 32.2 g/dL (ref 30.0–36.0)
MCV: 97.3 fL (ref 80.0–100.0)
Monocytes Absolute: 0.9 10*3/uL (ref 0.1–1.0)
Monocytes Relative: 7 %
Neutro Abs: 9.2 10*3/uL — ABNORMAL HIGH (ref 1.7–7.7)
Neutrophils Relative %: 72 %
Platelets: 243 10*3/uL (ref 150–400)
RBC: 4.02 MIL/uL (ref 3.87–5.11)
RDW: 12.6 % (ref 11.5–15.5)
WBC: 12.8 10*3/uL — ABNORMAL HIGH (ref 4.0–10.5)
nRBC: 0 % (ref 0.0–0.2)

## 2023-05-31 LAB — BASIC METABOLIC PANEL WITH GFR
Anion gap: 8 (ref 5–15)
BUN: 10 mg/dL (ref 6–20)
CO2: 19 mmol/L — ABNORMAL LOW (ref 22–32)
Calcium: 8.6 mg/dL — ABNORMAL LOW (ref 8.9–10.3)
Chloride: 111 mmol/L (ref 98–111)
Creatinine, Ser: 0.79 mg/dL (ref 0.44–1.00)
GFR, Estimated: >60 mL/min (ref >60)
Glucose, Bld: 80 mg/dL (ref 70–99)
Potassium: 3.2 mmol/L — ABNORMAL LOW (ref 3.5–5.1)
Sodium: 138 mmol/L (ref 135–145)

## 2023-05-31 LAB — C-REACTIVE PROTEIN: CRP: 2.2 mg/dL — ABNORMAL HIGH (ref <1.0)

## 2023-05-31 LAB — MAGNESIUM: Magnesium: 2.1 mg/dL (ref 1.7–2.4)

## 2023-05-31 MED ORDER — POTASSIUM CHLORIDE CRYS ER 20 MEQ PO TBCR
40.0000 meq | EXTENDED_RELEASE_TABLET | ORAL | Status: AC
Start: 2023-05-31 — End: 2023-05-31
  Administered 2023-05-31 (×2): 40 meq via ORAL
  Filled 2023-05-31 (×2): qty 2

## 2023-05-31 MED ORDER — BUPROPION HCL ER (XL) 300 MG PO TB24
450.0000 mg | ORAL_TABLET | Freq: Every day | ORAL | Status: DC
  Administered 2023-05-31 – 2023-06-01 (×2): 450 mg via ORAL
  Filled 2023-05-31 (×2): qty 1

## 2023-05-31 MED ORDER — VANCOMYCIN HCL 1.25 G IV SOLR
1250.0000 mg | Freq: Two times a day (BID) | INTRAVENOUS | Status: DC
  Administered 2023-05-31 – 2023-06-01 (×2): 1250 mg via INTRAVENOUS
  Filled 2023-05-31 (×3): qty 25

## 2023-05-31 NOTE — Progress Notes (Signed)
 PHARMACY NOTE:  ANTIMICROBIAL RENAL DOSAGE ADJUSTMENT  Current antimicrobial regimen includes a mismatch between antimicrobial dosage and estimated renal function.  As per policy approved by the Pharmacy & Therapeutics and Medical Executive Committees, the antimicrobial dosage will be adjusted accordingly.  Current antimicrobial dosage: Vancomycin 1000mg  IV q12h  Indication: facial cellulitis  Renal Function:  Estimated Creatinine Clearance: 105.7 mL/min (by C-G formula based on SCr of 0.79 mg/dL). []      On intermittent HD, scheduled: []      On CRRT    Antimicrobial dosage has been changed to: Vancomycin 1250mg  IV q12h (eAUC 480 using Scr rounded to 0.8, Vd 0.72)  Additional comments: N/A   Thank you for allowing pharmacy to be a part of this patient's care.  Roselee Cong, PharmD Clinical Pharmacist  5/5/202510:12 AM

## 2023-05-31 NOTE — Plan of Care (Signed)

## 2023-05-31 NOTE — Progress Notes (Signed)
 PROGRESS NOTE    Julia [name]  ZOX:[id number] DOB: 1975-09-14 DOA: 05/29/2023 PCP: Jeannine Milroy., MD   Brief Narrative:  48 year old female with history of ADHD, anxiety, chronic idiopathic constipation, depression, epilepsy, GERD, OCD presented with worsening facial swelling after she squeezed her pimple on her face and subsequently punctured it with a sewing needle.  On presentation, WBC was 14.8.  CT of soft tissue neck showed right facial cellulitis with no abscess or gas.  She was started on broad-spectrum antibiotics.  Assessment & Plan:   Right facial cellulitis - Imaging did not show abscess or gas.  Continue broad-spectrum antibiotics.  Cellulitis is improving, now draining slightly.  Patient does not feel well today with increasing pain.  Does not feel ready to go home today.  Anxiety/OCD/ADHD -Continue bupropion, fluoxetine along with Adderall as needed  Chronic constipation--continue MiraLAX as needed  Leukocytosis - Improving; monitor  Hypokalemia - Replace.  Repeat a.m. labs.  Acute metabolic acidosis - Mild.  Encourage oral intake.  History of complex partial seizures -Continue Topamax.  Outpatient follow-up with neurology  DVT prophylaxis: Lovenox Code Status: Full Family Communication: None at bedside Disposition Plan: Status is: Inpatient Remains inpatient appropriate because: Of severity of illness.  Need for IV antibiotics.  Antimicrobials: Rocephin  and vancomycin from 05/29/2023 onwards   Subjective: Patient seen and examined at bedside.  Does not feel well this morning, complains of increasing pain and has some drainage from the facial wound.  No fever or vomiting reported. Objective: Vitals:   05/29/23 2003 05/30/23 0414 05/30/23 2109 05/31/23 0550  BP: 104/67 (!) 124/90 126/66 95/62  Pulse: 73 67 (!) 59 68  Resp: 16 18 17 19   Temp: 98.5 F (36.9 C) 97.8 F (36.6 C) 98.7 F (37.1 C) 97.9 F (36.6 C)  TempSrc:    Oral  SpO2: 100% 100%  100% 99%  Weight:      Height:        Intake/Output Summary (Last 24 hours) at 05/31/2023 1003 Last data filed at 05/30/2023 2351 Gross per 24 hour  Intake 923.33 ml  Output --  Net 923.33 ml   Filed Weights   05/29/23 0441  Weight: 78.5 kg    Examination:  General: On room air.  No distress ENT/neck: No thyromegaly.  JVD is not elevated  respiratory: Decreased breath sounds at bases bilaterally with some crackles; no wheezing  CVS: S1-S2 heard, rate controlled currently Abdominal: Soft, nontender, slightly distended; no organomegaly, bowel sounds are heard Extremities: Trace lower extremity edema; no cyanosis  CNS: Awake and alert.  No focal neurologic deficit.  Moves extremities Lymph: No obvious lymphadenopathy Skin: Right facial swelling, erythema, tenderness with a blackish eschar with no current drainage.  Erythema has improved compared to admission photo. psych: Slightly anxious.  Not agitated. musculoskeletal: No obvious joint swelling/deformity     Data Reviewed: I have personally reviewed following labs and imaging studies  CBC: Recent Labs  Lab 05/29/23 0536 05/30/23 0501 05/31/23 0418  WBC 14.8* 13.9* 12.8*  NEUTROABS 11.4*  --  9.2*  HGB 11.9* 11.8* 12.6  HCT 35.5* 37.1 39.1  MCV 94.4 99.5 97.3  PLT 215 219 243   Basic Metabolic Panel: Recent Labs  Lab 05/29/23 0536 05/30/23 0501 05/31/23 0418  NA 138 138 138  K 3.5 3.7 3.2*  CL 107 112* 111  CO2 20* 18* 19*  GLUCOSE 81 84 80  BUN 10 12 10   CREATININE 0.84 0.83 0.79  CALCIUM 8.8* 8.2* 8.6*  MG  --   --  2.1   GFR: Estimated Creatinine Clearance: 105.7 mL/min (by C-G formula based on SCr of 0.79 mg/dL). Liver Function Tests: Recent Labs  Lab 05/29/23 0536 05/30/23 0501  AST 15 27  ALT 5 12  ALKPHOS 76 68  BILITOT 0.4 0.5  PROT 6.0* 5.9*  ALBUMIN 3.8 3.0*   No results for input(s): "LIPASE", "AMYLASE" in the last 168 hours. No results for input(s): "AMMONIA" in the last 168  hours. Coagulation Profile: No results for input(s): "INR", "PROTIME" in the last 168 hours. Cardiac Enzymes: No results for input(s): "CKTOTAL", "CKMB", "CKMBINDEX", "TROPONINI" in the last 168 hours. BNP (last 3 results) No results for input(s): "PROBNP" in the last 8760 hours. HbA1C: No results for input(s): "HGBA1C" in the last 72 hours. CBG: No results for input(s): "GLUCAP" in the last 168 hours. Lipid Profile: No results for input(s): "CHOL", "HDL", "LDLCALC", "TRIG", "CHOLHDL", "LDLDIRECT" in the last 72 hours. Thyroid  Function Tests: No results for input(s): "TSH", "T4TOTAL", "FREET4", "T3FREE", "THYROIDAB" in the last 72 hours. Anemia Panel: No results for input(s): "VITAMINB12", "FOLATE", "FERRITIN", "TIBC", "IRON", "RETICCTPCT" in the last 72 hours. Sepsis Labs: No results for input(s): "PROCALCITON", "LATICACIDVEN" in the last 168 hours.  Recent Results (from the past 240 hours)  Blood culture (routine x 2)     Status: None (Preliminary result)   Collection Time: 05/29/23  5:36 AM   Specimen: BLOOD LEFT FOREARM  Result Value Ref Range Status   Specimen Description   Final    BLOOD LEFT FOREARM Performed at [facility], [address] [place], [place] [address]    Special Requests   Final    BOTTLES DRAWN AEROBIC AND ANAEROBIC Blood Culture adequate volume Performed at [facility], [address] [place], [place] [address]    Culture   Final    NO GROWTH 2 DAYS Performed at [facility], [address], [place], [place] [address]    Report Status PENDING  Incomplete  Blood culture (routine x 2)     Status: None (Preliminary result)   Collection Time: 05/29/23  5:42 AM   Specimen: BLOOD  Result Value Ref Range Status   Specimen Description   Final    BLOOD LEFT ANTECUBITAL Performed at [facility], [address] [place], [place] [address]    Special Requests   Final    BOTTLES DRAWN  AEROBIC AND ANAEROBIC Blood Culture adequate volume Performed at [facility], [address] [place], [place] [address]    Culture   Final    NO GROWTH 2 DAYS Performed at [facility], [address], [place], [place] [address]    Report Status PENDING  Incomplete         Radiology Studies: No results found.       Scheduled Meds:  buPROPion  450 mg Oral Daily   enoxaparin (LOVENOX) injection  40 mg Subcutaneous Q24H   FLUoxetine  60 mg Oral QHS   ketorolac   30 mg Intravenous Q6H   potassium chloride   40 mEq Oral Q4H   topiramate  300 mg Oral QHS   Continuous Infusions:  cefTRIAXone  (ROCEPHIN )  IV Stopped (05/30/23 1401)   vancomycin 1,000 mg (05/31/23 0121)          Audria Leather, MD [facility] 05/31/2023, 10:03 AM

## 2023-06-01 DIAGNOSIS — L03211 Cellulitis of face: Secondary | ICD-10-CM | POA: Diagnosis not present

## 2023-06-01 LAB — CBC WITH DIFFERENTIAL/PLATELET
Abs Immature Granulocytes: 0.05 10*3/uL (ref 0.00–0.07)
Basophils Absolute: 0 10*3/uL (ref 0.0–0.1)
Basophils Relative: 0 %
Eosinophils Absolute: 0.2 10*3/uL (ref 0.0–0.5)
Eosinophils Relative: 2 %
HCT: 39.8 % (ref 36.0–46.0)
Hemoglobin: 12.6 g/dL (ref 12.0–15.0)
Immature Granulocytes: 1 %
Lymphocytes Relative: 26 %
Lymphs Abs: 2.5 10*3/uL (ref 0.7–4.0)
MCH: 31.3 pg (ref 26.0–34.0)
MCHC: 31.7 g/dL (ref 30.0–36.0)
MCV: 98.8 fL (ref 80.0–100.0)
Monocytes Absolute: 0.9 10*3/uL (ref 0.1–1.0)
Monocytes Relative: 9 %
Neutro Abs: 6 10*3/uL (ref 1.7–7.7)
Neutrophils Relative %: 62 %
Platelets: 244 10*3/uL (ref 150–400)
RBC: 4.03 MIL/uL (ref 3.87–5.11)
RDW: 12.6 % (ref 11.5–15.5)
WBC: 9.6 10*3/uL (ref 4.0–10.5)
nRBC: 0 % (ref 0.0–0.2)

## 2023-06-01 LAB — BASIC METABOLIC PANEL WITH GFR
Anion gap: 5 (ref 5–15)
BUN: 13 mg/dL (ref 6–20)
CO2: 21 mmol/L — ABNORMAL LOW (ref 22–32)
Calcium: 8.4 mg/dL — ABNORMAL LOW (ref 8.9–10.3)
Chloride: 111 mmol/L (ref 98–111)
Creatinine, Ser: 0.73 mg/dL (ref 0.44–1.00)
GFR, Estimated: >60 mL/min (ref >60)
Glucose, Bld: 75 mg/dL (ref 70–99)
Potassium: 3.7 mmol/L (ref 3.5–5.1)
Sodium: 137 mmol/L (ref 135–145)

## 2023-06-01 LAB — MAGNESIUM: Magnesium: 2 mg/dL (ref 1.7–2.4)

## 2023-06-01 LAB — C-REACTIVE PROTEIN: CRP: 0.8 mg/dL (ref <1.0)

## 2023-06-01 MED ORDER — TRAMADOL HCL 50 MG PO TABS: 50.0000 mg | ORAL_TABLET | Freq: Four times a day (QID) | ORAL | 0 refills | Status: DC | PRN

## 2023-06-01 MED ORDER — MUPIROCIN 2 % EX OINT: 1.0000 | TOPICAL_OINTMENT | Freq: Two times a day (BID) | CUTANEOUS | 0 refills | Status: AC

## 2023-06-01 MED ORDER — AMPHETAMINE-DEXTROAMPHETAMINE 30 MG PO TABS: 30.0000 mg | ORAL_TABLET | Freq: Every day | ORAL | Status: AC | PRN

## 2023-06-01 MED ORDER — DOXYCYCLINE HYCLATE 100 MG PO TABS: 100.0000 mg | ORAL_TABLET | Freq: Two times a day (BID) | ORAL | 0 refills | Status: AC

## 2023-06-01 MED ORDER — AMOXICILLIN-POT CLAVULANATE 875-125 MG PO TABS: 1.0000 | ORAL_TABLET | Freq: Two times a day (BID) | ORAL | 0 refills | Status: AC

## 2023-06-01 NOTE — Discharge Summary (Signed)
 Physician Discharge Summary  Julia [name] KGM:[id number] DOB: 1975/11/09 DOA: 05/29/2023  PCP: Jeannine Milroy., MD  Admit date: 05/29/2023 Discharge date: 06/01/2023  Admitted From: Home Disposition: Home  Recommendations for Outpatient Follow-up:  Follow up with PCP in 1 week  Follow-up with ENT/Dr. Soldatova within a week Follow up in ED if symptoms worsen or new appear   Home Health: No Equipment/Devices: None  Discharge Condition: Stable CODE STATUS: Full Diet recommendation: Regular Brief/Interim Summary: 48 year old female with history of ADHD, anxiety, chronic idiopathic constipation, depression, epilepsy, GERD, OCD presented with worsening facial swelling after she squeezed her pimple on her face and subsequently punctured it with a sewing needle. On presentation, WBC was 14.8. CT of soft tissue neck showed right facial cellulitis with no abscess or gas. She was started on broad-spectrum antibiotics.  During the hospitalization, her condition has improved.  Cellulitis has significantly improved, wound is draining on its own.  She is afebrile with no leukocytosis currently and feels okay to go home today.  I have discussed with ENT on-call/Dr. Soldatova who agrees that patient can be discharged on appropriate antibiotics to also cover for MRSA and her office will arrange an outpatient office visit soon.  Patient will be discharged on oral Augmentin  and doxycycline  for 10 more days today.  Discharge Diagnoses:   Right facial cellulitis and abscess - Imaging did not show abscess or gas.  Treated with broad-spectrum antibiotics including Rocephin  and vancomycin.   Cellulitis has significantly improved, wound is draining on its own.  She is afebrile with no leukocytosis currently and feels okay to go home today.  I have discussed with ENT on-call/Dr. Soldatova who agrees that patient can be discharged on appropriate antibiotics to also cover for MRSA and her office will arrange an  outpatient office visit soon.  Patient will be discharged on oral Augmentin  and doxycycline  for 10 more days today.   Anxiety/OCD/ADHD -Continue bupropion, fluoxetine along with Adderall as needed.  Outpatient follow-up with psychiatry   Chronic constipation--continue MiraLAX as needed   Leukocytosis - Resolved   Hypokalemia - Improved   Acute metabolic acidosis - Improving.  Encourage oral intake.   History of complex partial seizures -Continue Topamax.  Outpatient follow-up with neurology  Discharge Instructions  Discharge Instructions     Diet general   Complete by: As directed    Increase activity slowly   Complete by: As directed       Allergies as of 06/01/2023       Reactions   Ciprofloxacin Hives   Percocet [oxycodone-acetaminophen ] Nausea Only        Medication List     TAKE these medications    ALPRAZolam 1 MG tablet Commonly known as: XANAX Take 1 mg by mouth daily as needed for anxiety. For anxiety   amoxicillin -clavulanate 875-125 MG tablet Commonly known as: AUGMENTIN  Take 1 tablet by mouth 2 (two) times daily for 10 days.   amphetamine -dextroamphetamine  30 MG tablet Commonly known as: Adderall Take 1 tablet by mouth daily as needed (as needed for adhd). What changed:  when to take this reasons to take this Another medication with the same name was removed. Continue taking this medication, and follow the directions you see here.   buPROPion 150 MG 24 hr tablet Commonly known as: WELLBUTRIN XL Take 450 mg by mouth daily.   doxycycline  100 MG tablet Commonly known as: VIBRA -TABS Take 1 tablet (100 mg total) by mouth 2 (two) times daily for 10 days.  FLUoxetine 40 MG capsule Commonly known as: PROZAC Take 40 mg by mouth daily.   FLUoxetine 20 MG capsule Commonly known as: PROZAC Take 20 mg by mouth daily.   mupirocin ointment 2 % Commonly known as: BACTROBAN Apply 1 Application topically 2 (two) times daily. To affected facial  area   topiramate 100 MG tablet Commonly known as: TOPAMAX Take 300 mg by mouth at bedtime.   traMADol 50 MG tablet Commonly known as: ULTRAM Take 1 tablet (50 mg total) by mouth every 6 (six) hours as needed for moderate pain (pain score 4-6).        Follow-up Information     Soldatova, [place], MD. Schedule an appointment as soon as possible for a visit.   Specialty: Otolaryngology Contact information: [address] [place] [address] [phone]         Jeannine Milroy., MD. Schedule an appointment as soon as possible for a visit in 1 week(s).   Specialty: Internal Medicine Contact information: [address] [place] [address] [phone]                Allergies  Allergen Reactions   Ciprofloxacin Hives   Percocet [Oxycodone-Acetaminophen ] Nausea Only    Consultations: Spoke to ENT on phone   Procedures/Studies: CT Soft Tissue Neck W Contrast Result Date: 05/29/2023 CLINICAL DATA:  48 year old female with right side swelling onset yesterday originating with a skin lesion. EXAM: CT NECK WITH CONTRAST TECHNIQUE: Multidetector CT imaging of the neck was performed using the standard protocol following the bolus administration of intravenous contrast. RADIATION DOSE REDUCTION: This exam was performed according to the departmental dose-optimization program which includes automated exposure control, adjustment of the mA and/or kV according to patient size and/or use of iterative reconstruction technique. CONTRAST:  75mL OMNIPAQUE  IOHEXOL  300 MG/ML  SOLN COMPARISON:  None Available. FINDINGS: Pharynx and larynx: Glottis is closed. Larynx, pharynx, parapharyngeal spaces, retropharyngeal spaces are negative. Salivary glands: Negative sublingual space. Left submandibular space, bilateral parotid spaces are normal. There is mild soft tissue edema and inflammation in the lateral aspect of the submandibular space, underlying abnormal thickening of the  platysma there, overlying subcutaneous inflammation (see below). But the right submandibular gland appears to remain normal. Thyroid : Diminutive, negative. Lymph nodes: Broad-based area of right face, inferior cheek, Peri mandibular region subcutaneous soft tissue swelling, stranding, thickening of the platysma, and this underlies a skin marker placed on series 2, image 71 corresponding to the reported dermal lesion. There is regional dermis thickening. No soft tissue gas. No organized or drainable fluid collection. Reactive appearing right level 1 B lymph nodes which are subcentimeter but increased in size and number. No lymph node heterogeneity. Other bilateral survey node stations remain normal. Vascular: Major vascular structures in the bilateral neck and at the skull base are patent, enhancing including the right IJ, right external jugular vein and major right facial veins. Limited intracranial: Negative. Visualized orbits: Negative. Mastoids and visualized paranasal sinuses: Retention cyst and mild mucosal thickening in the right maxillary alveolar recess which is inseparable from the posterior right maxillary molar with fillings. Minimal paranasal sinus mucosal thickening otherwise. No layering sinus fluid. Tympanic cavities and mastoids are clear. 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 Ordinary cervical spine disc and endplate degeneration at C5-C6, although advanced for age. No acute osseous abnormality identified. Upper chest: Negative. IMPRESSION: 1. Broad-based Right Facial Cellulitis. Regional soft tissue edema and inflammation with no abscess or gas. Small reactive right level  1B lymph nodes. 2. 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 3. Age advanced cervical spine disc and endplate degeneration at C5-C6. Electronically Signed   By: Marlise Simpers M.D.   On: 05/29/2023 06:55      Subjective: Patient seen and  examined at bedside.  Feels okay to go home today.  No fever, chest pain, vomiting reported.  Facial wound as still draining.  Discharge Exam: Vitals:   05/31/23 2008 06/01/23 0430  BP: (!) 128/94 111/67  Pulse: 86 66  Resp: 19 16  Temp: 98.4 F (36.9 C) 97.9 F (36.6 C)  SpO2: 100% 98%    General: Pt is alert, awake, not in acute distress Cardiovascular: rate controlled, S1/S2 + Respiratory: bilateral decreased breath sounds at bases Abdominal: Soft, NT, ND, bowel sounds + Extremities: no edema, no cyanosis Skin: Right facial swelling, erythema and tenderness have much improved with some induration and mild drainage    The results of significant diagnostics from this hospitalization (including imaging, microbiology, ancillary and laboratory) are listed below for reference.     Microbiology: Recent Results (from the past 240 hours)  Blood culture (routine x 2)     Status: None (Preliminary result)   Collection Time: 05/29/23  5:36 AM   Specimen: BLOOD LEFT FOREARM  Result Value Ref Range Status   Specimen Description   Final    BLOOD LEFT FOREARM Performed at [facility], [address], [place], [place] [address]    Special Requests   Final    BOTTLES DRAWN AEROBIC AND ANAEROBIC Blood Culture adequate volume Performed at [facility], [address], [place], [place] [address]    Culture   Final    NO GROWTH 3 DAYS Performed at [facility], [address], [place], [place] [address]    Report Status PENDING  Incomplete  Blood culture (routine x 2)     Status: None (Preliminary result)   Collection Time: 05/29/23  5:42 AM   Specimen: BLOOD  Result Value Ref Range Status   Specimen Description   Final    BLOOD LEFT ANTECUBITAL Performed at [facility], [address], [place], [place] [address]    Special Requests   Final    BOTTLES DRAWN AEROBIC AND ANAEROBIC Blood Culture adequate  volume Performed at [facility], [address], [place], [place] [address]    Culture   Final    NO GROWTH 3 DAYS Performed at [facility], [address], [place], [place] [address]    Report Status PENDING  Incomplete     Labs: BNP (last 3 results) No results for input(s): "BNP" in the last 8760 hours. Basic Metabolic Panel: Recent Labs  Lab 05/29/23 0536 05/30/23 0501 05/31/23 0418 06/01/23 0503  NA 138 138 138 137  K 3.5 3.7 3.2* 3.7  CL 107 112* 111 111  CO2 20* 18* 19* 21*  GLUCOSE 81 84 80 75  BUN 10 12 10 13   CREATININE 0.84 0.83 0.79 0.73  CALCIUM 8.8* 8.2* 8.6* 8.4*  MG  --   --  2.1 2.0   Liver Function Tests: Recent Labs  Lab 05/29/23 0536 05/30/23 0501  AST 15 27  ALT 5 12  ALKPHOS 76 68  BILITOT 0.4 0.5  PROT 6.0* 5.9*  ALBUMIN 3.8 3.0*   No results for input(s): "LIPASE", "AMYLASE" in the last 168 hours. No results for input(s): "AMMONIA" in the last 168  hours. CBC: Recent Labs  Lab 05/29/23 0536 05/30/23 0501 05/31/23 0418 06/01/23 0503  WBC 14.8* 13.9* 12.8* 9.6  NEUTROABS 11.4*  --  9.2* 6.0  HGB 11.9* 11.8* 12.6 12.6  HCT 35.5* 37.1 39.1 39.8  MCV 94.4 99.5 97.3 98.8  PLT 215 219 243 244   Cardiac Enzymes: No results for input(s): "CKTOTAL", "CKMB", "CKMBINDEX", "TROPONINI" in the last 168 hours. BNP: Invalid input(s): "POCBNP" CBG: No results for input(s): "GLUCAP" in the last 168 hours. D-Dimer No results for input(s): "DDIMER" in the last 72 hours. Hgb A1c No results for input(s): "HGBA1C" in the last 72 hours. Lipid Profile No results for input(s): "CHOL", "HDL", "LDLCALC", "TRIG", "CHOLHDL", "LDLDIRECT" in the last 72 hours. Thyroid  function studies No results for input(s): "TSH", "T4TOTAL", "T3FREE", "THYROIDAB" in the last 72 hours.  Invalid input(s): "FREET3" Anemia work up No results for input(s): "VITAMINB12", "FOLATE", "FERRITIN", "TIBC", "IRON", "RETICCTPCT" in the last 72  hours. Urinalysis    Component Value Date/Time   COLORURINE YELLOW 07/08/2014 1222   APPEARANCEUR CLOUDY (A) 07/08/2014 1222   LABSPEC 1.030 07/08/2014 1222   PHURINE 6.0 07/08/2014 1222   GLUCOSEU NEGATIVE 07/08/2014 1222   HGBUR NEGATIVE 07/08/2014 1222   BILIRUBINUR SMALL (A) 07/08/2014 1222   KETONESUR NEGATIVE 07/08/2014 1222   PROTEINUR NEGATIVE 07/08/2014 1222   UROBILINOGEN 0.2 07/08/2014 1222   NITRITE NEGATIVE 07/08/2014 1222   LEUKOCYTESUR NEGATIVE 07/08/2014 1222   Sepsis Labs Recent Labs  Lab 05/29/23 0536 05/30/23 0501 05/31/23 0418 06/01/23 0503  WBC 14.8* 13.9* 12.8* 9.6   Microbiology Recent Results (from the past 240 hours)  Blood culture (routine x 2)     Status: None (Preliminary result)   Collection Time: 05/29/23  5:36 AM   Specimen: BLOOD LEFT FOREARM  Result Value Ref Range Status   Specimen Description   Final    BLOOD LEFT FOREARM Performed at [facility], [address] [place], [place] [address]    Special Requests   Final    BOTTLES DRAWN AEROBIC AND ANAEROBIC Blood Culture adequate volume Performed at [facility], [address], [place], [place] [address]    Culture   Final    NO GROWTH 3 DAYS Performed at [facility], [address] [place], [place] [address]    Report Status PENDING  Incomplete  Blood culture (routine x 2)     Status: None (Preliminary result)   Collection Time: 05/29/23  5:42 AM   Specimen: BLOOD  Result Value Ref Range Status   Specimen Description   Final    BLOOD LEFT ANTECUBITAL Performed at [facility], [address], [place], [place] [address]    Special Requests   Final    BOTTLES DRAWN AEROBIC AND ANAEROBIC Blood Culture adequate volume Performed at [facility], [address] [place], [place] [address]    Culture   Final    NO GROWTH 3 DAYS Performed at [facility], [address] [place],  [place] [address]    Report Status PENDING  Incomplete     Time coordinating discharge: 35 minutes  SIGNED:   Audria Leather, MD  [facility] 06/01/2023, 10:47 AM

## 2023-06-01 NOTE — Plan of Care (Signed)

## 2023-06-01 NOTE — Plan of Care (Signed)

## 2023-06-03 LAB — CULTURE, BLOOD (ROUTINE X 2)
Culture: NO GROWTH
Culture: NO GROWTH
Special Requests: ADEQUATE
Special Requests: ADEQUATE

## 2023-06-04 ENCOUNTER — Institutional Professional Consult (permissible substitution) (INDEPENDENT_AMBULATORY_CARE_PROVIDER_SITE_OTHER): Admitting: Otolaryngology

## 2023-06-07 ENCOUNTER — Encounter (INDEPENDENT_AMBULATORY_CARE_PROVIDER_SITE_OTHER): Payer: Self-pay | Admitting: Otolaryngology

## 2023-06-07 ENCOUNTER — Ambulatory Visit (INDEPENDENT_AMBULATORY_CARE_PROVIDER_SITE_OTHER): Admitting: Otolaryngology

## 2023-06-07 VITALS — BP 123/82 | HR 74 | Ht 73.0 in | Wt 172.0 lb

## 2023-06-07 DIAGNOSIS — L03211 Cellulitis of face: Secondary | ICD-10-CM | POA: Diagnosis not present

## 2023-06-07 NOTE — Progress Notes (Signed)
 ENT CONSULT:  Reason for Consult: right facial swelling  furuncle right face cellulitis   HPI: Discussed the use of AI scribe software for clinical note transcription with the patient, who gave verbal consent to proceed.  History of Present Illness Julia [name] is a 48 year old female who presents with facial swelling and soft infection that required admission to the hospital for IV abx and observation. She was referred by the ER doctor for evaluation of facial swelling following her admission  She experienced a rapid onset of facial swelling along right mandible after developing a furuncle she unroofed with a needle around ~ 06/01/23, which significantly worsened by next day. Initially, she attempted to manage the swelling by popping it with a needle. The swelling extended to her throat, causing concern for airway compromise, and her ears felt hot and on fire, prompting her visit to the emergency room.  In the emergency room, she was treated with IV antibiotics, and tramadol  for pain, which she is no longer using. She was hospitalized for four days, receiving anti-inflammatories and antibiotics every six hours. She continues to take antibiotics for a total of ten days (Doxy).  She reports significant improvement in her symptoms since the initial presentation, although there is still a small lump present. She has no history of skin infections or immunosuppressive conditions such as diabetes.  Records Reviewed:  D/c summary 06/01/23 Brief/Interim Summary: 48 year old female with history of ADHD, anxiety, chronic idiopathic constipation, depression, epilepsy, GERD, OCD presented with worsening facial swelling after she squeezed her pimple on her face and subsequently punctured it with a sewing needle. On presentation, WBC was 14.8. CT of soft tissue neck showed right facial cellulitis with no abscess or gas. She was started on broad-spectrum antibiotics.  During the hospitalization, her condition has  improved.  Cellulitis has significantly improved, wound is draining on its own.  She is afebrile with no leukocytosis currently and feels okay to go home today.  I have discussed with ENT on-call/Dr. Anasia Agro who agrees that patient can be discharged on appropriate antibiotics to also cover for MRSA and her office will arrange an outpatient office visit soon.  Patient will be discharged on oral Augmentin  and doxycycline  for 10 more days today.  Right facial cellulitis and abscess - Imaging did not show abscess or gas.  Treated with broad-spectrum antibiotics including Rocephin  and vancomycin .   Cellulitis has significantly improved, wound is draining on its own.  She is afebrile with no leukocytosis currently and feels okay to go home today.  I have discussed with ENT on-call/Dr. Enijah Furr who agrees that patient can be discharged on appropriate antibiotics to also cover for MRSA and her office will arrange an outpatient office visit soon.  Patient will be discharged on oral Augmentin  and doxycycline  for 10 more days today.  D/c'ed home on Doxy   Past Medical History:  Diagnosis Date   Acute posterior anal fissure    ADHD (attention deficit hyperactivity disorder)    Anemia    Anorexia    history of eating disorder   Anxiety    Chronic idiopathic constipation    Depression    Epilepsy (HCC)    Family history of bladder cancer    Family history of breast cancer    Family history of ovarian cancer    GERD (gastroesophageal reflux disease)    Obsessive compulsive disorder    OCD (obsessive compulsive disorder)    Seizures (HCC) 05/17/14    Past Surgical History:  Procedure Laterality Date   BREAST LUMPECTOMY WITH RADIOACTIVE SEED LOCALIZATION Right 10/05/2019   Procedure: RIGHT BREAST LUMPECTOMY WITH RADIOACTIVE SEED LOCALIZATION;  Surgeon: Sim Dryer, MD;  Location:  SUR[facility]ERY [facility];  Service: General;  Laterality: Right;   LIPOSUCTION TRUNK     TONSILLECTOMY      Family  History  Problem Relation Age of Onset   Parkinsonism Mother 43   Hypertension Father    Stroke Father    Lymphoma Father 61   Colon polyps Father    Colon polyps Brother        at least 3   Rheum arthritis Maternal Grandfather    Parkinsonism Maternal Grandfather    Diabetes Maternal Grandmother    Parkinsonism Paternal Grandmother    Autism spectrum disorder Daughter        18q12.2 del   Skin cancer Maternal Aunt        BCC and SCC   Breast cancer Paternal Aunt 96   Ovarian cancer Paternal Aunt        dx in her early 60s   Bladder Cancer Paternal Aunt        dx in her 82s   Drug abuse Paternal Uncle    Drug abuse Paternal Aunt    Colon cancer Neg Hx    Esophageal cancer Neg Hx    Stomach cancer Neg Hx    Liver disease Neg Hx    Pancreatic cancer Neg Hx     Social History:  reports that she has never smoked. She has never used smokeless tobacco. She reports current alcohol use of about 1.0 standard drink of alcohol per week. She reports that she does not use drugs.  Allergies:  Allergies  Allergen Reactions   Ciprofloxacin Hives   Percocet [Oxycodone-Acetaminophen ] Nausea Only    Medications: I have reviewed the patient's current medications.  The PMH, PSH, Medications, Allergies, and SH were reviewed and updated.  ROS: Constitutional: Negative for fever, weight loss and weight gain. Cardiovascular: Negative for chest pain and dyspnea on exertion. Respiratory: Is not experiencing shortness of breath at rest. Gastrointestinal: Negative for nausea and vomiting. Neurological: Negative for headaches. Psychiatric: The patient is not nervous/anxious  Blood pressure 123/82, pulse 74, height 6\' 1"  (1.854 m), weight 172 lb (78 kg), SpO2 98%. Body mass index is 22.69 kg/m.  PHYSICAL EXAM:  Exam: General: Well-developed, well-nourished Respiratory Respiratory effort: Equal inspiration and expiration without stridor Cardiovascular Peripheral Vascular: Warm  extremities with equal color/perfusion Eyes: No nystagmus with equal extraocular motion bilaterally Neuro/Psych/Balance: Patient oriented to person, place, and time; Appropriate mood and affect; Gait is intact with no imbalance; Cranial nerves I-XII are intact Head and Face Inspection: Normocephalic and atraumatic healed scab on the right side along the mandible (site of prior furuncle), no edema, erythema or induration  Palpation: Facial skeleton intact without bony stepoffs Salivary Glands: No mass or tenderness Facial Strength: Facial motility symmetric and full bilaterally ENT Pinna: External ear intact and fully developed External canal: Canal is patent with intact skin Tympanic Membrane: Clear and mobile External Nose: No scar or anatomic deformity Internal Nose: Septum is relatively straight on anterior rhinoscopy. No polyps.  Lips, Teeth, and gums: Mucosa and teeth intact and viable TMJ: No pain to palpation with full mobility Oral cavity/oropharynx: No erythema or exudate, no lesions present Neck Neck and Trachea: Midline trachea without mass or lesion Thyroid : No mass or nodularity Lymphatics: No lymphadenopathy   Studies Reviewed: CT neck w/contrast 05/29/23 IMPRESSION: 1. Broad-based  Right Facial Cellulitis. Regional soft tissue edema and inflammation with no abscess or gas. Small reactive right level 1B lymph nodes.   2. 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   3. Age advanced cervical spine disc and endplate degeneration at C5-C6.  Assessment/Plan: Encounter Diagnosis  Name Primary?   Facial cellulitis Yes    Assessment and Plan Assessment & Plan Soft tissue infection cellulitis of the face  Acute infection improved with IV antibiotics, no abscess on CT neck, likely from infected hair follicle, small residual induration on exam, otherwise initial swelling completely resolved. She still has a few  tabs of Doxy at home, and was instructed to finish - Complete 10-day course of Doxy as prescribed - Monitor for worsening infection signs, return if increased swelling or redness. - return if sx worsen otherwise f/u PRN   Thank you for allowing me to participate in the care of this patient. Please do not hesitate to contact me with any questions or concerns.   Artice Last, MD Otolaryngology [facility] Phone: [phone] Fax: [phone]    06/07/2023, 12:44 PM

## 2023-06-15 ENCOUNTER — Encounter: Payer: Self-pay | Admitting: Internal Medicine

## 2023-09-01 ENCOUNTER — Encounter: Payer: Self-pay | Admitting: Physician Assistant

## 2023-09-07 ENCOUNTER — Telehealth (INDEPENDENT_AMBULATORY_CARE_PROVIDER_SITE_OTHER): Payer: Self-pay

## 2023-09-07 NOTE — Telephone Encounter (Signed)
 Patient's spouse called for patient. He says that Dr. Soldatova seen patient in hospital. Patient has MRSA outbreaks on her face and he wants to know if she should go to PCP or come here for treatment after hospital. Please advise.

## 2023-09-08 ENCOUNTER — Telehealth (INDEPENDENT_AMBULATORY_CARE_PROVIDER_SITE_OTHER): Payer: Self-pay

## 2023-09-08 NOTE — Telephone Encounter (Signed)
 Mailbox full

## 2023-09-28 ENCOUNTER — Ambulatory Visit: Admitting: Infectious Diseases

## 2023-10-01 ENCOUNTER — Ambulatory Visit (INDEPENDENT_AMBULATORY_CARE_PROVIDER_SITE_OTHER): Admitting: Infectious Diseases

## 2023-10-01 ENCOUNTER — Encounter: Payer: Self-pay | Admitting: Infectious Diseases

## 2023-10-01 ENCOUNTER — Other Ambulatory Visit: Payer: Self-pay

## 2023-10-01 VITALS — BP 111/70 | HR 74 | Temp 97.7°F | Ht 73.0 in | Wt 165.0 lb

## 2023-10-01 DIAGNOSIS — L03211 Cellulitis of face: Secondary | ICD-10-CM | POA: Diagnosis not present

## 2023-10-01 DIAGNOSIS — Z79899 Other long term (current) drug therapy: Secondary | ICD-10-CM

## 2023-10-01 DIAGNOSIS — A4902 Methicillin resistant Staphylococcus aureus infection, unspecified site: Secondary | ICD-10-CM | POA: Diagnosis not present

## 2023-10-01 MED ORDER — DOXYCYCLINE HYCLATE 100 MG PO TABS: 100.0000 mg | ORAL_TABLET | Freq: Two times a day (BID) | ORAL | 0 refills | Status: AC

## 2023-10-01 NOTE — Progress Notes (Signed)
 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 For anxiety   AMPHETAMINE -DEXTROAMPHETAMINE  (ADDERALL) 30 MG TABLET    Take 1 tablet by mouth daily as needed (as needed for adhd).   BUPROPION  (WELLBUTRIN  XL) 150 MG 24 HR TABLET    Take 450 mg by mouth daily.   FLUOXETINE  (PROZAC ) 20 MG CAPSULE    Take 20 mg by mouth daily.   FLUOXETINE  (PROZAC ) 40 MG CAPSULE    Take 40 mg by mouth daily.   MUPIROCIN  OINTMENT (BACTROBAN ) 2 %    Apply 1 Application topically 2 (two) times daily. To affected facial area   TOPIRAMATE  (TOPAMAX ) 100 MG TABLET    Take 300 mg by mouth at bedtime.   TRAMADOL  (ULTRAM ) 50 MG TABLET    Take 1 tablet (50 mg total) by mouth every 6 (six) hours as needed for moderate pain (pain score 4-6).  Modified Medications   No medications on file  Discontinued Medications   No medications on file    Subjective: Discussed the use of AI scribe software for clinical note transcription with the patient, who gave verbal consent to proceed.    48 year old female with prior history of ADHD, anxiety, depression, OCD, seizure, anemia, GERD who is referred from PCP for concern for MRSA infection.  Patient reports early May she developed a pustule on her face, leading to hospitalization from May 3rd to May 6th. She received vancomycin  and ceftriaxone  in the hospital and switched to doxycycline  and Augmentin  on discharge, which initially cleared the lesions, but they recurred on her face after stopping antibiotics.  In June, a persistent lesion developed on her nose, lasting three months. Recently, two lesions appeared in her genital area after shaving, suspected to be ingrown hairs. Despite a two-week course of doxycycline  completed four days ago, the lesions persist. Some lesions have lasted up to three months, causing significant discomfort, especially in the genital area. Adhesive bandages have been used for coverage.  She reports one of the lesions was cultured by Dermatology and confirmed MRSA. She denies any history of similar infections including armpits, groin. Denies diabetes, or hypertension, and typically has low blood pressure.   Her social history includes occasional [employer] use, with no smoking or alcohol use. She currently feels well with no fever, chills, nausea, vomiting, or diarrhea. She is using mupirocin  in her nares and bleach bath but reports being inconsistent.   Review of Systems: all systems reviewed with pertinent positives and negatives as listed above  Past Medical History:  Diagnosis Date   Acute posterior anal fissure    ADHD (attention deficit hyperactivity disorder)    Anemia    Anorexia    history of eating disorder   Anxiety    Chronic idiopathic constipation    Depression    Epilepsy (HCC)    Family history of bladder cancer    Family history of breast cancer    Family history of ovarian cancer    GERD (gastroesophageal reflux disease)    Obsessive compulsive disorder    OCD (obsessive  compulsive disorder)    Seizures (HCC) 05/17/14   Past Surgical History:  Procedure Laterality Date   BREAST LUMPECTOMY WITH RADIOACTIVE SEED LOCALIZATION Right 10/05/2019   Procedure: RIGHT BREAST LUMPECTOMY WITH RADIOACTIVE SEED LOCALIZATION;  Surgeon: Vanderbilt Ned, MD;  Location: [facility];  Service: General;  Laterality: Right;   LIPOSUCTION TRUNK     TONSILLECTOMY      Social History   Tobacco Use   Smoking status: Never   Smokeless tobacco: Never  Substance Use Topics   Alcohol use: Yes    Alcohol/week: 1.0 standard drink of alcohol    Types: 1 Glasses of wine per week    Comment: occasional wine   Drug use: No    Family History  Problem Relation Age of Onset   Parkinsonism Mother 57   Hypertension Father    Stroke Father    Lymphoma Father 105   Colon polyps Father    Colon polyps Brother        at least 3   Rheum arthritis Maternal Grandfather    Parkinsonism Maternal Grandfather    Diabetes Maternal Grandmother    Parkinsonism Paternal Grandmother    Autism spectrum disorder Daughter        18q12.2 del   Skin cancer Maternal Aunt        BCC and SCC   Breast cancer Paternal Aunt 78   Ovarian cancer Paternal Aunt        dx in her early 41s   Bladder Cancer Paternal Aunt        dx in her 1s   Drug abuse Paternal Uncle    Drug abuse Paternal Aunt    Colon cancer Neg Hx    Esophageal cancer Neg Hx    Stomach cancer Neg Hx    Liver disease Neg Hx    Pancreatic cancer Neg Hx     Allergies  Allergen Reactions   Ciprofloxacin Hives   Percocet [Oxycodone-Acetaminophen ] Nausea Only    Health Maintenance  Topic Date Due   Hepatitis C Screening  Never done   DTaP/Tdap/Td (1 - Tdap) Never done   Hepatitis B Vaccines 19-59 Average Risk (1 of 3 - 19+ 3-dose series) Never done   HPV VACCINES (1 - 3-dose SCDM series) Never done   Cervical Cancer Screening (HPV/Pap Cotest)  Never done   Colonoscopy  Never done   Influenza Vaccine  08/27/2023    COVID-19 Vaccine (2 - 2025-26 season) 09/27/2023   HIV Screening  Completed   Pneumococcal Vaccine  Aged Out   Meningococcal B Vaccine  Aged Out    Objective: BP 111/70   Pulse 74   Temp 97.7 F (36.5 C) (Temporal)   Ht 6' 1 (1.854 m)   Wt 165 lb (74.8 kg)   SpO2 100%   BMI 21.77 kg/m   Physical Exam Constitutional:      Appearance: Normal appearance.  HENT:  Head: Normocephalic and atraumatic.      Mouth: Mucous membranes are moist.  Eyes:    Conjunctiva/sclera: Conjunctivae normal.     Pupils: Pupils are equal, round, and b/l symmetrical    Cardiovascular:     Rate and Rhythm: Normal rate and regular rhythm.     Heart sounds: S1S2  Pulmonary:     Effort: Pulmonary effort is normal.     Breath sounds: Normal breath sounds.   Abdominal:     General: Non distended     Palpations: soft, Non tender   GU exam ( chaperoned) - minimal indurated area in the left groin with small superficial wound with blood. No fluctuance or crepitus or warmth or tenderness   Musculoskeletal:        General: Normal range of motion.   Skin:    General: Skin is warm and dry.     Comments: Prior cellulitis and skin lesions in face resolved, no signs of active infection  Neurological:     General: grossly non focal     Mental Status: awake, alert and oriented to person, place, and time.   Psychiatric:        Mood and Affect: Mood normal.   Lab Results Lab Results  Component Value Date   WBC 9.6 06/01/2023   HGB 12.6 06/01/2023   HCT 39.8 06/01/2023   MCV 98.8 06/01/2023   PLT 244 06/01/2023    Lab Results  Component Value Date   CREATININE 0.73 06/01/2023   BUN 13 06/01/2023   NA 137 06/01/2023   K 3.7 06/01/2023   CL 111 06/01/2023   CO2 21 (L) 06/01/2023    Lab Results  Component Value Date   ALT 12 05/30/2023   AST 27 05/30/2023   ALKPHOS 68 05/30/2023   BILITOT 0.5 05/30/2023    No results found for: CHOL, HDL, LDLCALC, LDLDIRECT, TRIG,  CHOLHDL Lab Results  Component Value Date   LABRPR NON REACTIVE 08/15/2009   No results found for: HIV1RNAQUANT, HIV1RNAVL, CD4TABS  Microbiology Results for orders placed or performed during the hospital encounter of 05/29/23  Blood culture (routine x 2)     Status: None   Collection Time: 05/29/23  5:36 AM   Specimen: BLOOD LEFT FOREARM  Result Value Ref Range Status   Specimen Description   Final    BLOOD LEFT FOREARM Performed at [facility], [address], [place], [place] [address]    Special Requests   Final    BOTTLES DRAWN AEROBIC AND ANAEROBIC Blood Culture adequate volume Performed at [facility], [address] [place], [place] [address]    Culture   Final    NO GROWTH 5 DAYS Performed at [facility], [address] [place], [place] [address]    Report Status 06/03/2023 FINAL  Final  Blood culture (routine x 2)     Status: None   Collection Time: 05/29/23  5:42 AM   Specimen: BLOOD  Result Value Ref Range Status   Specimen Description   Final    BLOOD LEFT ANTECUBITAL Performed at [facility], [address], [place], [place] [address]    Special Requests   Final    BOTTLES DRAWN AEROBIC AND ANAEROBIC Blood Culture adequate volume Performed at [facility], [address], [place], [place] [address]    Culture   Final    NO GROWTH 5 DAYS Performed at [facility], [address] [place], [place] [address]  Report Status 06/03/2023 FINAL  Final    Assessment/Plan # h/o Facial cellulitis/abscess  - resolved after antibiotics - fu as needed/new concerns   # Folliculitis in left groin  - Doxycycline  100 mg po bid for 7 days  - fu if  no improvement   # MRSA colonization with recurrent infections  - discussed about staph decolonization techniques as below. She says has mupirocin  at home and does not need refills.   ?Nasal decolonization with  mupirocin  ointment (2%) applied to nares twice daily for 5 to 10 days, and ?Topical body decolonization (one of the following):  Chlorhexidine  gluconate (2% or 4% solution): daily washes or use of a disposable impregnated cloth for 5 to 14 days, or Dilute bleach baths (one teaspoon bleach per gallon of water, or one-fourth cup bleach per one-fourth tub [approximately 13 gallons of water] for 15 minutes twice weekly) for approximately three months   I spent 66 minutes involved in face-to-face and non-face-to-face activities for this patient on the day of the visit. Professional time spent includes the following activities: Preparing to see the patient (review of tests), Obtaining and reviewing separately obtained history (Outside referral notes, discharge record 5/6), Performing a medically appropriate examination and evaluation,  Documenting clinical information in the EMR, Independently interpreting results (not separately reported), Communicating results to the patient, Counseling and educating the patient and Care coordination (not separately reported).   Of note, portions of this note may have been created with voice recognition software. While this note has been edited for accuracy, occasional wrong-word or 'sound-a-like' substitutions may have occurred due to the inherent limitations of voice recognition software.   Annalee Joseph, MD [facility] 10/01/2023, 10:06 AM

## 2023-10-06 ENCOUNTER — Other Ambulatory Visit: Payer: Self-pay

## 2023-10-06 ENCOUNTER — Emergency Department (HOSPITAL_BASED_OUTPATIENT_CLINIC_OR_DEPARTMENT_OTHER)
Admission: EM | Admit: 2023-10-06 | Discharge: 2023-10-06 | Disposition: A | Discharge status: Home / Self Care | Source: Ambulatory Visit | Attending: Emergency Medicine | Admitting: Emergency Medicine

## 2023-10-06 ENCOUNTER — Encounter: Payer: Self-pay | Admitting: Infectious Diseases

## 2023-10-06 DIAGNOSIS — Z8614 Personal history of Methicillin resistant Staphylococcus aureus infection: Secondary | ICD-10-CM | POA: Insufficient documentation

## 2023-10-06 DIAGNOSIS — L089 Local infection of the skin and subcutaneous tissue, unspecified: Secondary | ICD-10-CM

## 2023-10-06 DIAGNOSIS — L0889 Other specified local infections of the skin and subcutaneous tissue: Secondary | ICD-10-CM | POA: Diagnosis present

## 2023-10-06 LAB — CBC
HCT: 40 % (ref 36.0–46.0)
Hemoglobin: 13.5 g/dL (ref 12.0–15.0)
MCH: 30.7 pg (ref 26.0–34.0)
MCHC: 33.8 g/dL (ref 30.0–36.0)
MCV: 90.9 fL (ref 80.0–100.0)
Platelets: 219 K/uL (ref 150–400)
RBC: 4.4 MIL/uL (ref 3.87–5.11)
RDW: 13.5 % (ref 11.5–15.5)
WBC: 7.3 K/uL (ref 4.0–10.5)
nRBC: 0 % (ref 0.0–0.2)

## 2023-10-06 LAB — BASIC METABOLIC PANEL WITH GFR
Anion gap: 12 (ref 5–15)
BUN: 11 mg/dL (ref 6–20)
CO2: 22 mmol/L (ref 22–32)
Calcium: 9.4 mg/dL (ref 8.9–10.3)
Chloride: 108 mmol/L (ref 98–111)
Creatinine, Ser: 0.98 mg/dL (ref 0.44–1.00)
GFR, Estimated: >60 mL/min (ref >60)
Glucose, Bld: 75 mg/dL (ref 70–99)
Potassium: 3.5 mmol/L (ref 3.5–5.1)
Sodium: 141 mmol/L (ref 135–145)

## 2023-10-06 MED ORDER — VANCOMYCIN HCL IN DEXTROSE 1-5 GM/200ML-% IV SOLN: 1000.0000 mg | Freq: Once | INTRAVENOUS | Status: DC

## 2023-10-06 MED ORDER — DEXTROSE 5 % IV SOLN
1500.0000 mg | Freq: Once | INTRAVENOUS | Status: AC
  Administered 2023-10-06: 1500 mg via INTRAVENOUS
  Filled 2023-10-06: qty 75

## 2023-10-06 NOTE — ED Triage Notes (Signed)
 MRSA on face x 6 weeks and not getting betting with doxy.

## 2023-10-06 NOTE — Telephone Encounter (Signed)
 Spoke with patient's husband and confirmed that ED is the recommendation. He will have her go to either [facility] or [facility].   He has additional questions about the treatment course and recovery from MRSA. Advised that ID provider would be better able to answer his questions once she's been evaluated.   Tyheem Boughner, BSN, RN

## 2023-10-06 NOTE — Telephone Encounter (Signed)
 Patient's husband called to follow up, reports dermatologist drained facial abscess yesterday. He says the abscess as migrated across her chin and is much worse than yesterday. He is unsure if she has had a fever, but he thinks she has had chills.   Discussed that emergency department evaluation would be best due to the rapid progression of symptoms. He would like to hear from the provider as he thinks his wife will be reluctant to go to the ED. Secure chat sent to Dr. Dea.   Husband: [phone]  Duwaine Lowe, BSN, RN

## 2023-10-06 NOTE — Progress Notes (Signed)
 Spoke with Julia [name], new concerns for infection in her chin and nose. Seen by Dermatology yesterday and they re-cultured. Still taking doxycycline  and given concerns for progressing infection while on PO antibiotic, recommended to go to ED for evaluation.

## 2023-10-06 NOTE — Progress Notes (Signed)
 Pharmacy Note: dalbavancin (DALVANCE ) for Acute Bacterial Skin and Skin Structure Infection (ABSSSI) Patients to [facility] Discharge   Julia [name] is a 48 y.o. female who presented to [facility] on 10/06/2023 with an ABSSSI.  Inclusion criteria:  Indication: Cellulitis  Patient has at least one SIRS criteria present: Temp > 100.9 or < 96.8   Exclusion criteria: Patient was evaluated and negative for hardware involvement, hypotension / shock, elevated lactate > 2 without other explanation, gram-negative infection risk factors (bites, water exposure, infection after trauma, infection after skin graft, burns, severe immunocompromise), necrotizing fasciitis possible / confirmed, known or suspected osteomyelitis or septic arthritis, endocarditis, diabetic foot infection, ischemic ulcers, post-operative wound infection, perirectal infection, need for drainage in the OR, hand / facial infections, injection drug users with  fever, bacteremia, pregnancy or breastfeeding, allergy related to antibiotics like vancomycin , known liver disease ( T.Bili > 2x ULN or AST/ALT > 3x ULN).  Dorn Poot 10/06/2023, 6:47 PM Clinical Pharmacist

## 2023-10-06 NOTE — Telephone Encounter (Signed)
 Patient husband called concerned about flare up of facial cellulitis. Patient husband said patient is in a lot of pain. Unsure if patient has a fever. Advised them that we do not currently have any availability this week so patient may need to go to ED to be evaluated. Routing to provider.   Julia [name] SHAUNNA Letters, CMA

## 2023-10-06 NOTE — ED Notes (Signed)
 Meds held due to needing to be delivered from [facility]... Pt and Provider aware.SABRASABRA

## 2023-10-06 NOTE — Discharge Instructions (Signed)
 you were given an IV dose of dalbavancin that lasts for 1 week.  Follow-up with your infectious disease doctor to make sure your infection resolves

## 2023-10-06 NOTE — ED Provider Notes (Signed)
 [place] EMERGENCY DEPARTMENT AT [facility] Provider Note   CSN: [id number] Arrival date & time: 10/06/23  1731     Patient presents with: MRSA Flare Up   Julia [name] is a 48 y.o. female.   HPI   Patient has a history of anxiety OCD ADHD epilepsy acid reflux MRSA.  Patient was admitted to the hospital earlier this year for MRSA cellulitis.  Patient started to notice some recurrent lesions on her face this past week.  They started after she had a visit with her infectious disease doctor.  Patient went to her dermatologist who did a biopsy.  Patient has been taking doxycycline  but felt that the rash was getting worse in the last couple days.  She sent images of the rash to her infectious disease doctor and was instructed to come to the ED for IV antibiotics.  Patient denies any fevers or chills.  No systemic symptoms  Prior to Admission medications   Medication Sig Start Date End Date Taking? Authorizing Provider  ALPRAZolam (XANAX) 1 MG tablet Take 1 mg by mouth daily as needed for anxiety. For anxiety    [provider]  amphetamine -dextroamphetamine  (ADDERALL) 30 MG tablet Take 1 tablet by mouth daily as needed (as needed for adhd). 06/01/23   Cheryle Page, MD  buPROPion  (WELLBUTRIN  XL) 150 MG 24 hr tablet Take 450 mg by mouth daily. 01/07/17   [provider]  doxycycline  (VIBRA -TABS) 100 MG tablet Take 1 tablet (100 mg total) by mouth 2 (two) times daily for 7 days. 10/01/23 10/08/23  Manandhar, Sabina, MD  FLUoxetine  (PROZAC ) 20 MG capsule Take 20 mg by mouth daily. 10/24/20   [provider]  FLUoxetine  (PROZAC ) 40 MG capsule Take 40 mg by mouth daily.    [provider]  mupirocin  ointment (BACTROBAN ) 2 % Apply 1 Application topically 2 (two) times daily. To affected facial area 06/01/23   Cheryle Page, MD  topiramate  (TOPAMAX ) 100 MG tablet Take 300 mg by mouth at bedtime. 11/28/15   [provider]  traMADol  (ULTRAM ) 50 MG  tablet Take 1 tablet (50 mg total) by mouth every 6 (six) hours as needed for moderate pain (pain score 4-6). Patient not taking: Reported on 10/01/2023 06/01/23   Cheryle Page, MD    Allergies: Ciprofloxacin and Percocet [oxycodone-acetaminophen ]    Review of Systems  Updated Vital Signs BP 107/72 (BP Location: Right Arm)   Pulse 67   Temp 98.8 F (37.1 C)   Resp 18   SpO2 100%   Physical Exam Vitals and nursing note reviewed.  Constitutional:      General: She is not in acute distress.    Appearance: She is well-developed.  HENT:     Head: Normocephalic and atraumatic.     Right Ear: External ear normal.     Left Ear: External ear normal.  Eyes:     General: No scleral icterus.       Right eye: No discharge.        Left eye: No discharge.     Conjunctiva/sclera: Conjunctivae normal.  Neck:     Trachea: No tracheal deviation.  Cardiovascular:     Rate and Rhythm: Normal rate.  Pulmonary:     Effort: Pulmonary effort is normal. No respiratory distress.     Breath sounds: No stridor.  Abdominal:     General: There is no distension.  Musculoskeletal:        General: No swelling or deformity.  Cervical back: Neck supple.  Skin:    General: Skin is warm and dry.     Findings: Rash present.     Comments: Patient has small superficial lesion on her nose, also has an area of erythema and mild induration on her chin, no lymphangitic streaking  Neurological:     Mental Status: She is alert. Mental status is at baseline.     Cranial Nerves: No dysarthria or facial asymmetry.     Motor: No seizure activity.     (all labs ordered are listed, but only abnormal results are displayed) Labs Reviewed  CBC  BASIC METABOLIC PANEL WITH GFR    EKG: None  Radiology: No results found.   Procedures   Medications Ordered in the ED  dalbavancin (DALVANCE ) 1,500 mg in dextrose  5 % 500 mL IVPB (1,500 mg Intravenous New Bag/Given 10/06/23 2109)    Clinical Course as of  10/06/23 2118  Wed Oct 06, 2023  1832 Case discussed with Dr Raylene.  She recommends a dose of dalbavancin [JK]    Clinical Course User Index [JK] Randol Simmonds, MD                                 Medical Decision Making Amount and/or Complexity of Data Reviewed Labs: ordered.  Patient has history of MRSA.  She is already been on doxycycline .  I discussed the case with Dr. Juli who recommends giving the patient a dose of dalbavancin.   Patient is afebrile here.  Her labs are unremarkable.  She does not appear systemically ill.  I do not feel that hospitalization is required will have her follow-up with her infectious disease doctor to be rechecked.     Final diagnoses:  Skin infection  History of MRSA infection    ED Discharge Orders          Ordered    Ambulatory referral to Infectious Disease       Comments: Cellulitis patient:  Received dalbavancin on 10/06/2023.   10/06/23 1831               Randol Simmonds, MD 10/06/23 2118

## 2023-10-11 DIAGNOSIS — Z79899 Other long term (current) drug therapy: Secondary | ICD-10-CM | POA: Insufficient documentation

## 2023-10-27 ENCOUNTER — Encounter: Payer: Self-pay | Admitting: Physician Assistant

## 2023-10-27 ENCOUNTER — Ambulatory Visit (INDEPENDENT_AMBULATORY_CARE_PROVIDER_SITE_OTHER): Admitting: Physician Assistant

## 2023-10-27 VITALS — BP 102/60 | HR 83 | Ht 73.0 in | Wt 166.2 lb

## 2023-10-27 DIAGNOSIS — D509 Iron deficiency anemia, unspecified: Secondary | ICD-10-CM

## 2023-10-27 DIAGNOSIS — Z1211 Encounter for screening for malignant neoplasm of colon: Secondary | ICD-10-CM

## 2023-10-27 DIAGNOSIS — K5909 Other constipation: Secondary | ICD-10-CM

## 2023-10-27 DIAGNOSIS — K625 Hemorrhage of anus and rectum: Secondary | ICD-10-CM

## 2023-10-27 DIAGNOSIS — K219 Gastro-esophageal reflux disease without esophagitis: Secondary | ICD-10-CM

## 2023-10-27 DIAGNOSIS — K5904 Chronic idiopathic constipation: Secondary | ICD-10-CM

## 2023-10-27 DIAGNOSIS — K649 Unspecified hemorrhoids: Secondary | ICD-10-CM

## 2023-10-27 MED ORDER — PANTOPRAZOLE SODIUM 20 MG PO TBEC: 20.0000 mg | DELAYED_RELEASE_TABLET | Freq: Every day | ORAL | 3 refills | Status: AC

## 2023-10-27 MED ORDER — NA SULFATE-K SULFATE-MG SULF 17.5-3.13-1.6 GM/177ML PO SOLN: 1.0000 | Freq: Once | ORAL | 0 refills | Status: AC

## 2023-10-27 NOTE — Patient Instructions (Signed)
 We have sent the following medications to your pharmacy for you to pick up at your convenience: Pantoprazole  20 mg once daily  We have given you samples of the following medication to take: Linzess  72 mcg and Linzess  145 mcg  Please call or send us  a MyChart message letting us  know which dose works best for you and we will send a prescription.  You have been scheduled for an Endoscopy and Colonoscopy. Please follow the written instructions given to you at your visit today.  If you use inhalers (even only as needed), please bring them with you on the day of your procedure.  DO NOT TAKE 7 DAYS PRIOR TO TEST- Trulicity (dulaglutide) Ozempic, Wegovy (semaglutide) Mounjaro (tirzepatide) Bydureon Bcise (exanatide extended release)  DO NOT TAKE 1 DAY PRIOR TO YOUR TEST Rybelsus (semaglutide) Adlyxin (lixisenatide) Victoza (liraglutide) Byetta (exanatide) ___________________________________________________________________________  Please follow up sooner if symptoms increase or worsen __________________________________________________________________________  Due to recent changes in healthcare laws, you may see the results of your imaging and laboratory studies on MyChart before your provider has had a chance to review them.  We understand that in some cases there may be results that are confusing or concerning to you. Not all laboratory results come back in the same time frame and the provider may be waiting for multiple results in order to interpret others.  Please give us  48 hours in order for your provider to thoroughly review all the results before contacting the office for clarification of your results.   Thank you for trusting me with your gastrointestinal care!   Ellouise Console, PA-C _______________________________________________________  If your blood pressure at your visit was 140/90 or greater, please contact your primary care physician to follow up on  this.  _______________________________________________________  If you are age 46 or older, your body mass index should be between 23-30. Your Body mass index is 21.93 kg/m. If this is out of the aforementioned range listed, please consider follow up with your Primary Care Provider.  If you are age 46 or younger, your body mass index should be between 19-25. Your Body mass index is 21.93 kg/m. If this is out of the aformentioned range listed, please consider follow up with your Primary Care Provider.   ________________________________________________________  The [facility] providers would like to encourage you to use MYCHART to communicate with providers for non-urgent requests or questions.  Due to long hold times on the telephone, sending your provider a message by [facility] may be a faster and more efficient way to get a response.  Please allow 48 business hours for a response.  Please remember that this is for non-urgent requests.  _______________________________________________________

## 2023-10-27 NOTE — Progress Notes (Signed)
 Ellouise Console, PA-C [address] [place], [place]  [address] Phone: [phone]   Gastroenterology Consultation  Referring Provider:     Loreli Elsie JONETTA Mickey., MD Primary Care Physician:  Loreli Elsie JONETTA Mickey., MD Primary Gastroenterologist:  Ellouise Console, PA-C / Dr. Gordy Starch  Reason for Consultation:     Iron deficiency anemia, GERD, discuss EGD and colonoscopy        HPI:   Julia [name] is a 48 y.o. y/o female referred for consultation & management  by Loreli Elsie JONETTA Mickey., MD.    New patient.  Referred for iron deficiency anemia and GERD.  Discussed EGD and colonoscopy.  Patient is due for her first screening colonoscopy due to age.  No family history of colon or GI cancers.  She has chronic constipation for many years.  Takes MiraLAX  sporadically, but not consistent.  Typically has bowel movement once per week with large hard painful stools.  She has abdominal cramping which is relieved after bowel movement.  She admits to mild intermittent episodes of rectal bleeding attributed to hemorrhoids.  She also admits to acid reflux.  Has taken Nexium, pantoprazole , and Prevacid with little benefit.  Admits to breakthrough heartburn episodes on PPI.  No previous EGD.    Has history of iron deficiency anemia.  She had a uterine ablation many years ago, and has not had a menstrual cycle in several years.  Does not donate blood.  In May of this year her hemoglobin dropped from 12.6-11.8.  Treated with oral iron.  Most recent labs 10/06/2023 showed improved hemoglobin 13.5.  Past Medical History:  Diagnosis Date   Acute posterior anal fissure    ADHD (attention deficit hyperactivity disorder)    Anemia    Anorexia    history of eating disorder   Anxiety    Chronic idiopathic constipation    Depression    Epilepsy (HCC)    Family history of bladder cancer    Family history of breast cancer    Family history of ovarian cancer    GERD (gastroesophageal reflux disease)    IBS  (irritable bowel syndrome)    Obsessive compulsive disorder    OCD (obsessive compulsive disorder)    Seizures (HCC) 05/17/2014    Past Surgical History:  Procedure Laterality Date   BREAST LUMPECTOMY WITH RADIOACTIVE SEED LOCALIZATION Right 10/05/2019   Procedure: RIGHT BREAST LUMPECTOMY WITH RADIOACTIVE SEED LOCALIZATION;  Surgeon: Vanderbilt Ned, MD;  Location: [facility];  Service: General;  Laterality: Right;   LIPOSUCTION TRUNK     TONSILLECTOMY      Prior to Admission medications   Medication Sig Start Date End Date Taking? Authorizing Provider  ALPRAZolam (XANAX) 1 MG tablet Take 1 mg by mouth daily as needed for anxiety. For anxiety    [provider]  amphetamine -dextroamphetamine  (ADDERALL) 30 MG tablet Take 1 tablet by mouth daily as needed (as needed for adhd). 06/01/23   Cheryle Page, MD  buPROPion  (WELLBUTRIN  XL) 150 MG 24 hr tablet Take 450 mg by mouth daily. 01/07/17   [provider]  FLUoxetine  (PROZAC ) 20 MG capsule Take 20 mg by mouth daily. 10/24/20   [provider]  FLUoxetine  (PROZAC ) 40 MG capsule Take 40 mg by mouth daily.    [provider]  mupirocin  ointment (BACTROBAN ) 2 % Apply 1 Application topically 2 (two) times daily. To affected facial area 06/01/23   Cheryle Page, MD  topiramate  (TOPAMAX ) 100 MG  tablet Take 300 mg by mouth at bedtime. 11/28/15   [provider]  traMADol  (ULTRAM ) 50 MG tablet Take 1 tablet (50 mg total) by mouth every 6 (six) hours as needed for moderate pain (pain score 4-6). Patient not taking: Reported on 10/01/2023 06/01/23   Cheryle Page, MD    Family History  Problem Relation Age of Onset   Parkinsonism Mother 15   Hypertension Father    Stroke Father    Lymphoma Father 64   Colon polyps Father    Colon polyps Brother        at least 3   Rheum arthritis Maternal Grandfather    Parkinsonism Maternal Grandfather    Diabetes Maternal Grandmother    Parkinsonism  Paternal Grandmother    Autism spectrum disorder Daughter        18q12.2 del   Skin cancer Maternal Aunt        BCC and SCC   Breast cancer Paternal Aunt 32   Ovarian cancer Paternal Aunt        dx in her early 45s   Bladder Cancer Paternal Aunt        dx in her 53s   Drug abuse Paternal Uncle    Drug abuse Paternal Aunt    Colon cancer Neg Hx    Esophageal cancer Neg Hx    Stomach cancer Neg Hx    Liver disease Neg Hx    Pancreatic cancer Neg Hx      Social History   Tobacco Use   Smoking status: Never   Smokeless tobacco: Never  Substance Use Topics   Alcohol use: Yes    Alcohol/week: 1.0 standard drink of alcohol    Types: 1 Glasses of wine per week    Comment: occasional wine   Drug use: No    Allergies as of 10/27/2023 - Review Complete 10/06/2023  Allergen Reaction Noted   Ciprofloxacin Hives 12/20/2015   Percocet [oxycodone-acetaminophen ] Nausea Only 05/18/2010    Review of Systems:    All systems reviewed and negative except where noted in HPI.   Physical Exam:  BP 102/60   Pulse 83   Ht 6' 1 (1.854 m)   Wt 166 lb 4 oz (75.4 kg)   BMI 21.93 kg/m  No LMP recorded. Patient has had an ablation.  General:   Alert,  Well-developed, well-nourished, pleasant and cooperative in NAD Lungs:  Respirations even and unlabored.  Clear throughout to auscultation.   No wheezes, crackles, or rhonchi. No acute distress. Heart:  Regular rate and rhythm; no murmurs, clicks, rubs, or gallops. Abdomen:  Normal bowel sounds.  No bruits.  Soft, and non-distended without masses, hepatosplenomegaly or hernias noted.  No Tenderness.  No guarding or rebound tenderness.    Neurologic:  Alert and oriented x3;  grossly normal neurologically. Psych:  Alert and cooperative. Normal mood and affect.  Imaging Studies: No results found.  Labs: CBC    Component Value Date/Time   WBC 7.3 10/06/2023 1824   RBC 4.40 10/06/2023 1824   HGB 13.5 10/06/2023 1824   HCT 40.0 10/06/2023  1824   PLT 219 10/06/2023 1824   MCV 90.9 10/06/2023 1824    CMP     Component Value Date/Time   NA 141 10/06/2023 1824   K 3.5 10/06/2023 1824   CL 108 10/06/2023 1824   CO2 22 10/06/2023 1824   GLUCOSE 75 10/06/2023 1824   BUN 11 10/06/2023 1824   CREATININE 0.98 10/06/2023 1824  CALCIUM 9.4 10/06/2023 1824   PROT 5.9 (L) 05/30/2023 0501   ALBUMIN 3.0 (L) 05/30/2023 0501   AST 27 05/30/2023 0501   ALT 12 05/30/2023 0501   ALKPHOS 68 05/30/2023 0501   BILITOT 0.5 05/30/2023 0501   GFRNONAA >60 10/06/2023 1824   GFRAA >60 10/04/2019 1508    Assessment and Plan:   ALLISSA ALBRIGHT is a 48 y.o. y/o female has been referred for   1.  Iron deficiency anemia: Improved on oral iron. - Schedule EGD and colonoscopy I discussed risks of EGD and colonoscopy with patient to include risk of bleeding, perforation, and risk of sedation.  Patient expressed understanding and agrees to proceed with procedures.    2.  GERD - Rx pantoprazole  20 mg ONCE daily, #90, 3 refills. -She can also add OTC Pepcid 20 mg twice daily if needed. -Recommend Lifestyle Modifications to prevent Acid Reflux.  Rec. Avoid coffee, sodas, peppermint, garlic, onions, alcohol, citrus fruits, chocolate, tomatoes, fatty and spicey foods.  Avoid eating 2-3 hours before bedtime.    3.  Chronic constipation - Gave samples of Linzess  72 mcg QD for 1 week, then 145 mcg QD.  Patient will let me know which dose works best, and then we can send a prescription.   - Continue OTC MiraLAX  as needed.  4.  Colon cancer screening - Scheduling Colonoscopy I discussed risks of colonoscopy with patient to include risk of bleeding, colon perforation, and risk of sedation.  Patient expressed understanding and agrees to proceed with colonoscopy.   Follow up if symptoms worsen or fail to improve.  Also follow-up based on procedure results.  Ellouise Console, PA-C

## 2023-11-23 ENCOUNTER — Encounter (HOSPITAL_BASED_OUTPATIENT_CLINIC_OR_DEPARTMENT_OTHER): Attending: Internal Medicine | Admitting: Internal Medicine

## 2023-11-23 DIAGNOSIS — S0120XA Unspecified open wound of nose, initial encounter: Secondary | ICD-10-CM | POA: Insufficient documentation

## 2023-11-23 DIAGNOSIS — X58XXXA Exposure to other specified factors, initial encounter: Secondary | ICD-10-CM | POA: Insufficient documentation

## 2023-11-24 ENCOUNTER — Encounter (HOSPITAL_BASED_OUTPATIENT_CLINIC_OR_DEPARTMENT_OTHER): Admitting: Internal Medicine

## 2023-11-29 NOTE — Telephone Encounter (Signed)
 Referral in the system for Boil [L02.92] MRSA colonization [Z22.322]   Message to [facility] Center: Please contact pt to schedule a CONSULT with GID subgroup. Thank you.

## 2023-11-29 NOTE — Telephone Encounter (Signed)
 Referral has been added to the outbound dialer for 30 days.

## 2023-12-01 ENCOUNTER — Emergency Department (HOSPITAL_BASED_OUTPATIENT_CLINIC_OR_DEPARTMENT_OTHER): Admitting: Radiology

## 2023-12-01 ENCOUNTER — Emergency Department (HOSPITAL_BASED_OUTPATIENT_CLINIC_OR_DEPARTMENT_OTHER)
Admission: EM | Admit: 2023-12-01 | Discharge: 2023-12-02 | Disposition: A | Discharge status: Home / Self Care | Source: Ambulatory Visit

## 2023-12-01 ENCOUNTER — Emergency Department (HOSPITAL_COMMUNITY)

## 2023-12-01 ENCOUNTER — Other Ambulatory Visit: Payer: Self-pay

## 2023-12-01 DIAGNOSIS — D72829 Elevated white blood cell count, unspecified: Secondary | ICD-10-CM | POA: Insufficient documentation

## 2023-12-01 DIAGNOSIS — Z79899 Other long term (current) drug therapy: Secondary | ICD-10-CM | POA: Insufficient documentation

## 2023-12-01 DIAGNOSIS — J340 Abscess, furuncle and carbuncle of nose: Secondary | ICD-10-CM | POA: Insufficient documentation

## 2023-12-01 DIAGNOSIS — L0390 Cellulitis, unspecified: Secondary | ICD-10-CM

## 2023-12-01 LAB — CBC WITH DIFFERENTIAL/PLATELET
Abs Immature Granulocytes: 0.03 K/uL (ref 0.00–0.07)
Basophils Absolute: 0.1 K/uL (ref 0.0–0.1)
Basophils Relative: 0 %
Eosinophils Absolute: 0.2 K/uL (ref 0.0–0.5)
Eosinophils Relative: 2 %
HCT: 41.4 % (ref 36.0–46.0)
Hemoglobin: 14.2 g/dL (ref 12.0–15.0)
Immature Granulocytes: 0 %
Lymphocytes Relative: 28 %
Lymphs Abs: 3.2 K/uL (ref 0.7–4.0)
MCH: 31.8 pg (ref 26.0–34.0)
MCHC: 34.3 g/dL (ref 30.0–36.0)
MCV: 92.6 fL (ref 80.0–100.0)
Monocytes Absolute: 0.9 K/uL (ref 0.1–1.0)
Monocytes Relative: 8 %
Neutro Abs: 7 K/uL (ref 1.7–7.7)
Neutrophils Relative %: 62 %
Platelets: 236 K/uL (ref 150–400)
RBC: 4.47 MIL/uL (ref 3.87–5.11)
RDW: 12.8 % (ref 11.5–15.5)
WBC: 11.2 K/uL — ABNORMAL HIGH (ref 4.0–10.5)
nRBC: 0 % (ref 0.0–0.2)

## 2023-12-01 LAB — COMPREHENSIVE METABOLIC PANEL WITH GFR
ALT: 8 U/L (ref 0–44)
AST: 23 U/L (ref 15–41)
Albumin: 4.3 g/dL (ref 3.5–5.0)
Alkaline Phosphatase: 66 U/L (ref 38–126)
Anion gap: 11 (ref 5–15)
BUN: 12 mg/dL (ref 6–20)
CO2: 23 mmol/L (ref 22–32)
Calcium: 9 mg/dL (ref 8.9–10.3)
Chloride: 106 mmol/L (ref 98–111)
Creatinine, Ser: 1.03 mg/dL — ABNORMAL HIGH (ref 0.44–1.00)
GFR, Estimated: >60 mL/min (ref >60)
Glucose, Bld: 96 mg/dL (ref 70–99)
Potassium: 3.4 mmol/L — ABNORMAL LOW (ref 3.5–5.1)
Sodium: 139 mmol/L (ref 135–145)
Total Bilirubin: 0.5 mg/dL (ref 0.0–1.2)
Total Protein: 7 g/dL (ref 6.5–8.1)

## 2023-12-01 MED ORDER — GADOBUTROL 1 MMOL/ML IV SOLN
7.0000 mL | Freq: Once | INTRAVENOUS | Status: AC | PRN
  Administered 2023-12-01: 7 mL via INTRAVENOUS

## 2023-12-01 NOTE — ED Notes (Signed)
 Pt directed to go to [facility] ED for further continuation of care and imaging. Per EDP Barrett, Dr. Gavin at [facility] is accepting. Pt verbalized understanding and had no further questions at time of transfer. Pt CA&Ox4, ambulatory, and in NAD at time of transfer.

## 2023-12-01 NOTE — ED Triage Notes (Signed)
 Pt POV reporting wound on nose r/t MRSA, seen by wound specialist last week, advised to come to ED for xray.

## 2023-12-01 NOTE — ED Triage Notes (Signed)
 Pt transfer from [facility] facility for MRI

## 2023-12-01 NOTE — ED Provider Notes (Signed)
 [place] EMERGENCY DEPARTMENT AT [facility] Provider Note   CSN: [id number] Arrival date & time: 12/01/23  8082     Patient presents with: Wound Check   Julia [name] is a 48 y.o. female with OCD, anorexia, epilepsy, history of MRSA presents to emergency room with complaint of nonhealing nasal ulceration.  She reports that she has had this since the beginning of October.  She also reports that she has had similar problems in the past earlier in May of this year requiring admission and IV antibiotic.  She tells me that [facility] Hoffman sent her in to rule out osteomyelitis.  She tells me that this doctor told her to get an x-ray and if the x-ray is normal she will require an MRI.  She does not have any fever or chills.  This has not gotten any better or worse.  She does have mild rounding cellulitis. She has been to dermatology and apparently dermatology told her that this tissue was normal and not cancerous.    Wound Check       Prior to Admission medications   Medication Sig Start Date End Date Taking? Authorizing Provider  ALPRAZolam (XANAX) 1 MG tablet Take 1 mg by mouth daily as needed for anxiety. For anxiety    [provider]  amphetamine -dextroamphetamine  (ADDERALL) 30 MG tablet Take 1 tablet by mouth daily as needed (as needed for adhd). 06/01/23   Cheryle Page, MD  buPROPion  (WELLBUTRIN  XL) 150 MG 24 hr tablet Take 450 mg by mouth daily. 01/07/17   [provider]  FLUoxetine  (PROZAC ) 20 MG capsule Take 20 mg by mouth daily. 10/24/20   [provider]  FLUoxetine  (PROZAC ) 40 MG capsule Take 40 mg by mouth daily.    [provider]  mupirocin  ointment (BACTROBAN ) 2 % Apply 1 Application topically 2 (two) times daily. To affected facial area 06/01/23   Cheryle Page, MD  ondansetron  (ZOFRAN -ODT) 4 MG disintegrating tablet Take 4 mg by mouth every 8 (eight) hours as needed. 05/25/23   [provider]   pantoprazole  (PROTONIX ) 20 MG tablet Take 1 tablet (20 mg total) by mouth daily. 10/27/23   [place], PA-C  topiramate  (TOPAMAX ) 100 MG tablet Take 300 mg by mouth at bedtime. 11/28/15   [provider]    Allergies: Ciprofloxacin and Percocet [oxycodone-acetaminophen ]    Review of Systems  Skin:  Positive for wound.    Updated Vital Signs BP (!) 147/93 (BP Location: Right Arm)   Pulse 84   Temp 98.3 F (36.8 C)   Resp 17   Ht 6' 1 (1.854 m)   Wt 72.6 kg   SpO2 100%   BMI 21.11 kg/m   Physical Exam Vitals and nursing note reviewed.  Constitutional:      General: She is not in acute distress.    Appearance: She is not toxic-appearing.  HENT:     Head: Normocephalic and atraumatic.  Eyes:     General: No scleral icterus.    Conjunctiva/sclera: Conjunctivae normal.  Cardiovascular:     Rate and Rhythm: Normal rate and regular rhythm.     Pulses: Normal pulses.     Heart sounds: Normal heart sounds.  Pulmonary:     Effort: Pulmonary effort is normal. No respiratory distress.     Breath sounds: Normal breath sounds.  Abdominal:     General: Abdomen is flat. Bowel sounds are normal.     Palpations: Abdomen is soft.  Tenderness: There is no abdominal tenderness.  Skin:    General: Skin is warm and dry.     Findings: No lesion.     Comments: Wound to nose neurovascularly intact.  Neurological:     General: No focal deficit present.     Mental Status: She is alert and oriented to person, place, and time. Mental status is at baseline.        (all labs ordered are listed, but only abnormal results are displayed) Labs Reviewed  COMPREHENSIVE METABOLIC PANEL WITH GFR - Abnormal; Notable for the following components:      Result Value   Potassium 3.4 (*)    Creatinine, Ser 1.03 (*)    All other components within normal limits  CBC WITH DIFFERENTIAL/PLATELET - Abnormal; Notable for the following components:   WBC 11.2 (*)    All other components  within normal limits    EKG: None  Radiology: DG Nasal Bones Result Date: 12/01/2023 CLINICAL DATA:  Nonhealing wound on nasal area EXAM: NASAL BONES - 3+ VIEW COMPARISON:  None Available. FINDINGS: No fracture or malalignment. Skin ulcer or wound at the superficial soft tissues of the upper nasal area. IMPRESSION: Skin ulcer or wound at the superficial soft tissues of the upper nasal area. No acute osseous abnormality. Electronically Signed   By: Luke Bun M.D.   On: 12/01/2023 20:48     Procedures   Medications Ordered in the ED - No data to display  Clinical Course as of 12/01/23 2105  Wed Dec 01, 2023  2025 Wound doctor sent her for x-ray? Mri? Concern for osteo?  [JB]  2026 On doxy [JB]    Clinical Course User Index [JB] Harlis Champoux, Warren SAILOR, PA-C                                 Medical Decision Making Amount and/or Complexity of Data Reviewed Labs: ordered. Radiology: ordered.   This patient presents to the ED for concern of wound, this involves an extensive number of treatment options, and is a complaint that carries with it a high risk of complications and morbidity.  The differential diagnosis includes cellulitis, ulcer, abscess, osteomyelitis   Co morbidities that complicate the patient evaluation  Family history of ovarian cancer, breast cancer, bladder cancer Anxiety, OCD, ADHD   Additional history obtained:  Unfortunately my chart review is somewhat limited however I can see that patient was seen with infectious disease 11/29/2023 and appears to have had office visit with [facility] wound care 11/29/2023.   Lab Tests:  I personally interpreted labs.  The pertinent results include:   Leukocytosis of 11, CMP unremarkable   Imaging Studies ordered:  I ordered imaging studies including x-ray of nasal bones I independently visualized and interpreted imaging which showed no acute osteomyelitis I agree with the radiologist interpretation   Cardiac  Monitoring: / EKG:  The patient was maintained on a cardiac monitor.    Problem List / ED Course / Critical interventions / Medication management  Patient reports with nonhealing ulceration to proximal nose.  This has been ongoing for approximately 5 weeks.  She was recently started on doxycycline  and she has been using mupirocin  and wound care.  She is already established with wound care and dermatology.  On arrival she is hemodynamically stable and well-appearing.  She does have mild leukocytosis but no fever.  She does not meet SIRS criteria.  I did  obtain x-ray which shows no osteomyelitis.  Will order MRI of the face to definitively rule out osteomyelitis. I have reviewed the patients home medicines and have made adjustments as needed. If MRI is negative with no findings of osteomyelitis feel stable for discharge with outpatient follow-up, already on Abx, continue current management. Patient is going to ED to [facility] for MRI, private vehicle.        Final diagnoses:  None    ED Discharge Orders     None          Shermon Warren LOISE RIGGERS 12/01/23 2150    Ula Prentice SAUNDERS, MD 12/02/23 1929

## 2023-12-01 NOTE — ED Notes (Signed)
 MRI aware pt here

## 2023-12-01 NOTE — Discharge Instructions (Signed)
 Please go directly to [facility] emergency room and check in MRI is ordered.

## 2023-12-02 ENCOUNTER — Other Ambulatory Visit (HOSPITAL_COMMUNITY): Payer: Self-pay

## 2023-12-02 MED ORDER — DEXTROSE 5 % IV SOLN
1500.0000 mg | Freq: Once | INTRAVENOUS | Status: AC
  Administered 2023-12-02: 1500 mg via INTRAVENOUS
  Filled 2023-12-02: qty 75

## 2023-12-02 NOTE — ED Provider Notes (Signed)
  Physical Exam  BP 137/85 (BP Location: Right Arm)   Pulse 69   Temp 98 F (36.7 C)   Resp 16   Ht 6' 1 (1.854 m)   Wt 72.6 kg   SpO2 100%   BMI 21.11 kg/m   Physical Exam  Procedures  Procedures  ED Course / MDM   Clinical Course as of 12/02/23 0648  Wed Dec 01, 2023  2025 Wound doctor sent her for x-ray? Mri? Concern for osteo?  [JB]  2026 On doxy [JB]    Clinical Course User Index [JB] Barrett, Warren SAILOR, PA-C   Medical Decision Making Amount and/or Complexity of Data Reviewed Labs: ordered. Radiology: ordered.  Risk Prescription drug management.   Patient transferred from outside facility for MRI to evaluate for osteomyelitis of the nose.  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  Followed by infectious disease, dermatology, wound care.  MRI results show possible 8 x 8 mm area of signal irregularity concerning for infection.  I requested consultation with ENT, Dr. Tobie, who saw no indication at this time for surgical intervention.  Requested consultation with infectious disease, Dr. Juli, who recommended giving the patient a dose of Dalvance  in the emergency department and having him follow-up with infectious disease.  Dalvance  was ordered.  Patient will discharge home once Dalvance  infusion is completed and will follow-up with infectious disease.  Dr. Juli plans to have clinic reach out to schedule follow-up appointment.  Return precautions provided.       Logan Ubaldo NOVAK, PA-C 12/02/23 9350    Haze Lonni PARAS, MD 12/02/23 [phone]

## 2023-12-02 NOTE — Progress Notes (Signed)
 Pharmacy Note:  Dalbavancin for Acute Bacterial Skin and Skin Structure Infection (ABSSSI) Patients to [facility] Discharge Julia [name] is an 48 y.o. female who presented to [facility] on 12/01/2023 with an Acute Bacterial Skin and Skin Structure Infection  Discussed pt w/ ED PA Ubaldo High, who notes that dalbavancin was recommended by ID service.  Inclusion criteria - Indication []  Moderately large skin lesion (>=75 cm2 or larger - about the size of a baseball) [x]  Cellulitis  Inclusion Criteria - at least one SIRS criteria present []  WBC > 12,000 or < 4000 []  temp >100.9 or < 96.8 []  heart rate >90[]  respiratory rate >20  Patient was evaluated for the following exclusion criteria and no exclusions were found  Hardware involvement, Hypotension / shock, Elevated lactate (>2) without other explanation, gram-negative infection risk factors (bites, water exposure, infection after trauma, infection after skin graft, neutropenia, burns, severe immunocompromise), necrotizing fasciitis possible or confirmed, Known or suspected osteomyelitis or septic arthritis, endocarditis, diabetic foot infection, ischemic ulcers, post-operative wound infection, perirectal infections, need for drainage in the operating room, hand or facial infections, injection drug users with a fever, bacteremia, pregnancy or breastfeeding, allergy to related antibiotics like vancomycin , known liver disease (t.bili >2x ULN or AST/ALT 3x ULN)  Julia [name], PharmD, BCPS  12/02/2023, 6:24 AM Clinical Pharmacist

## 2023-12-07 ENCOUNTER — Ambulatory Visit: Admitting: Infectious Diseases

## 2023-12-07 ENCOUNTER — Other Ambulatory Visit: Payer: Self-pay

## 2023-12-07 ENCOUNTER — Encounter (HOSPITAL_BASED_OUTPATIENT_CLINIC_OR_DEPARTMENT_OTHER): Attending: Internal Medicine | Admitting: Internal Medicine

## 2023-12-07 ENCOUNTER — Encounter: Payer: Self-pay | Admitting: Infectious Diseases

## 2023-12-07 ENCOUNTER — Telehealth: Payer: Self-pay

## 2023-12-07 VITALS — BP 126/85 | HR 62 | Temp 97.6°F | Ht 73.0 in | Wt 170.0 lb

## 2023-12-07 DIAGNOSIS — S0120XA Unspecified open wound of nose, initial encounter: Secondary | ICD-10-CM

## 2023-12-07 DIAGNOSIS — A4902 Methicillin resistant Staphylococcus aureus infection, unspecified site: Secondary | ICD-10-CM

## 2023-12-07 DIAGNOSIS — Z452 Encounter for adjustment and management of vascular access device: Secondary | ICD-10-CM | POA: Diagnosis not present

## 2023-12-07 DIAGNOSIS — M869 Osteomyelitis, unspecified: Secondary | ICD-10-CM

## 2023-12-07 DIAGNOSIS — Z79899 Other long term (current) drug therapy: Secondary | ICD-10-CM

## 2023-12-07 MED ORDER — METRONIDAZOLE 500 MG PO TABS: 500.0000 mg | ORAL_TABLET | Freq: Two times a day (BID) | ORAL | 0 refills | Status: AC

## 2023-12-07 NOTE — Progress Notes (Signed)
 ID brief note  Diagnosis: osteomyelitis   Culture Result: no cultures, h/o MRSA  Allergies  Allergen Reactions   Ciprofloxacin Hives   Percocet [Oxycodone-Acetaminophen ] Nausea Only    OPAT Orders Discharge antibiotics to be given via PICC line Discharge antibiotics: Daptomycin 600mg  IV daily, ceftriaxone  2g iv daily  Per pharmacy protocol  Aim for Vancomycin  trough 15-20 or AUC 400-550 (unless otherwise indicated) Duration: 6 weeks  End Date: 6 weeks from start date  [facility] Care Per Protocol:  Home health RN for IV administration and teaching; PICC line care and labs.    Labs weekly while on IV antibiotics: X__ CBC with differential __ BMP X__ CMP X__ CRP X__ ESR __ Vancomycin  trough X__ CK  X__ Please pull PIC at completion of IV antibiotics __ Please leave PIC in place until doctor has seen patient or been notified  Fax weekly labs to [phone]  Clinic Follow Up Appt: 2-3 weeks   Annalee Orem, MD Infectious Disease Physician [facility] [address] [place], [place] [address] Phone: [phone]  Fax: [phone]   @

## 2023-12-07 NOTE — Progress Notes (Signed)
 Patient Active Problem List   Diagnosis Date Noted   Osteomyelitis (HCC) 12/07/2023   Needs peripherally inserted central catheter (PICC) 12/07/2023   Medication management 10/11/2023   MRSA infection 10/01/2023   Facial cellulitis 05/29/2023   Acne 11/07/2020   Cyst of ovary 11/07/2020   External hemorrhoids 11/07/2020   Genetic testing 09/19/2019   Family history of ovarian cancer    Family history of breast cancer    Family history of bladder cancer    Rectal pain 03/31/2017   Chronic constipation 03/31/2017   Gastroesophageal reflux disease 03/31/2017   Partial symptomatic epilepsy with complex partial seizures, not intractable, without status epilepticus (HCC) 06/20/2014   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 For anxiety   AMPHETAMINE -DEXTROAMPHETAMINE  (ADDERALL) 30 MG TABLET    Take 1 tablet by mouth daily as needed (as needed for adhd).   BUPROPION  (WELLBUTRIN  XL) 150 MG 24 HR TABLET    Take 450 mg by mouth daily.   FLUOXETINE  (PROZAC ) 20 MG CAPSULE    Take 20 mg by mouth daily.   FLUOXETINE  (PROZAC ) 40 MG CAPSULE    Take 40 mg by mouth daily.   MUPIROCIN  OINTMENT (BACTROBAN ) 2 %    Apply 1 Application topically 2 (two) times daily. To affected facial area   ONDANSETRON  (ZOFRAN -ODT) 4 MG DISINTEGRATING TABLET    Take 4 mg by mouth every 8 (eight) hours as needed.   PANTOPRAZOLE  (PROTONIX ) 20 MG TABLET    Take 1 tablet (20 mg total) by mouth daily.   TOPIRAMATE  (TOPAMAX ) 100 MG TABLET    Take 300 mg by mouth at bedtime.  Modified Medications   No  medications on file  Discontinued Medications   No medications on file    Subjective: Discussed the use of AI scribe software for clinical note transcription with the patient, who gave verbal consent to proceed.   48 year old female with prior history of ADHD, anxiety, depression, OCD, seizure, anemia, GERD who is referred from PCP for concern for MRSA infection.  Patient reports early May she developed a pustule on her face, leading to hospitalization from May 3rd to May 6th. She received vancomycin  and ceftriaxone  in the hospital and switched to doxycycline  and Augmentin  on discharge, which initially cleared the lesions, but they recurred on her face after stopping antibiotics.  In June, a persistent lesion developed on her nose, lasting three months. Recently, two lesions appeared in her genital area after shaving, suspected to be ingrown hairs. Despite a two-week course of doxycycline  completed four days ago, the lesions persist. Some lesions have lasted up to three months, causing significant discomfort, especially in the genital area. Adhesive bandages have been used for coverage.  She reports one of the lesions was cultured by Dermatology and confirmed MRSA. She denies any history of similar infections including armpits, groin. Denies diabetes, or hypertension, and typically has low blood pressure.   Her social history includes occasional [employer] use, with no smoking or alcohol use. She currently feels well with no fever, chills, nausea, vomiting,  or diarrhea. She is using mupirocin  in her nares and bleach bath but reports being inconsistent.   Interval events  Seen in the ED 9/10 for concerns for MRSA flare up/cellulitis and was discharged after dose of IV dalbavancin. She was then seen again in the ED on 11/6 for non healing wound at the proximal nose present for 5 weeks. She was taking PO doxycycline  at that time and followed by wound care who sent to ED for Xray/MRI. No intervention  per ENT and discharged after one dose of dalbavancin.   11/11 Accompanied by hand adopted son. Reports wound in nose started popping up at the nose following day after last clinic visit with me. Denies any trauma or insect bite. Also reports seeing plastics who wanted wound/infection to be treated first. Feels nauseous but denies fevers, chills, vomiting or drainage from the wound. She reports she has not been able to do hyperbaric oxygen with wound care due to not having diagnosis of osteomyelitis. No other concerns.   Review of Systems: all systems reviewed with pertinent positives and negatives as listed above   Past Medical History:  Diagnosis Date   Acute posterior anal fissure    ADHD (attention deficit hyperactivity disorder)    Anemia    Anorexia    history of eating disorder   Anxiety    Chronic idiopathic constipation    Depression    Epilepsy (HCC)    Family history of bladder cancer    Family history of breast cancer    Family history of ovarian cancer    GERD (gastroesophageal reflux disease)    IBS (irritable bowel syndrome)    Obsessive compulsive disorder    OCD (obsessive compulsive disorder)    Seizures (HCC) 05/17/2014   Past Surgical History:  Procedure Laterality Date   BREAST LUMPECTOMY WITH RADIOACTIVE SEED LOCALIZATION Right 10/05/2019   Procedure: RIGHT BREAST LUMPECTOMY WITH RADIOACTIVE SEED LOCALIZATION;  Surgeon: Vanderbilt Ned, MD;  Location: [facility];  Service: General;  Laterality: Right;   LIPOSUCTION TRUNK     TONSILLECTOMY      Social History   Tobacco Use   Smoking status: Never   Smokeless tobacco: Never  Substance Use Topics   Alcohol use: Yes    Alcohol/week: 1.0 standard drink of alcohol    Types: 1 Glasses of wine per week    Comment: occasional wine   Drug use: No    Family History  Problem Relation Age of Onset   Parkinsonism Mother 86   Hypertension Father    Stroke Father    Lymphoma Father 66   Colon  polyps Father    Colon polyps Brother        at least 3   Rheum arthritis Maternal Grandfather    Parkinsonism Maternal Grandfather    Diabetes Maternal Grandmother    Parkinsonism Paternal Grandmother    Autism spectrum disorder Daughter        18q12.2 del   Skin cancer Maternal Aunt        BCC and SCC   Breast cancer Paternal Aunt 30   Ovarian cancer Paternal Aunt        dx in her early 60s   Bladder Cancer Paternal Aunt        dx in her 77s   Drug abuse Paternal Uncle    Drug abuse Paternal Aunt    Colon cancer Neg Hx    Esophageal cancer Neg Hx    Stomach cancer Neg Hx  Liver disease Neg Hx    Pancreatic cancer Neg Hx     Allergies  Allergen Reactions   Ciprofloxacin Hives   Percocet [Oxycodone-Acetaminophen ] Nausea Only    Health Maintenance  Topic Date Due   Hepatitis C Screening  Never done   DTaP/Tdap/Td (1 - Tdap) Never done   Hepatitis B Vaccines 19-59 Average Risk (1 of 3 - 19+ 3-dose series) Never done   HPV VACCINES (1 - 3-dose SCDM series) Never done   Cervical Cancer Screening (HPV/Pap Cotest)  Never done   COVID-19 Vaccine (2 - Pfizer risk series) 05/02/2019   Colonoscopy  Never done   Influenza Vaccine  08/27/2023   Mammogram  12/16/2023   HIV Screening  Completed   Pneumococcal Vaccine  Aged Out   Meningococcal B Vaccine  Aged Out    Objective: BP 126/85   Pulse 62   Temp 97.6 F (36.4 C) (Oral)   Ht 6' 1 (1.854 m)   Wt 170 lb (77.1 kg)   SpO2 100%   BMI 22.43 kg/m   Physical Exam Constitutional:      Appearance: Normal appearance.  HENT:     Head: Normocephalic and atraumatic.      Mouth: Mucous membranes are moist.  Eyes:    Conjunctiva/sclera: Conjunctivae normal.     Pupils: Pupils are equal, round, and b/l symmetrical    Cardiovascular:     Rate and Rhythm: Normal rate and regular rhythm.     Heart sounds: S1S2  Pulmonary:     Effort: Pulmonary effort is normal.     Breath sounds: Normal breath sounds.   Abdominal:      General: Non distended     Palpations: soft, Non tender   Musculoskeletal:        General: Normal range of motion.   Skin:    General: Skin is warm and dry.     Comments: wound in the proximal nose, no signs of active cellulitis or drainage   Neurological:     General: grossly non focal     Mental Status: awake, alert and oriented to person, place, and time.   Psychiatric:        Mood and Affect: Mood normal.   Lab Results Lab Results  Component Value Date   WBC 11.2 (H) 12/01/2023   HGB 14.2 12/01/2023   HCT 41.4 12/01/2023   MCV 92.6 12/01/2023   PLT 236 12/01/2023    Lab Results  Component Value Date   CREATININE 1.03 (H) 12/01/2023   BUN 12 12/01/2023   NA 139 12/01/2023   K 3.4 (L) 12/01/2023   CL 106 12/01/2023   CO2 23 12/01/2023    Lab Results  Component Value Date   ALT 8 12/01/2023   AST 23 12/01/2023   ALKPHOS 66 12/01/2023   BILITOT 0.5 12/01/2023    No results found for: CHOL, HDL, LDLCALC, LDLDIRECT, TRIG, CHOLHDL Lab Results  Component Value Date   LABRPR NON REACTIVE 08/15/2009   No results found for: HIV1RNAQUANT, HIV1RNAVL, CD4TABS  Microbiology Results for orders placed or performed during the hospital encounter of 05/29/23  Blood culture (routine x 2)     Status: None   Collection Time: 05/29/23  5:36 AM   Specimen: BLOOD LEFT FOREARM  Result Value Ref Range Status   Specimen Description   Final    BLOOD LEFT FOREARM Performed at [facility], [address], [place], [place] [address]    Special Requests   Final  BOTTLES DRAWN AEROBIC AND ANAEROBIC Blood Culture adequate volume Performed at [facility], [address] [place], [place] [address]    Culture   Final    NO GROWTH 5 DAYS Performed at [facility], [address] [place], [place] [address]    Report Status 06/03/2023 FINAL  Final  Blood culture (routine x 2)     Status: None   Collection Time:  05/29/23  5:42 AM   Specimen: BLOOD  Result Value Ref Range Status   Specimen Description   Final    BLOOD LEFT ANTECUBITAL Performed at [facility], [address] [place], [place] [address]    Special Requests   Final    BOTTLES DRAWN AEROBIC AND ANAEROBIC Blood Culture adequate volume Performed at [facility], [address] [place], [place] [address]    Culture   Final    NO GROWTH 5 DAYS Performed at [facility], [address], [place], [place] [address]    Report Status 06/03/2023 FINAL  Final   Imaging  IR PICC PLACEMENT RIGHT >5 YRS INC IMG GUIDE Result Date: 12/10/2023 INDICATION: History of osteomyelitis of the nose. Request for PICC placement for prolonged IV antibiotics. EXAM: ULTRASOUND AND FLUOROSCOPIC GUIDED PICC LINE INSERTION MEDICATIONS: 2 mL 1% lidocaine  CONTRAST:  None FLUOROSCOPY TIME:  (0.8 mGy) COMPLICATIONS: None immediate. TECHNIQUE: The procedure, risks, benefits, and alternatives were explained to the patient and informed written consent was obtained. A timeout was performed prior to the initiation of the procedure. The right upper extremity was prepped with chlorhexidine  in a sterile fashion, and a sterile drape was applied covering the operative field. Maximum barrier sterile technique with sterile gowns and gloves were used for the procedure. A timeout was performed prior to the initiation of the procedure. Local anesthesia was provided with 1% lidocaine . Under direct ultrasound guidance, the basilic vein was accessed with a micropuncture kit after the overlying soft tissues were anesthetized with 1% lidocaine . Real-time ultrasound guidance was utilized for vascular access including the acquisition of a permanent ultrasound image documenting patency of the accessed vessel. A guidewire was advanced to the level of the superior caval-atrial junction for measurement purposes and the PICC line was cut to length. A  peel-away sheath was placed and a 38 cm, 5 French, single lumen was inserted to level of the superior caval-atrial junction. A post procedure spot fluoroscopic was obtained. The catheter easily aspirated and flushed and was secured in place with stat lock device. A dressing was applied. The patient tolerated the procedure well without immediate post procedural complication. FINDINGS: After catheter placement, the tip lies within the superior cavoatrial junction. The catheter aspirates and flushes normally and is ready for immediate use. IMPRESSION: Successful ultrasound and fluoroscopic guided placement of a right basilic vein approach, 38 cm, 5 French, single lumen PICC with tip at the superior caval-atrial junction. The PICC line is ready for immediate use. Performed by: Wyatt Pommier, PA-C Electronically Signed   By: CHRISTELLA.  Shick M.D.   On: 12/10/2023 09:39   MR FACE/TRIGEMINAL WO/W CM Result Date: 12/02/2023 EXAM: MRI FACE WITHOUT AND WITH CONTRAST 12/01/2023 11:38:15 PM TECHNIQUE: Multiplanar, multisequence MRI of the face was performed without and with intravenous contrast. COMPARISON: None available CLINICAL HISTORY: Non heeling wound to proximal nose, rule out nasal osteomyelitis. FINDINGS: AERODIGESTIVE TRACT: No mass or edema. SALIVARY GLANDS: Unremarkable. LYMPH NODES: No suspicious lymphadenopathy. SOFT TISSUES: Approximately 8 x 8 mm area of signal abnormality (T1  isointense and mildly T1 hyperintense) and enhancement involving the bridge of the nose and the distal tips of the left greater than right nasal bones (for example, see series 6, image 17). ORBITS: No acute abnormality or mass. SINUSES AND MASTOIDS: Inferior right maxillary sinus retention cyst. Otherwise, clear. BRAIN: No acute abnormality. IMPRESSION: 1. Approximately 8 x 8 mm area of signal abnormality and enhancement involving the bridge of the nose and the distal tips of the left greater than right nasal bones, nonspecific but most likely  infectious given the reported clinical history. Electronically signed by: Gilmore Molt MD 12/02/2023 12:04 AM EST RP Workstation: [id number]   DG Nasal Bones Result Date: 12/01/2023 CLINICAL DATA:  Nonhealing wound on nasal area EXAM: NASAL BONES - 3+ VIEW COMPARISON:  None Available. FINDINGS: No fracture or malalignment. Skin ulcer or wound at the superficial soft tissues of the upper nasal area. IMPRESSION: Skin ulcer or wound at the superficial soft tissues of the upper nasal area. No acute osseous abnormality. Electronically Signed   By: Luke Bun M.D.   On: 12/01/2023 20:48    Assessment/Plan # h/o Facial cellulitis/abscess, MRSA related   # Nasal osteomyelitis/chronic nasal wound   - Place PICC. Scheduled for 11/14 - Start daptomycin, ceftriaxone  and metronidazole. See OPAT. Received IV dalbavancin on 11/6 - Stop doxycycline  - Labs today  - fu in 2-3 weeks - fu with wound care   I spent 40 minutes involved in face-to-face and non-face-to-face activities for this patient on the day of the visit. Professional time spent includes the following activities: Preparing to see the patient (review of tests), Obtaining and reviewing separately obtained history (multiple ED visits since last clinic visit, last wound care note), Performing a medically appropriate examination and evaluation,  Documenting clinical information in the EMR, Independently interpreting results (not separately reported), Communicating results to the patient/husband, Counseling and educating the patient/husband and Care coordination (not separately reported).   Of note, portions of this note may have been created with voice recognition software. While this note has been edited for accuracy, occasional wrong-word or 'sound-a-like' substitutions may have occurred due to the inherent limitations of voice recognition software.   Annalee Joseph, MD [facility] 12/07/2023, 11:37 AM

## 2023-12-07 NOTE — Telephone Encounter (Signed)
 New OPAT orders per Dr. Dea, orders shared with Holley Herring, RN at [facility] pharmacy staff.   IR appointment: 11/14 @ 9am at [facility] - appointment time and location provided to both patient and [facility].   First dose: short stay - Advanced aware and orders faxed to short stay

## 2023-12-08 LAB — COMPREHENSIVE METABOLIC PANEL WITH GFR
AG Ratio: 1.6 (calc) (ref 1.0–2.5)
ALT: 16 U/L (ref 6–29)
AST: 26 U/L (ref 10–35)
Albumin: 4.1 g/dL (ref 3.6–5.1)
Alkaline phosphatase (APISO): 48 U/L (ref 31–125)
BUN: 11 mg/dL (ref 7–25)
CO2: 29 mmol/L (ref 20–32)
Calcium: 8.7 mg/dL (ref 8.6–10.2)
Chloride: 105 mmol/L (ref 98–110)
Creat: 0.88 mg/dL (ref 0.50–0.99)
Globulin: 2.5 g/dL (ref 1.9–3.7)
Glucose, Bld: 94 mg/dL (ref 65–99)
Potassium: 3.8 mmol/L (ref 3.5–5.3)
Sodium: 140 mmol/L (ref 135–146)
Total Bilirubin: 0.5 mg/dL (ref 0.2–1.2)
Total Protein: 6.6 g/dL (ref 6.1–8.1)
eGFR: 83 mL/min/1.73m2 (ref >=60)

## 2023-12-08 LAB — SEDIMENTATION RATE: Sed Rate: 2 mm/h (ref 0–20)

## 2023-12-08 LAB — CBC
HCT: 40.2 % (ref 35.0–45.0)
Hemoglobin: 13.1 g/dL (ref 11.7–15.5)
MCH: 31.6 pg (ref 27.0–33.0)
MCHC: 32.6 g/dL (ref 32.0–36.0)
MCV: 96.9 fL (ref 80.0–100.0)
MPV: 9.6 fL (ref 7.5–12.5)
Platelets: 238 Thousand/uL (ref 140–400)
RBC: 4.15 Million/uL (ref 3.80–5.10)
RDW: 12.5 % (ref 11.0–15.0)
WBC: 6.1 Thousand/uL (ref 3.8–10.8)

## 2023-12-08 LAB — CK: Total CK: 120 U/L (ref 20–239)

## 2023-12-08 LAB — C-REACTIVE PROTEIN: CRP: <3 mg/L (ref <8.0)

## 2023-12-10 ENCOUNTER — Ambulatory Visit (HOSPITAL_COMMUNITY)
Admission: RE | Admit: 2023-12-10 | Discharge: 2023-12-10 | Disposition: A | Discharge status: Home / Self Care | Source: Ambulatory Visit | Attending: Infectious Diseases | Admitting: Infectious Diseases

## 2023-12-10 ENCOUNTER — Other Ambulatory Visit: Payer: Self-pay | Admitting: Infectious Diseases

## 2023-12-10 DIAGNOSIS — M869 Osteomyelitis, unspecified: Secondary | ICD-10-CM

## 2023-12-10 DIAGNOSIS — Z79899 Other long term (current) drug therapy: Secondary | ICD-10-CM

## 2023-12-10 DIAGNOSIS — Z452 Encounter for adjustment and management of vascular access device: Secondary | ICD-10-CM

## 2023-12-10 MED ORDER — HEPARIN SOD (PORK) LOCK FLUSH 100 UNIT/ML IV SOLN
500.0000 [IU] | Freq: Once | INTRAVENOUS | Status: AC
  Administered 2023-12-10: 500 [IU] via INTRAVENOUS

## 2023-12-10 MED ORDER — LIDOCAINE-EPINEPHRINE 1 %-1:100000 IJ SOLN
20.0000 mL | Freq: Once | INTRAMUSCULAR | Status: AC
  Administered 2023-12-10: 10 mL

## 2023-12-10 MED ORDER — LIDOCAINE-EPINEPHRINE 1 %-1:100000 IJ SOLN
INTRAMUSCULAR | Status: AC
  Filled 2023-12-10: qty 1

## 2023-12-10 MED ORDER — HEPARIN SOD (PORK) LOCK FLUSH 100 UNIT/ML IV SOLN
INTRAVENOUS | Status: AC
  Filled 2023-12-10: qty 5

## 2023-12-11 ENCOUNTER — Encounter: Payer: Self-pay | Admitting: Infectious Diseases

## 2023-12-13 ENCOUNTER — Ambulatory Visit: Payer: Self-pay | Admitting: Infectious Diseases

## 2023-12-13 NOTE — Addendum Note (Signed)
 Addended by: DEA SHINER on: 12/13/2023 02:16 PM   Modules accepted: Level of Service

## 2023-12-17 ENCOUNTER — Telehealth: Payer: Self-pay | Admitting: Internal Medicine

## 2023-12-17 ENCOUNTER — Ambulatory Visit (HOSPITAL_COMMUNITY)
Admission: RE | Admit: 2023-12-17 | Discharge: 2023-12-17 | Disposition: A | Discharge status: Home / Self Care | Source: Ambulatory Visit | Attending: Infectious Diseases | Admitting: Infectious Diseases

## 2023-12-17 ENCOUNTER — Telehealth: Payer: Self-pay

## 2023-12-17 ENCOUNTER — Telehealth: Payer: Self-pay | Admitting: Infectious Diseases

## 2023-12-17 DIAGNOSIS — T829XXA Unspecified complication of cardiac and vascular prosthetic device, implant and graft, initial encounter: Secondary | ICD-10-CM

## 2023-12-17 NOTE — Telephone Encounter (Signed)
 Goodmorning Dr.Pyrtle,  Patients husband calling stating they are going to have to cancel double procedure on 12/21/23 due to wife having a mrsa flare up and needed to treat it before having a procedure done. Patient will call back to reschedule once she's better.  Please advise  Thank you

## 2023-12-17 NOTE — Addendum Note (Signed)
 Addended by: FLORENE BOUCHARD D on: 12/17/2023 12:01 PM   Modules accepted: Orders

## 2023-12-17 NOTE — Telephone Encounter (Signed)
 Spoke with Julia [name], notified her that [facility] can see her today. She confirms she has an appointment with them at 2 PM.   Ivo Moga, BSN, RN

## 2023-12-17 NOTE — Telephone Encounter (Signed)
 Addressed in additional encounter.   [name], BSN, RN

## 2023-12-17 NOTE — Telephone Encounter (Signed)
 [facility] IR unable to see patient today. [facility] can fit her in this afternoon and will call patient to arrange.   Domnique Vanegas, BSN, RN

## 2023-12-17 NOTE — Telephone Encounter (Signed)
 Julia [name] requesting to speak with a nurse. She stated she recently had a PICC line put in and is experiencing discomfort. She has complaints of blood, pain, bruising down her arm, but no puss. Julia [name] can be reached at [phone].

## 2023-12-17 NOTE — Telephone Encounter (Signed)
 Patient and her husband called, report patient had PICC line placed last week. She has constant pain in her arm with bruising that is spreading. Denies any drainage from the insertion site. She has been wearing a compression sock over the site. Does report some redness in the arm, but denies swelling.   Per Dr. Dea, place order for IR evaluation. Message sent to IR team as well.   Advised patient that if arm develops swelling or warmth or if she has sudden shortness of breath or chest pain to present to the ED. Patient verbalized understanding and has no further questions.    Darrly Loberg, BSN, RN

## 2023-12-21 ENCOUNTER — Encounter: Admitting: Internal Medicine

## 2023-12-24 ENCOUNTER — Encounter: Payer: Self-pay | Admitting: Infectious Diseases

## 2023-12-27 ENCOUNTER — Other Ambulatory Visit: Payer: Self-pay | Admitting: Infectious Diseases

## 2023-12-27 MED ORDER — ONDANSETRON 4 MG PO TBDP: 4.0000 mg | ORAL_TABLET | Freq: Three times a day (TID) | ORAL | 0 refills | Status: DC | PRN

## 2023-12-27 MED ORDER — ONDANSETRON 4 MG PO TBDP: 4.0000 mg | ORAL_TABLET | Freq: Three times a day (TID) | ORAL | 0 refills | Status: AC | PRN

## 2024-01-06 ENCOUNTER — Other Ambulatory Visit (HOSPITAL_COMMUNITY): Payer: Self-pay

## 2024-01-06 ENCOUNTER — Telehealth: Payer: Self-pay

## 2024-01-06 NOTE — Telephone Encounter (Signed)
 Pharmacy Patient Advocate Encounter  Received notification from [facility] that Prior Authorization for Pantoprazole  Sodium 20MG  dr tablets has been APPROVED from 01-06-2024 to 01-05-2027. Ran test claim, Copay is $0.00 for 90 tablets per 90 days. This test claim was processed through [facility]- copay amounts may vary at other pharmacies due to pharmacy/plan contracts, or as the patient moves through the different stages of their insurance plan.   PA #/Case ID/Reference #: [id number]

## 2024-01-06 NOTE — Telephone Encounter (Signed)
 Pharmacy Patient Advocate Encounter   Received notification from CoverMyMeds that prior authorization for Pantoprazole  Sodium 20MG  dr tablets is required/requested.   Insurance verification completed.   The patient is insured through [facility].   Per test claim: PA required; PA submitted to above mentioned insurance via Latent Key/confirmation #/EOC [id number] Status is pending

## 2024-01-08 ENCOUNTER — Encounter: Payer: Self-pay | Admitting: Infectious Diseases

## 2024-01-10 ENCOUNTER — Other Ambulatory Visit: Payer: Self-pay | Admitting: Infectious Diseases

## 2024-01-10 DIAGNOSIS — Z452 Encounter for adjustment and management of vascular access device: Secondary | ICD-10-CM

## 2024-01-10 NOTE — Telephone Encounter (Signed)
 Called patient, her husband [name]) answered. He states Julia [name] is experiencing new shortness of breath and difficulty breathing that started this weekend. Advised that Julia [name] should go to the ED to rule out potential complications from PICC line. He verbalized understanding.   Kit Mollett, BSN, RN

## 2024-01-11 ENCOUNTER — Emergency Department (HOSPITAL_COMMUNITY)

## 2024-01-11 ENCOUNTER — Emergency Department (HOSPITAL_COMMUNITY)
Admission: EM | Admit: 2024-01-11 | Discharge: 2024-01-11 | Discharge status: Against Medical Advice | Source: Ambulatory Visit | Attending: Emergency Medicine | Admitting: Emergency Medicine

## 2024-01-11 ENCOUNTER — Other Ambulatory Visit: Payer: Self-pay

## 2024-01-11 DIAGNOSIS — Z5329 Procedure and treatment not carried out because of patient's decision for other reasons: Secondary | ICD-10-CM | POA: Insufficient documentation

## 2024-01-11 DIAGNOSIS — Z95828 Presence of other vascular implants and grafts: Secondary | ICD-10-CM | POA: Insufficient documentation

## 2024-01-11 DIAGNOSIS — R0600 Dyspnea, unspecified: Secondary | ICD-10-CM

## 2024-01-11 DIAGNOSIS — Z91148 Patient's other noncompliance with medication regimen for other reason: Secondary | ICD-10-CM | POA: Insufficient documentation

## 2024-01-11 DIAGNOSIS — R079 Chest pain, unspecified: Secondary | ICD-10-CM | POA: Insufficient documentation

## 2024-01-11 LAB — CBC WITH DIFFERENTIAL/PLATELET
Abs Immature Granulocytes: 0.02 K/uL (ref 0.00–0.07)
Basophils Absolute: 0 K/uL (ref 0.0–0.1)
Basophils Relative: 0 %
Eosinophils Absolute: 0.2 K/uL (ref 0.0–0.5)
Eosinophils Relative: 3 %
HCT: 39 % (ref 36.0–46.0)
Hemoglobin: 13.2 g/dL (ref 12.0–15.0)
Immature Granulocytes: 0 %
Lymphocytes Relative: 35 %
Lymphs Abs: 2.3 K/uL (ref 0.7–4.0)
MCH: 32 pg (ref 26.0–34.0)
MCHC: 33.8 g/dL (ref 30.0–36.0)
MCV: 94.7 fL (ref 80.0–100.0)
Monocytes Absolute: 0.4 K/uL (ref 0.1–1.0)
Monocytes Relative: 7 %
Neutro Abs: 3.6 K/uL (ref 1.7–7.7)
Neutrophils Relative %: 55 %
Platelets: 216 K/uL (ref 150–400)
RBC: 4.12 MIL/uL (ref 3.87–5.11)
RDW: 12.4 % (ref 11.5–15.5)
WBC: 6.6 K/uL (ref 4.0–10.5)
nRBC: 0 % (ref 0.0–0.2)

## 2024-01-11 LAB — COMPREHENSIVE METABOLIC PANEL WITH GFR
ALT: 11 U/L (ref 0–44)
AST: 27 U/L (ref 15–41)
Albumin: 4.2 g/dL (ref 3.5–5.0)
Alkaline Phosphatase: 56 U/L (ref 38–126)
Anion gap: 8 (ref 5–15)
BUN: 11 mg/dL (ref 6–20)
CO2: 23 mmol/L (ref 22–32)
Calcium: 8.8 mg/dL — ABNORMAL LOW (ref 8.9–10.3)
Chloride: 109 mmol/L (ref 98–111)
Creatinine, Ser: 0.71 mg/dL (ref 0.44–1.00)
GFR, Estimated: >60 mL/min (ref >60)
Glucose, Bld: 91 mg/dL (ref 70–99)
Potassium: 4.1 mmol/L (ref 3.5–5.1)
Sodium: 139 mmol/L (ref 135–145)
Total Bilirubin: 0.4 mg/dL (ref 0.0–1.2)
Total Protein: 6.7 g/dL (ref 6.5–8.1)

## 2024-01-11 LAB — TROPONIN T, HIGH SENSITIVITY: Troponin T High Sensitivity: <15 ng/L (ref 0–19)

## 2024-01-11 LAB — D-DIMER, QUANTITATIVE: D-Dimer, Quant: <0.27 ug{FEU}/mL (ref 0.00–0.50)

## 2024-01-11 NOTE — Progress Notes (Signed)
 IV Team requested to bedside for assessment of patient's PICC line. Pt with c/o discomfort in her chest, states she can feel it moving around, in and out. Pt reports this has happened before with this PICC line and the external adjustment of the line helped. Missed last dressing change while out of town. Pt reports she changed the top part of her dressing due to it falling off. Tegaderm over insertion site, securing device in place. Paper tape along edges.    Cap changed, flushed with GBR.  Dressing changed per standard sterile technique.  Site with no redness, inflammation or drainage.  Discussed with patient that site looks healthy, line flushes well with ease and CXR is reassuring. Pt with improvement of symptoms following dressing change. Discussed with primary RN at nurse's station.

## 2024-01-11 NOTE — ED Notes (Signed)
 IV team at [facility], sterile procedure in progress

## 2024-01-11 NOTE — ED Provider Notes (Signed)
 [place] EMERGENCY DEPARTMENT AT [facility] Provider Note   CSN: [id number] Arrival date & time: 01/11/24  9097     History  Chief Complaint  Patient presents with   Vascular Access Problem    Julia [name] is a 48 y.o. female with PMH as listed below who presents reporting that her PICC line is dislodged. PT states that she has a pressure feeling in the RIGHT side of her chest, which she states happened the last time it was dislodged. States that they just adjusted the dressing and maybe pushed the PICC line in a little bit the last time this happened and it improved. Doesn't feel SOB but feels like there is something inside the right side of her chest, feels a pressure, and it is worse w/ deep breath. No leg swelling, h/o DVT/PE. No flu-like sxs, f/c, or cough. No N/V, abdominal pain. 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 Reports she hasn't had abx in 4 days d/t this issue.  Per infectious disease note on 12/07/23, she had been given IV dalbavacin and was to start daptomycin, ceftriaxone  and metronidazole . Note mentions f/u in 3 weeks but doesn't seem as though patient has followed up yet. Per chart review she had reported shortness of breath and difficulty breathing.    Past Medical History:  Diagnosis Date   Acute posterior anal fissure    ADHD (attention deficit hyperactivity disorder)    Anemia    Anorexia    history of eating disorder   Anxiety    Chronic idiopathic constipation    Depression    Epilepsy (HCC)    Family history of bladder cancer    Family history of breast cancer    Family history of ovarian cancer    GERD (gastroesophageal reflux disease)    IBS (irritable bowel syndrome)    Obsessive compulsive disorder    OCD (obsessive compulsive disorder)    Seizures (HCC) 05/17/2014       Home Medications Prior to Admission medications  Medication Sig Start Date End Date Taking? Authorizing Provider  ALPRAZolam (XANAX) 1 MG tablet  Take 1 mg by mouth daily as needed for anxiety. For anxiety    [provider]  amphetamine -dextroamphetamine  (ADDERALL) 30 MG tablet Take 1 tablet by mouth daily as needed (as needed for adhd). 06/01/23   Cheryle Page, MD  buPROPion  (WELLBUTRIN  XL) 150 MG 24 hr tablet Take 450 mg by mouth daily. 01/07/17   [provider]  FLUoxetine  (PROZAC ) 20 MG capsule Take 20 mg by mouth daily. 10/24/20   [provider]  FLUoxetine  (PROZAC ) 40 MG capsule Take 40 mg by mouth daily.    [provider]  metroNIDAZOLE  (FLAGYL ) 500 MG tablet Take 1 tablet (500 mg total) by mouth 2 (two) times daily. 12/07/23   Manandhar, Sabina, MD  mupirocin  ointment (BACTROBAN ) 2 % Apply 1 Application topically 2 (two) times daily. To affected facial area 06/01/23   Cheryle Page, MD  ondansetron  (ZOFRAN -ODT) 4 MG disintegrating tablet Take 1 tablet (4 mg total) by mouth every 8 (eight) hours as needed for nausea or vomiting. 12/27/23   Manandhar, Sabina, MD  pantoprazole  (PROTONIX ) 20 MG tablet Take 1 tablet (20 mg total) by mouth daily. 10/27/23   [place], PA-C  topiramate  (TOPAMAX ) 100 MG tablet Take 300 mg by mouth at bedtime. 11/28/15   [provider]      Allergies    Ciprofloxacin and Percocet [oxycodone-acetaminophen ]  Review of Systems   Review of Systems A 10 point review of systems was performed and is negative unless otherwise reported in HPI.  Physical Exam Updated Vital Signs BP (!) 116/92 (BP Location: Left Arm)   Pulse 83   Temp 97.6 F (36.4 C) (Oral)   Resp 16   Ht 6' 1 (1.854 m)   Wt 73 kg   SpO2 100%   BMI 21.24 kg/m  Physical Exam General: Normal appearing female, lying in bed.  HEENT: PERRLA, Sclera anicteric, MMM, trachea midline.  Cardiology: RRR, no murmurs/rubs/gallops.  Resp: Normal respiratory rate and effort. CTAB, no wheezes, rhonchi, crackles.  Abd: Soft, non-tender, non-distended. No rebound tenderness or guarding.  GU:  Deferred. MSK: PICC line present in R bicep region without any surrounding erythema/induration/fluctuance. Dressing is partially coming off. Mild TTP around site. No peripheral edema or signs of trauma. Extremities without deformity or TTP. NVI. Skin: warm, dry.  Neuro: A&Ox4, CNs II-XII grossly intact. MAEs. Sensation grossly intact.  Psych: Normal mood and affect.   ED Results / Procedures / Treatments   Labs (all labs ordered are listed, but only abnormal results are displayed) Labs Reviewed  CBC WITH DIFFERENTIAL/PLATELET  COMPREHENSIVE METABOLIC PANEL WITH GFR  D-DIMER, QUANTITATIVE  TROPONIN T, HIGH SENSITIVITY    EKG None  Radiology DG Chest 2 View Result Date: 01/11/2024 CLINICAL DATA:  PICC line placement. EXAM: CHEST - 2 VIEW COMPARISON:  05/21/2020 FINDINGS: Right-sided PICC line has tip over the SVC. Lungs are adequately inflated and otherwise clear. Cardiomediastinal silhouette and remainder of the exam is unchanged. IMPRESSION: 1. No active cardiopulmonary disease. 2. Right-sided PICC line with tip over the SVC. Electronically Signed   By: Toribio Agreste M.D.   On: 01/11/2024 09:50    Procedures Procedures    Medications Ordered in ED Medications - No data to display  ED Course/ Medical Decision Making/ A&P                          Medical Decision Making Amount and/or Complexity of Data Reviewed Labs: ordered. Radiology: ordered. Decision-making details documented in ED Course.    This patient presents to the ED for concern of R sided chest discomfort, dyspnea, and c/f PICC line malpositioning, this involves an extensive number of treatment options, and is a complaint that carries with it a high risk of complications and morbidity.  I considered the following differential and admission for this acute, potentially life threatening condition.   MDM:    DDX for chest pain includes but is not limited to:  ACS/arrhythmia,  PE, pericarditis/myocarditis,  GERD/PUD/gastritis, or musculoskeletal pain. No abdominal pain and no c/f biliary disease. Consider clotted PICC line, PE. Dimer pending, will likely need CT PE. Consider ACS though less likely, EKG no signs of ischemia, trop pending. Lower c/f aortic dissection though vascular abnormality possible d/t line. Patient states she needs to pick up her children at 1 pm. Afebrile, non-toxic appearing, doesn't appear septic, doubt endocarditis. Hasn't had her IV abx in at least 4 days, will need to restart. Didn't f/u with infectious disease and states she has them scheduled this week.   Clinical Course as of 01/11/24 1304  Tue Jan 11, 2024  1010 DG Chest 2 View 1. No active cardiopulmonary disease. 2. Right-sided PICC line with tip over the SVC.   [HN]  1225 Have been trying for >1 hour to get ahold of IV team or PICC nurse. PICC nurse  now on the way to eval. [HN]  1302 Patient's labs in process including CBC, CMP, dimer, and troponin. The IV team came and evaluated the PICC line which is functional.  Chest x-ray shows it is in the correct place. Patient has not had her IV antibiotics in at least 4 days and I encouraged her to restart them at home.  She states she is still getting shipments.  She states she is going to follow-up with infectious disease this week.  Patient states that she does still feel chest discomfort on the right side and feels some dyspnea.  It does not seem to be coming from the PICC line.  I discussed with the patient that it is possible she is having some other cardiac or pulmonary abnormalities such as PE.  Patient reports that she has to pick up her children now and that her son has a pediatrician appointment at 145 that they cannot miss.  Patient states she will need to leave AGAINST MEDICAL ADVICE but can return to the emergency department later.  I discussed with the patient and the risks of leaving including worsening of her condition up to including death.  Patient reports  understanding of these risks.  She demonstrates capacity.  Patient will leave AGAINST MEDICAL ADVICE to return to the ER later. [HN]    Clinical Course User Index [HN] Franklyn Sid SAILOR, MD    Labs: I Ordered, and personally interpreted labs.  The pertinent results include:  those listed above  Imaging Studies ordered: I ordered imaging studies including CXR I independently visualized and interpreted imaging. I agree with the radiologist interpretation  Additional history obtained from chart review  Reevaluation: After the interventions noted above, I reevaluated the patient and found that they have :stayed the same  Social Determinants of Health: Lives independently  Disposition:  AMA  Co morbidities that complicate the patient evaluation  Past Medical History:  Diagnosis Date   Acute posterior anal fissure    ADHD (attention deficit hyperactivity disorder)    Anemia    Anorexia    history of eating disorder   Anxiety    Chronic idiopathic constipation    Depression    Epilepsy (HCC)    Family history of bladder cancer    Family history of breast cancer    Family history of ovarian cancer    GERD (gastroesophageal reflux disease)    IBS (irritable bowel syndrome)    Obsessive compulsive disorder    OCD (obsessive compulsive disorder)    Seizures (HCC) 05/17/2014     Medicines No orders of the defined types were placed in this encounter.   I have reviewed the patients home medicines and have made adjustments as needed  Problem List / ED Course: Problem List Items Addressed This Visit   None Visit Diagnoses       Right-sided chest pain    -  Primary     Dyspnea, unspecified type         S/P PICC central line placement         Noncompliance with medications                       This note was created using dictation software, which may contain spelling or grammatical errors.    Franklyn Sid SAILOR, MD 01/11/24 [phone]

## 2024-01-11 NOTE — Telephone Encounter (Signed)
 IR staff Kayleen Kendall) spoke with patient and advised her to go to ED. Per IR staff, patient stated she was going to go home and speak with her husband. Dr. Dea aware.   Appears patient is checked into [facility] ED.   Michael Walrath, BSN, RN

## 2024-01-11 NOTE — ED Notes (Signed)
 Pt leaving AMA. Leaving after speaking with EDP. Verbalizes has to go get/ pick up her kids, will return and/ or f/u with my PCP. Alert, NAD, calm, interactive, resps e/u, frequent sighs, skin W&D, speaking in clear complete sentences. Denies questions or needs. Declines further testing or care.

## 2024-01-11 NOTE — ED Notes (Signed)
EDP speaking with pt at this time

## 2024-01-11 NOTE — ED Notes (Signed)
 Blood sent to lab. Pt verbalizing wants to leave. Steady gait. Up to b/r. EDP to see/ speak with. Endorses chest tightness, 5/10, states, but it's fine. Denies sob, nausea or other sx.

## 2024-01-11 NOTE — Telephone Encounter (Signed)
 Notified by [facility] IR that patient presented this morning for an appointment. Patient's husband was advised yesterday that Angle would need to go to the ED for new onset shortness of breath instead of outpatient IR management.  Attempted to call Kynleigh this morning to clarify, no answer. Left voicemail stating Dr. Dea would like for her to go to the ED to evaluate for new onset shortness of breath and difficulty breathing and to call with any questions.   Elma Shands, BSN, RN

## 2024-01-11 NOTE — ED Triage Notes (Signed)
 PT ambulatory to triage reporting that her PICC line is dislodged. PT states that she has a pressure feeling in the RIGHT side of her chest, which she states happened the last time it was dislodged.   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

## 2024-01-17 ENCOUNTER — Ambulatory Visit: Admitting: Infectious Diseases

## 2024-01-21 ENCOUNTER — Emergency Department (HOSPITAL_COMMUNITY)

## 2024-01-21 ENCOUNTER — Other Ambulatory Visit: Payer: Self-pay

## 2024-01-21 ENCOUNTER — Emergency Department (HOSPITAL_COMMUNITY)
Admission: EM | Admit: 2024-01-21 | Discharge: 2024-01-21 | Disposition: A | Discharge status: Home / Self Care | Attending: Emergency Medicine | Admitting: Emergency Medicine

## 2024-01-21 ENCOUNTER — Encounter (HOSPITAL_COMMUNITY): Payer: Self-pay

## 2024-01-21 DIAGNOSIS — Z95828 Presence of other vascular implants and grafts: Secondary | ICD-10-CM | POA: Diagnosis not present

## 2024-01-21 DIAGNOSIS — R0789 Other chest pain: Secondary | ICD-10-CM | POA: Diagnosis present

## 2024-01-21 NOTE — ED Notes (Signed)
IV Team at [facility].

## 2024-01-21 NOTE — ED Triage Notes (Signed)
 Pt has PICC line in right upper arm. Pt was supposed to have PICC line removed, but she missed the appt and now she is not able to be seen by provider until 1/13. Pt wants PICC line removed today.

## 2024-01-21 NOTE — Progress Notes (Signed)
 Consulted to assess pt PICC complained discomfort at the site maybe pinch as per pt. Old dressing removed with small scab at the site, site dry with no discharges noted. New dressing applied . Chest x ray done and PICC tip is in right location. Paramedic [employer] notified.

## 2024-01-21 NOTE — ED Provider Notes (Signed)
 " [place] EMERGENCY DEPARTMENT AT [facility] Provider Note   CSN: [id number] Arrival date & time: 01/21/24  9065     Patient presents with: Vascular Access Problem   Julia [name] is a 48 y.o. female.   HPI Patient is a 48 year old female with a past medical history significant for MRSA infections on her face presented emergency room today with complaints of persistent discomfort in her right upper extremity and in her right chest that has been ongoing for the past month or so intermittently.  She was seen in the ER 12/16 for this and had repositioning done by PICC nurse which immediately resolved her symptoms completely.  She states that they then reoccurred several days later.  It seems that these are somewhat positional symptoms she feels that she can tell where the PICC line tip is in her chest.  She had a D-dimer test on 12/16 which was negative/undetectable.      Prior to Admission medications  Medication Sig Start Date End Date Taking? Authorizing Provider  ALPRAZolam (XANAX) 1 MG tablet Take 1 mg by mouth daily as needed for anxiety. For anxiety    [provider]  amphetamine -dextroamphetamine  (ADDERALL) 30 MG tablet Take 1 tablet by mouth daily as needed (as needed for adhd). 06/01/23   Cheryle Page, MD  buPROPion  (WELLBUTRIN  XL) 150 MG 24 hr tablet Take 450 mg by mouth daily. 01/07/17   [provider]  FLUoxetine  (PROZAC ) 20 MG capsule Take 20 mg by mouth daily. 10/24/20   [provider]  FLUoxetine  (PROZAC ) 40 MG capsule Take 40 mg by mouth daily.    [provider]  metroNIDAZOLE  (FLAGYL ) 500 MG tablet Take 1 tablet (500 mg total) by mouth 2 (two) times daily. 12/07/23   Manandhar, Sabina, MD  mupirocin  ointment (BACTROBAN ) 2 % Apply 1 Application topically 2 (two) times daily. To affected facial area 06/01/23   Cheryle Page, MD  ondansetron  (ZOFRAN -ODT) 4 MG disintegrating tablet Take 1 tablet (4 mg total) by mouth every 8  (eight) hours as needed for nausea or vomiting. 12/27/23   Manandhar, Sabina, MD  pantoprazole  (PROTONIX ) 20 MG tablet Take 1 tablet (20 mg total) by mouth daily. 10/27/23   [place], PA-C  topiramate  (TOPAMAX ) 100 MG tablet Take 300 mg by mouth at bedtime. 11/28/15   [provider]    Allergies: Ciprofloxacin and Percocet [oxycodone-acetaminophen ]    Review of Systems  Updated Vital Signs BP 113/75 (BP Location: Left Arm)   Pulse 72   Temp 98.1 F (36.7 C) (Oral)   Resp 16   Ht 6' 1 (1.854 m)   Wt 73 kg   SpO2 100%   BMI 21.23 kg/m   Physical Exam Vitals and nursing note reviewed.  Constitutional:      General: She is not in acute distress. HENT:     Head: Normocephalic and atraumatic.     Nose: Nose normal.  Eyes:     General: No scleral icterus. Cardiovascular:     Rate and Rhythm: Normal rate and regular rhythm.     Pulses: Normal pulses.     Heart sounds: Normal heart sounds.  Pulmonary:     Effort: Pulmonary effort is normal. No respiratory distress.     Breath sounds: No wheezing.  Abdominal:     Palpations: Abdomen is soft.     Tenderness: There is no abdominal tenderness.  Musculoskeletal:     Cervical back: Normal range of motion.  Right lower leg: No edema.     Left lower leg: No edema.  Skin:    General: Skin is warm and dry.     Capillary Refill: Capillary refill takes less than 2 seconds.     Comments: PICC line in right upper extremity  Neurological:     Mental Status: She is alert. Mental status is at baseline.  Psychiatric:        Mood and Affect: Mood normal.        Behavior: Behavior normal.     (all labs ordered are listed, but only abnormal results are displayed) Labs Reviewed - No data to display  EKG: None  Radiology: No results found.   Procedures   Medications Ordered in the ED - No data to display                                  Medical Decision Making Amount and/or Complexity of Data  Reviewed Radiology: ordered.   Patient is a 48 year old female with a past medical history significant for MRSA infections on her face presented emergency room today with complaints of persistent discomfort in her right upper extremity and in her right chest that has been ongoing for the past month or so intermittently.  She was seen in the ER 12/16 for this and had repositioning done by PICC nurse which immediately resolved her symptoms completely.  She states that they then reoccurred several days later.  It seems that these are somewhat positional symptoms she feels that she can tell where the PICC line tip is in her chest.  She had a D-dimer test on 12/16 which was negative/undetectable.   Show decision-making oversedation with patient was had and she would prefer to see a PICC line nurse rather than do repeat workup that she had had previously.  I am reassured that her D-dimer was negative.  Vital signs normal well-appearing SpO2 100% on room air.  Discussed with PICC line nurse will obtain chest x-ray to evaluate for placement and evaluate patient at bedside.  Jannette B of picc line RN has made adjustments.  Patient feels much improved.  Will discharge home    Final diagnoses:  S/P PICC central line placement    ED Discharge Orders     None          Neldon Hamp RAMAN, [place] 01/21/24 1445    Ellouise Richerd POUR, DO 01/21/24 1512  "

## 2024-01-28 ENCOUNTER — Emergency Department (HOSPITAL_COMMUNITY)
Admission: EM | Admit: 2024-01-28 | Discharge: 2024-01-28 | Disposition: A | Discharge status: Home / Self Care | Source: Home / Self Care | Attending: Emergency Medicine | Admitting: Emergency Medicine

## 2024-01-28 ENCOUNTER — Encounter (HOSPITAL_COMMUNITY): Payer: Self-pay

## 2024-01-28 ENCOUNTER — Telehealth: Payer: Self-pay

## 2024-01-28 DIAGNOSIS — Z789 Other specified health status: Secondary | ICD-10-CM

## 2024-01-28 DIAGNOSIS — Z452 Encounter for adjustment and management of vascular access device: Secondary | ICD-10-CM | POA: Insufficient documentation

## 2024-01-28 DIAGNOSIS — Z95828 Presence of other vascular implants and grafts: Secondary | ICD-10-CM

## 2024-01-28 MED ORDER — ONDANSETRON 4 MG PO TBDP: 4.0000 mg | ORAL_TABLET | Freq: Once | ORAL | Status: DC

## 2024-01-28 MED ORDER — SODIUM CHLORIDE 0.9% FLUSH: 10.0000 mL | INTRAVENOUS | Status: DC | PRN

## 2024-01-28 NOTE — ED Provider Triage Note (Signed)
 Emergency Medicine Provider Triage Evaluation Note  Julia [name] , a 49 y.o. female  was evaluated in triage.  Pt complains of PICC line in right arm is coming out, is supposed to be in place through 02/08/23. Home health nurse does weekly checks but missed this week due to the holiday. Does daily infusions, last infusion was yesterday.   Review of Systems  Positive:  Negative:   Physical Exam  BP 126/83 (BP Location: Left Arm)   Pulse 80   Temp 97.9 F (36.6 C)   Resp 20   SpO2 100%  Gen:   Awake, no distress   Resp:  Normal effort  MSK:   Moves extremities without difficulty  Other:    Medical Decision Making  Medically screening exam initiated at 5:12 PM.  Appropriate orders placed.  Julia [name] was informed that the remainder of the evaluation will be completed by another provider, this initial triage assessment does not replace that evaluation, and the importance of remaining in the ED until their evaluation is complete.     Julia Leita LABOR, PA-C 01/28/24 1715

## 2024-01-28 NOTE — Telephone Encounter (Signed)
 Per Dr.Mananhdar extend IV abx until 02/08/2024. Message sent to [facility].  Haliey Romberg SHAUNNA Letters, CMA

## 2024-01-28 NOTE — ED Triage Notes (Signed)
 Pt presents because her PICC line is coming out R upper arm.  Sts she is getting antibiotics d/t MRSA.  Sts her home health RN was unable to come out and address the issue today.

## 2024-01-28 NOTE — ED Provider Notes (Signed)
 " [place] EMERGENCY DEPARTMENT AT [facility] Provider Note   CSN: [id number] Arrival date & time: 01/28/24  1609     Patient presents with: Vascular Access Problem   Julia [name] is a 49 y.o. female.   49 year old female presents with problem with her right upper extremity PICC line which has partially pulled out. Unable to have her infusion today. No complaints otherwise.        Prior to Admission medications  Medication Sig Start Date End Date Taking? Authorizing Provider  ALPRAZolam (XANAX) 1 MG tablet Take 1 mg by mouth daily as needed for anxiety. For anxiety    [provider]  amphetamine -dextroamphetamine  (ADDERALL) 30 MG tablet Take 1 tablet by mouth daily as needed (as needed for adhd). 06/01/23   Cheryle Page, MD  buPROPion  (WELLBUTRIN  XL) 150 MG 24 hr tablet Take 450 mg by mouth daily. 01/07/17   [provider]  FLUoxetine  (PROZAC ) 20 MG capsule Take 20 mg by mouth daily. 10/24/20   [provider]  FLUoxetine  (PROZAC ) 40 MG capsule Take 40 mg by mouth daily.    [provider]  metroNIDAZOLE  (FLAGYL ) 500 MG tablet Take 1 tablet (500 mg total) by mouth 2 (two) times daily. 12/07/23   Manandhar, Sabina, MD  mupirocin  ointment (BACTROBAN ) 2 % Apply 1 Application topically 2 (two) times daily. To affected facial area 06/01/23   Cheryle Page, MD  ondansetron  (ZOFRAN -ODT) 4 MG disintegrating tablet Take 1 tablet (4 mg total) by mouth every 8 (eight) hours as needed for nausea or vomiting. 12/27/23   Manandhar, Sabina, MD  pantoprazole  (PROTONIX ) 20 MG tablet Take 1 tablet (20 mg total) by mouth daily. 10/27/23   [place], PA-C  topiramate  (TOPAMAX ) 100 MG tablet Take 300 mg by mouth at bedtime. 11/28/15   [provider]    Allergies: Ciprofloxacin and Percocet [oxycodone-acetaminophen ]    Review of Systems Negative except as per HPI Updated Vital Signs BP (!) 139/101 (BP Location: Left Leg)   Pulse 72   Temp  98.4 F (36.9 C) (Oral)   Resp 18   SpO2 98%   Physical Exam Vitals and nursing note reviewed.  Constitutional:      General: She is not in acute distress.    Appearance: She is well-developed. She is not diaphoretic.  HENT:     Head: Normocephalic and atraumatic.  Cardiovascular:     Pulses: Normal pulses.  Pulmonary:     Effort: Pulmonary effort is normal.  Musculoskeletal:     Comments: PICC in right upper arm, tapped in place and not well visualized.   Skin:    General: Skin is warm and dry.     Findings: No erythema or rash.  Neurological:     Mental Status: She is alert and oriented to person, place, and time.  Psychiatric:        Behavior: Behavior normal.     (all labs ordered are listed, but only abnormal results are displayed) Labs Reviewed - No data to display  EKG: None  Radiology: No results found.   Procedures   Medications Ordered in the ED  ondansetron  (ZOFRAN -ODT) disintegrating tablet 4 mg (4 mg Oral Patient Refused/Not Given 01/28/24 1835)  sodium chloride  flush (NS) 0.9 % injection 10-40 mL (has no administration in time range)  Medical Decision Making Risk Prescription drug management.   PICC displaced, call to IV team who reports line is out by about 5-6cm and will need to be removed. Recommends midline access due to need for IV abx for 11 more days. Order placed for PICC removal and midline access.  Patient tolerated well, ready for dc, will continue with home health and home infusions.      Final diagnoses:  S/P PICC central line placement  Problem with vascular access    ED Discharge Orders     None          Beverley Leita DELENA DEVONNA 01/28/24 2055    Francesca Elsie CROME, MD 01/28/24 2330  "

## 2024-01-28 NOTE — Progress Notes (Signed)
 A midline has been ordered for this patient. Patient's RUA PICC line out 5cm and removed per MD order. Patient refused PICC replacement. LUA midline inserted as ordered.  The [facility] RN has reviewed the patient's medical record including any arm restrictions, current creatinine clearance, length IV therapy is needed, and infusions needed/ordered to determine if a midline is the appropriate line for this individual patient. If there are contraindications, the physician and primary RN has been contacted by [facility] RN for further discussion. Midline Education: a midline is a long peripheral IV placed in the upper arm with the tip located at or near the axilla and distal to the shoulder should not be used as a CLABSI preventative measure   it has one lumen only it can remain in place for up to 29 days  Safe for Vancomycin  infusion LESS THAN 6 days Is safe for power injection if good blood return can be obtained and line flushes easily it CANNOT be used for continuous infusion of vesicants. Including TPN and chemotherapy Should NOT be placed for sole intent of obtaining labs as there is no guarantee blood can be successfully drawn from line  contraindicated in patients with thrombosis, hypercoagulability, decreased venous flow to the extremities, ESRD (without a nephrologist's approval), small vessels, allergy to polyurethane, or known/suspected presence of a device-related infection, bacteremia, or septicemia

## 2024-02-08 ENCOUNTER — Encounter: Payer: Self-pay | Admitting: Infectious Diseases

## 2024-02-08 ENCOUNTER — Telehealth: Payer: Self-pay

## 2024-02-08 ENCOUNTER — Other Ambulatory Visit: Payer: Self-pay

## 2024-02-08 ENCOUNTER — Ambulatory Visit: Admitting: Infectious Diseases

## 2024-02-08 VITALS — BP 130/87 | HR 73 | Temp 97.7°F | Wt 171.0 lb

## 2024-02-08 DIAGNOSIS — A4902 Methicillin resistant Staphylococcus aureus infection, unspecified site: Secondary | ICD-10-CM

## 2024-02-08 DIAGNOSIS — M869 Osteomyelitis, unspecified: Secondary | ICD-10-CM

## 2024-02-08 DIAGNOSIS — Z5181 Encounter for therapeutic drug level monitoring: Secondary | ICD-10-CM | POA: Diagnosis not present

## 2024-02-08 DIAGNOSIS — Z452 Encounter for adjustment and management of vascular access device: Secondary | ICD-10-CM | POA: Insufficient documentation

## 2024-02-08 MED ORDER — DOXYCYCLINE HYCLATE 100 MG PO TABS: 100.0000 mg | ORAL_TABLET | Freq: Two times a day (BID) | ORAL | 0 refills | Status: AC

## 2024-02-08 NOTE — Telephone Encounter (Signed)
 Per Dr. Dea mid line can be pulled today. Per pt Mid line came out; MD would like HH to confirm line is out. Was not checked during visit.  Lorenda CHRISTELLA Code, RMA

## 2024-02-08 NOTE — Progress Notes (Signed)
 "  Patient Active Problem List   Diagnosis Date Noted   Osteomyelitis (HCC) 12/07/2023   Needs peripherally inserted central catheter (PICC) 12/07/2023   Medication management 10/11/2023   MRSA infection 10/01/2023   Facial cellulitis 05/29/2023   Acne 11/07/2020   Cyst of ovary 11/07/2020   External hemorrhoids 11/07/2020   Genetic testing 09/19/2019   Family history of ovarian cancer    Family history of breast cancer    Family history of bladder cancer    Rectal pain 03/31/2017   Chronic constipation 03/31/2017   Gastroesophageal reflux disease 03/31/2017   Partial symptomatic epilepsy with complex partial seizures, not intractable, without status epilepticus (HCC) 06/20/2014   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 For anxiety   AMPHETAMINE -DEXTROAMPHETAMINE  (ADDERALL) 30 MG TABLET    Take 1 tablet by mouth daily as needed (as needed for adhd).   BUPROPION  (WELLBUTRIN  XL) 150 MG 24 HR TABLET    Take 450 mg by mouth daily.   FLUOXETINE  (PROZAC ) 20 MG CAPSULE    Take 20 mg by mouth daily.   FLUOXETINE  (PROZAC ) 40 MG CAPSULE    Take 40 mg by mouth daily.   METRONIDAZOLE  (FLAGYL ) 500 MG TABLET    Take 1 tablet (500 mg total) by mouth 2 (two) times daily.   MUPIROCIN  OINTMENT (BACTROBAN ) 2 %    Apply 1 Application topically 2 (two) times daily. To affected facial area   ONDANSETRON  (ZOFRAN -ODT) 4 MG DISINTEGRATING TABLET    Take 1 tablet (4 mg total) by mouth every 8 (eight) hours as needed for nausea or vomiting.   PANTOPRAZOLE  (PROTONIX ) 20 MG TABLET    Take 1 tablet  (20 mg total) by mouth daily.   TOPIRAMATE  (TOPAMAX ) 100 MG TABLET    Take 300 mg by mouth at bedtime.  Modified Medications   No medications on file  Discontinued Medications   No medications on file    Subjective: Discussed the use of AI scribe software for clinical note transcription with the patient, who gave verbal consent to proceed.   49 year old female with prior history of ADHD, anxiety, depression, OCD, seizure, anemia, GERD who is referred from PCP for concern for MRSA infection.  Patient reports early May she developed a pustule on her face, leading to hospitalization from May 3rd to May 6th. She received vancomycin  and ceftriaxone  in the hospital and switched to doxycycline  and Augmentin  on discharge, which initially cleared the lesions, but they recurred on her face after stopping antibiotics.  In June, a persistent lesion developed on her nose, lasting three months. Recently, two lesions appeared in her genital area after shaving, suspected to be ingrown hairs. Despite a two-week course of doxycycline  completed four days ago, the lesions persist. Some lesions have lasted up to three months, causing significant discomfort, especially in the genital area. Adhesive bandages have been used for coverage.  She reports one of the lesions was cultured by Dermatology and confirmed MRSA. She denies any history of similar infections including armpits, groin. Denies diabetes, or hypertension, and typically  has low blood pressure.   Her social history includes occasional [employer] use, with no smoking or alcohol use. She currently feels well with no fever, chills, nausea, vomiting, or diarrhea. She is using mupirocin  in her nares and bleach bath but reports being inconsistent.   Interval events  Seen in the ED 9/10 for concerns for MRSA flare up/cellulitis and was discharged after dose of IV dalbavancin. She was then seen again in the ED on 11/6 for non healing wound at the proximal nose  present for 5 weeks. She was taking PO doxycycline  at that time and followed by wound care who sent to ED for Xray/MRI. No intervention per ENT and discharged after one dose of dalbavancin.   11/11 Accompanied by hand adopted son. Reports wound in nose started popping up at the nose following day after last clinic visit with me. Denies any trauma or insect bite. Also reports seeing plastics who wanted wound/infection to be treated first. Feels nauseous but denies fevers, chills, vomiting or drainage from the wound. She reports she has not been able to do hyperbaric oxygen with wound care due to not having diagnosis of osteomyelitis. No other concerns  1/13 She had several  ED visit due to PICC issues but reports missing note more than 4-5 days of IV antibiotic. Completed IV antibiotics 2 weeks ago and midline fell off 2-3 days ago. Reports during Christmas time, nasal wound seemed to have healed but feels like its coming  back in the last 2-3 days with some redness. Denies fevers, chills, mild nausea, no vomiting. No other concerns.   Review of Systems: all systems reviewed with pertinent positives and negatives as listed above   Past Medical History:  Diagnosis Date   Acute posterior anal fissure    ADHD (attention deficit hyperactivity disorder)    Anemia    Anorexia    history of eating disorder   Anxiety    Chronic idiopathic constipation    Depression    Epilepsy (HCC)    Family history of bladder cancer    Family history of breast cancer    Family history of ovarian cancer    GERD (gastroesophageal reflux disease)    IBS (irritable bowel syndrome)    Obsessive compulsive disorder    OCD (obsessive compulsive disorder)    Seizures (HCC) 05/17/2014   Past Surgical History:  Procedure Laterality Date   BREAST LUMPECTOMY WITH RADIOACTIVE SEED LOCALIZATION Right 10/05/2019   Procedure: RIGHT BREAST LUMPECTOMY WITH RADIOACTIVE SEED LOCALIZATION;  Surgeon: Vanderbilt Ned, MD;   Location: [facility];  Service: General;  Laterality: Right;   LIPOSUCTION TRUNK     TONSILLECTOMY      Social History   Tobacco Use   Smoking status: Never   Smokeless tobacco: Never  Substance Use Topics   Alcohol use: Yes    Alcohol/week: 1.0 standard drink of alcohol    Types: 1 Glasses of wine per week    Comment: occasional wine   Drug use: No    Family History  Problem Relation Age of Onset   Parkinsonism Mother 54   Hypertension Father    Stroke Father    Lymphoma Father 61   Colon polyps Father    Colon polyps Brother        at least 3   Rheum arthritis Maternal Grandfather    Parkinsonism Maternal Grandfather    Diabetes Maternal Grandmother    Parkinsonism Paternal Grandmother    Autism spectrum disorder Daughter  18q12.2 del   Skin cancer Maternal Aunt        BCC and SCC   Breast cancer Paternal Aunt 28   Ovarian cancer Paternal Aunt        dx in her early 36s   Bladder Cancer Paternal Aunt        dx in her 71s   Drug abuse Paternal Uncle    Drug abuse Paternal Aunt    Colon cancer Neg Hx    Esophageal cancer Neg Hx    Stomach cancer Neg Hx    Liver disease Neg Hx    Pancreatic cancer Neg Hx     Allergies  Allergen Reactions   Ciprofloxacin Hives   Percocet [Oxycodone-Acetaminophen ] Nausea Only    Health Maintenance  Topic Date Due   COVID-19 Vaccine (1) Never done   Hepatitis C Screening  Never done   DTaP/Tdap/Td (1 - Tdap) Never done   Hepatitis B Vaccines 19-59 Average Risk (1 of 3 - 19+ 3-dose series) Never done   Cervical Cancer Screening (HPV/Pap Cotest)  Never done   Colonoscopy  Never done   Influenza Vaccine  Never done   Mammogram  12/16/2023   HIV Screening  Completed   Pneumococcal Vaccine  Aged Out   HPV VACCINES  Aged Out   Meningococcal B Vaccine  Aged Out    Objective: BP 130/87   Pulse 73   Temp 97.7 F (36.5 C) (Oral)   Wt 171 lb (77.6 kg)   SpO2 100%   BMI 22.56 kg/m   Physical  Exam Constitutional:      Appearance: Normal appearance.  HENT:     Head: Normocephalic and atraumatic.      Mouth: Mucous membranes are moist.  Eyes:    Conjunctiva/sclera: Conjunctivae normal.     Pupils: Pupils are equal, round, and b/l symmetrical    Cardiovascular:     Rate and Rhythm: Normal rate    Heart sounds  Pulmonary:     Effort: Pulmonary effort is normal.     Breath sounds:  Abdominal:     General: Non distended     Palpations:   Musculoskeletal:        General: ambulatory  Skin:    General: Skin is warm and dry.     Comments: proximal nasal area, slight redness, but no tenderness, discharge or  active cellulitis  Neurological:     General: grossly non focal     Mental Status: awake, alert   Psychiatric:        Mood and Affect: Mood normal.   Lab Results Lab Results  Component Value Date   WBC 6.6 01/11/2024   HGB 13.2 01/11/2024   HCT 39.0 01/11/2024   MCV 94.7 01/11/2024   PLT 216 01/11/2024    Lab Results  Component Value Date   CREATININE 0.71 01/11/2024   BUN 11 01/11/2024   NA 139 01/11/2024   K 4.1 01/11/2024   CL 109 01/11/2024   CO2 23 01/11/2024    Lab Results  Component Value Date   ALT 11 01/11/2024   AST 27 01/11/2024   ALKPHOS 56 01/11/2024   BILITOT 0.4 01/11/2024    No results found for: CHOL, HDL, LDLCALC, LDLDIRECT, TRIG, CHOLHDL Lab Results  Component Value Date   LABRPR NON REACTIVE 08/15/2009   No results found for: HIV1RNAQUANT, HIV1RNAVL, CD4TABS  Microbiology Results for orders placed or performed during the hospital encounter of 05/29/23  Blood culture (routine x 2)  Status: None   Collection Time: 05/29/23  5:36 AM   Specimen: BLOOD LEFT FOREARM  Result Value Ref Range Status   Specimen Description   Final    BLOOD LEFT FOREARM Performed at [facility], [address], [place], [place] [address]    Special Requests   Final    BOTTLES DRAWN AEROBIC AND  ANAEROBIC Blood Culture adequate volume Performed at [facility], [address] [place], [place] [address]    Culture   Final    NO GROWTH 5 DAYS Performed at [facility], [address], [place], [place] [address]    Report Status 06/03/2023 FINAL  Final  Blood culture (routine x 2)     Status: None   Collection Time: 05/29/23  5:42 AM   Specimen: BLOOD  Result Value Ref Range Status   Specimen Description   Final    BLOOD LEFT ANTECUBITAL Performed at [facility], [address] [place], [place] [address]    Special Requests   Final    BOTTLES DRAWN AEROBIC AND ANAEROBIC Blood Culture adequate volume Performed at [facility], [address], [place], [place] [address]    Culture   Final    NO GROWTH 5 DAYS Performed at [facility], [address] [place], [place] [address]    Report Status 06/03/2023 FINAL  Final   Imaging  DG CHEST PORT 1 VIEW Result Date: 01/21/2024 EXAM: 1 VIEW(S) XRAY OF THE CHEST 01/21/2024 12:30:40 PM COMPARISON: 01/11/2024 CLINICAL HISTORY: Status post peripherally inserted central catheter (PICC) central line placement FINDINGS: LINES, TUBES AND DEVICES: Stable right arm PICC line with tip in expected position of the proximal SVC. LUNGS AND PLEURA: No focal pulmonary opacity. No pleural effusion. No pneumothorax. HEART AND MEDIASTINUM: No acute abnormality of the cardiac and mediastinal silhouettes. BONES AND SOFT TISSUES: No acute osseous abnormality. IMPRESSION: 1. Stable right arm PICC line with tip in expected position of the proximal SVC. Electronically signed by: Katheleen Faes MD 01/21/2024 01:06 PM EST RP Workstation: [id number]   DG Chest 2 View Result Date: 01/11/2024 CLINICAL DATA:  PICC line placement. EXAM: CHEST - 2 VIEW COMPARISON:  05/21/2020 FINDINGS: Right-sided PICC line has tip over the SVC. Lungs are adequately inflated and otherwise clear. Cardiomediastinal  silhouette and remainder of the exam is unchanged. IMPRESSION: 1. No active cardiopulmonary disease. 2. Right-sided PICC line with tip over the SVC. Electronically Signed   By: Toribio Agreste M.D.   On: 01/11/2024 09:50    Assessment/Plan # h/o Facial cellulitis/abscess, MRSA related   # Nasal osteomyelitis/chronic nasal wound  - clinically improved but she is concerned about if it has come back although clinically unlikely   Plan - Po doxycycline  100mg  po bid for 7 days then stop  - Decolonization methods with mupirocin , bleach bath discussed to start  after doxycycline  course completed  - Discussed to see dermatology   # Medication Monitoring - 1/9 CBC and CMP Unremarkable, ESR 2, CRP <1  # Midline  - she reports midline fell out  - will have HH confirm  I personally spent a total of 30 minutes in the care of the patient today including reviewing ED notes 12/16, 12/26 and 1/2 preparing to see the patient, getting/reviewing separately obtained history, performing a medically appropriate exam/evaluation, counseling and educating, documenting clinical information in the EHR, independently interpreting results, communicating results, and coordinating care  Annalee Joseph, MD [facility] for Infectious Disease [facility] 02/08/2024, 2:28 PM  "

## 2024-02-08 NOTE — Patient Instructions (Signed)
 Decolonization methods ?Nasal decolonization with mupirocin  ointment (2%) applied to nares twice daily for 5 to 10 days, and ?Topical body decolonization (one of the following): Chlorhexidine  gluconate (2% or 4% solution): daily washes or use of a disposable impregnated cloth for 5 to 14 days, or Dilute bleach baths (one teaspoon bleach per gallon of water, or one-fourth cup bleach per one-fourth tub [approximately 13 gallons of water] for 15 minutes twice weekly) for approximately three months
